# Patient Record
Sex: Male | Born: 1950 | Race: White | Hispanic: No | Marital: Married | Smoking: Former smoker
Health system: Southern US, Community
[De-identification: ages and names within clinical notes are randomized; demographics above are authoritative.]

## PROBLEM LIST (undated history)

## (undated) DIAGNOSIS — E782 Mixed hyperlipidemia: Secondary | ICD-10-CM

## (undated) DIAGNOSIS — N200 Calculus of kidney: Secondary | ICD-10-CM

## (undated) DIAGNOSIS — I2510 Atherosclerotic heart disease of native coronary artery without angina pectoris: Secondary | ICD-10-CM

## (undated) DIAGNOSIS — N183 Chronic kidney disease, stage 3 unspecified: Secondary | ICD-10-CM

## (undated) DIAGNOSIS — I213 ST elevation (STEMI) myocardial infarction of unspecified site: Secondary | ICD-10-CM

## (undated) DIAGNOSIS — R569 Unspecified convulsions: Secondary | ICD-10-CM

## (undated) DIAGNOSIS — E119 Type 2 diabetes mellitus without complications: Secondary | ICD-10-CM

## (undated) DIAGNOSIS — I214 Non-ST elevation (NSTEMI) myocardial infarction: Secondary | ICD-10-CM

## (undated) HISTORY — DX: Mixed hyperlipidemia: E78.2

## (undated) HISTORY — DX: Unspecified convulsions: R56.9

## (undated) HISTORY — DX: Calculus of kidney: N20.0

## (undated) HISTORY — DX: Chronic kidney disease, stage 3 unspecified: N18.30

## (undated) HISTORY — DX: Atherosclerotic heart disease of native coronary artery without angina pectoris: I25.10

## (undated) HISTORY — DX: ST elevation (STEMI) myocardial infarction of unspecified site: I21.3

## (undated) HISTORY — PX: CORONARY ANGIOPLASTY WITH STENT PLACEMENT: SHX49

## (undated) SURGERY — CORONARY ARTERY BYPASS GRAFTING (CABG)
Anesthesia: General | Site: Chest

---

## 1999-10-01 ENCOUNTER — Inpatient Hospital Stay (HOSPITAL_COMMUNITY): Admission: AD | Admit: 1999-10-01 | Discharge: 1999-10-03 | Payer: Self-pay | Admitting: Cardiology

## 1999-10-01 ENCOUNTER — Encounter: Payer: Self-pay | Admitting: Cardiology

## 1999-10-20 ENCOUNTER — Encounter (HOSPITAL_COMMUNITY): Admission: RE | Admit: 1999-10-20 | Discharge: 2000-01-18 | Payer: Self-pay | Admitting: Cardiology

## 2005-01-19 ENCOUNTER — Ambulatory Visit: Payer: Self-pay | Admitting: Internal Medicine

## 2005-10-29 ENCOUNTER — Ambulatory Visit: Payer: Self-pay | Admitting: Internal Medicine

## 2005-12-10 ENCOUNTER — Ambulatory Visit: Payer: Self-pay | Admitting: Internal Medicine

## 2007-09-11 DIAGNOSIS — I25810 Atherosclerosis of coronary artery bypass graft(s) without angina pectoris: Secondary | ICD-10-CM

## 2007-09-11 DIAGNOSIS — Z951 Presence of aortocoronary bypass graft: Secondary | ICD-10-CM | POA: Insufficient documentation

## 2007-09-11 DIAGNOSIS — E1169 Type 2 diabetes mellitus with other specified complication: Secondary | ICD-10-CM | POA: Insufficient documentation

## 2007-09-11 DIAGNOSIS — E785 Hyperlipidemia, unspecified: Secondary | ICD-10-CM

## 2008-12-16 ENCOUNTER — Telehealth (INDEPENDENT_AMBULATORY_CARE_PROVIDER_SITE_OTHER): Payer: Self-pay | Admitting: *Deleted

## 2008-12-26 ENCOUNTER — Ambulatory Visit: Payer: Self-pay | Admitting: Internal Medicine

## 2008-12-26 DIAGNOSIS — H918X9 Other specified hearing loss, unspecified ear: Secondary | ICD-10-CM

## 2008-12-31 LAB — CONVERTED CEMR LAB
ALT: 40 units/L (ref 0–53)
AST: 30 units/L (ref 0–37)
Albumin: 4.1 g/dL (ref 3.5–5.2)
Alkaline Phosphatase: 69 units/L (ref 39–117)
BUN: 12 mg/dL (ref 6–23)
Bilirubin, Direct: 0.1 mg/dL (ref 0.0–0.3)
CO2: 27 meq/L (ref 19–32)
Calcium: 9.6 mg/dL (ref 8.4–10.5)
Chloride: 103 meq/L (ref 96–112)
Cholesterol: 123 mg/dL (ref 0–200)
Creatinine, Ser: 1 mg/dL (ref 0.4–1.5)
Direct LDL: 59.3 mg/dL
GFR calc Af Amer: 99 mL/min
GFR calc non Af Amer: 82 mL/min
Glucose, Bld: 103 mg/dL — ABNORMAL HIGH (ref 70–99)
HDL: 24.8 mg/dL — ABNORMAL LOW (ref >39.0)
Potassium: 3.8 meq/L (ref 3.5–5.1)
Sodium: 142 meq/L (ref 135–145)
Total Bilirubin: 0.9 mg/dL (ref 0.3–1.2)
Total CHOL/HDL Ratio: 5
Total Protein: 7.1 g/dL (ref 6.0–8.3)
Triglycerides: 305 mg/dL (ref 0–149)
VLDL: 61 mg/dL — ABNORMAL HIGH (ref 0–40)

## 2009-01-08 ENCOUNTER — Ambulatory Visit: Payer: Self-pay | Admitting: Internal Medicine

## 2009-01-15 ENCOUNTER — Ambulatory Visit: Payer: Self-pay | Admitting: Internal Medicine

## 2009-01-27 ENCOUNTER — Ambulatory Visit: Payer: Self-pay | Admitting: Internal Medicine

## 2010-07-06 ENCOUNTER — Ambulatory Visit: Payer: Self-pay | Admitting: Internal Medicine

## 2010-07-06 LAB — CONVERTED CEMR LAB
ALT: 27 units/L (ref 0–53)
AST: 19 units/L (ref 0–37)
Albumin: 4 g/dL (ref 3.5–5.2)
Alkaline Phosphatase: 70 units/L (ref 39–117)
BUN: 13 mg/dL (ref 6–23)
Basophils Absolute: 0 10*3/uL (ref 0.0–0.1)
Basophils Relative: 0.5 % (ref 0.0–3.0)
Bilirubin Urine: NEGATIVE
Bilirubin, Direct: 0.2 mg/dL (ref 0.0–0.3)
Blood in Urine, dipstick: NEGATIVE
CO2: 31 meq/L (ref 19–32)
Calcium: 9.2 mg/dL (ref 8.4–10.5)
Chloride: 107 meq/L (ref 96–112)
Cholesterol: 174 mg/dL (ref 0–200)
Creatinine, Ser: 0.9 mg/dL (ref 0.4–1.5)
Direct LDL: 69.5 mg/dL
Eosinophils Absolute: 0.4 10*3/uL (ref 0.0–0.7)
Eosinophils Relative: 6.7 % — ABNORMAL HIGH (ref 0.0–5.0)
GFR calc non Af Amer: 93.35 mL/min (ref >60)
Glucose, Bld: 120 mg/dL — ABNORMAL HIGH (ref 70–99)
Glucose, Urine, Semiquant: NEGATIVE
HCT: 49.9 % (ref 39.0–52.0)
HDL: 36.1 mg/dL — ABNORMAL LOW (ref >39.00)
Hemoglobin: 17.1 g/dL — ABNORMAL HIGH (ref 13.0–17.0)
Ketones, urine, test strip: NEGATIVE
Lymphocytes Relative: 23.7 % (ref 12.0–46.0)
Lymphs Abs: 1.6 10*3/uL (ref 0.7–4.0)
MCHC: 34.2 g/dL (ref 30.0–36.0)
MCV: 90.3 fL (ref 78.0–100.0)
Monocytes Absolute: 0.5 10*3/uL (ref 0.1–1.0)
Monocytes Relative: 7.9 % (ref 3.0–12.0)
Neutro Abs: 4 10*3/uL (ref 1.4–7.7)
Neutrophils Relative %: 61.2 % (ref 43.0–77.0)
Nitrite: NEGATIVE
PSA: 3.59 ng/mL (ref 0.10–4.00)
Platelets: 249 10*3/uL (ref 150.0–400.0)
Potassium: 4.8 meq/L (ref 3.5–5.1)
Protein, U semiquant: NEGATIVE
RBC: 5.52 M/uL (ref 4.22–5.81)
RDW: 12.8 % (ref 11.5–14.6)
Sodium: 141 meq/L (ref 135–145)
Specific Gravity, Urine: >=1.03
TSH: 3.14 microintl units/mL (ref 0.35–5.50)
Total Bilirubin: 0.7 mg/dL (ref 0.3–1.2)
Total CHOL/HDL Ratio: 5
Total Protein: 7.1 g/dL (ref 6.0–8.3)
Triglycerides: 561 mg/dL — ABNORMAL HIGH (ref 0.0–149.0)
Urobilinogen, UA: 0.2
VLDL: 112.2 mg/dL — ABNORMAL HIGH (ref 0.0–40.0)
WBC Urine, dipstick: NEGATIVE
WBC: 6.6 10*3/uL (ref 4.5–10.5)
pH: 5

## 2010-07-13 ENCOUNTER — Ambulatory Visit: Payer: Self-pay | Admitting: Internal Medicine

## 2011-01-19 NOTE — Assessment & Plan Note (Signed)
Summary: cpx/ccm   Vital Signs:  Patient profile:   60 year old male Height:      68 inches Weight:      209 pounds BMI:     31.89 Temp:     98.7 degrees F oral Pulse rate:   66 / minute Pulse rhythm:   regular BP sitting:   120 / 88  (left arm)  Vitals Entered By: Kern Reap CMA Duncan Dull) (July 13, 2010 8:01 AM)  Nutrition Counseling: [name] BMI is greater than 25 and therefore counseled on weight management options. CC: yearly physical Is Patient Diabetic? No Pain Assessment Patient in pain? no        CC:  yearly physical.  History of Present Illness: CPX  Preventive Screening-Counseling & Management  Alcohol-Tobacco     Smoking Status: quit  Current Problems (verified): 1)  Other Specified Forms of Hearing Loss  (ICD-389.8) 2)  Hyperlipidemia  (ICD-272.4) 3)  Coronary Artery Disease  (ICD-414.00) 4)  Family History of Aneurysm Aortic  (ICD-V17.4)  Current Medications (verified): 1)  Vytorin 10-80 Mg Tabs (Ezetimibe-Simvastatin) .... Take 1 Tablet By Mouth At Bedtime 2)  Bayer Aspirin 325 Mg  Tabs (Aspirin) .... Once Daily  Allergies (verified): No Known Drug Allergies  Past History:  Past Medical History: Last updated: 09/11/2007 Kidney Stones Coronary artery disease Hyperlipidemia  Past Surgical History: Last updated: 09/11/2007 Angioplasty  Family History: Last updated: 12/26/2008 Family History of Aneurysm Aortic father deceased (knife wound) mother MVA  Social History: Last updated: 07/13/2010 Occupation: carpenter Married  Smoker (quit 3009) Alcohol use-no Former Smoker  Risk Factors: Smoking Status: quit (07/13/2010)  Social History: Occupation: Music therapist Married  Smoker (quit 3009) Alcohol use-no Former Smoker Smoking Status:  quit  Physical Exam  General:  alert and well-developed.   Head:  normocephalic and atraumatic.   Eyes:  pupils equal and pupils round.   Ears:  R ear normal and L ear normal.   Nose:  no  external deformity and no external erythema.   Mouth:  good dentition and pharynx pink and moist.   Neck:  No deformities, masses, or tenderness noted. Chest Wall:  No deformities, masses, tenderness or gynecomastia noted. Lungs:  normal respiratory effort and no intercostal retractions.   Heart:  normal rate and regular rhythm.   Abdomen:  soft and non-tender.   Prostate:  no nodules and no asymmetry.   Msk:  No deformity or scoliosis noted of thoracic or lumbar spine.   Extremities:  trace left pedal edema.   Neurologic:  cranial nerves II-XII intact and gait normal.     Impression & Recommendations:  Problem # 1:  PREVENTIVE HEALTH CARE (ICD-V70.0) no insurance---stool cards for colon CA screening  Problem # 2:  HYPERLIPIDEMIA (ICD-272.4) adequate control continue current medications  His updated medication list for this problem includes:    Vytorin 10-80 Mg Tabs (Ezetimibe-simvastatin) .Marland Kitchen... Take 1 tablet by mouth at bedtime  Labs Reviewed: SGOT: 19 (07/06/2010)   SGPT: 27 (07/06/2010)   HDL:36.10 (07/06/2010), 24.8 (12/26/2008)  LDL:DEL (12/26/2008)  Chol:174 (07/06/2010), 123 (12/26/2008)  Trig:561.0 Triglyceride is over 400; calculations on Lipids are invalid. mg/dL (16/09/9603), 540 (98/10/9146)  Problem # 3:  CORONARY ARTERY DISEASE (ICD-414.00) no sxs continue current medications  His updated medication list for this problem includes:    Bayer Aspirin 325 Mg Tabs (Aspirin) ..... Once daily  Complete Medication List: 1)  Vytorin 10-80 Mg Tabs (Ezetimibe-simvastatin) .... Take 1 tablet by mouth at bedtime 2)  Bayer  Aspirin 325 Mg Tabs (Aspirin) .... Once daily  Other Orders: Tdap => 56yrs IM (16109) Admin 1st Vaccine (60454)  Contraindications/Deferment of Procedures/Staging:    Test/Procedure: Colonoscopy    Reason for deferment: declined-financial    Immunizations Administered:  Tetanus Vaccine:    Vaccine Type: Tdap    Site: left deltoid    Mfr:  GlaxoSmithKline    Dose: 0.5 ml    Route: IM    Given by: Kern Reap CMA (AAMA)    Exp. Date: 01/08/2012    Lot #: [id number]    Physician counseled: yes

## 2011-02-12 ENCOUNTER — Other Ambulatory Visit: Payer: Self-pay | Admitting: Internal Medicine

## 2012-01-04 ENCOUNTER — Other Ambulatory Visit: Payer: Self-pay | Admitting: Internal Medicine

## 2012-01-05 ENCOUNTER — Telehealth: Payer: Self-pay | Admitting: Internal Medicine

## 2012-01-05 MED ORDER — EZETIMIBE-SIMVASTATIN 10-80 MG PO TABS: 1.0000 | ORAL_TABLET | Freq: Every day | ORAL | Status: DC

## 2012-01-05 NOTE — Telephone Encounter (Signed)
rx sent in electronically 

## 2012-01-05 NOTE — Telephone Encounter (Signed)
Refill Vytorin until his appt next week.   [facility]. Thanks.

## 2012-01-06 ENCOUNTER — Ambulatory Visit: Payer: Self-pay | Admitting: Internal Medicine

## 2012-01-12 ENCOUNTER — Encounter: Payer: Self-pay | Admitting: Internal Medicine

## 2012-01-12 ENCOUNTER — Ambulatory Visit (INDEPENDENT_AMBULATORY_CARE_PROVIDER_SITE_OTHER): Payer: Self-pay | Admitting: Internal Medicine

## 2012-01-12 DIAGNOSIS — I2510 Atherosclerotic heart disease of native coronary artery without angina pectoris: Secondary | ICD-10-CM

## 2012-01-12 DIAGNOSIS — E785 Hyperlipidemia, unspecified: Secondary | ICD-10-CM

## 2012-01-12 LAB — BASIC METABOLIC PANEL
BUN: 15 mg/dL (ref 6–23)
CO2: 26 mEq/L (ref 19–32)
Chloride: 106 mEq/L (ref 96–112)
Creatinine, Ser: 1 mg/dL (ref 0.4–1.5)
Glucose, Bld: 133 mg/dL — ABNORMAL HIGH (ref 70–99)

## 2012-01-12 LAB — LIPID PANEL
Cholesterol: 140 mg/dL (ref 0–200)
HDL: 34.3 mg/dL — ABNORMAL LOW (ref >39.00)
Total CHOL/HDL Ratio: 4
Triglycerides: 278 mg/dL — ABNORMAL HIGH (ref 0.0–149.0)
VLDL: 55.6 mg/dL — ABNORMAL HIGH (ref 0.0–40.0)

## 2012-01-12 LAB — HEPATIC FUNCTION PANEL
ALT: 25 U/L (ref 0–53)
AST: 20 U/L (ref 0–37)
Albumin: 4.2 g/dL (ref 3.5–5.2)
Alkaline Phosphatase: 75 U/L (ref 39–117)
Bilirubin, Direct: 0.1 mg/dL (ref 0.0–0.3)
Total Bilirubin: 0.9 mg/dL (ref 0.3–1.2)
Total Protein: 7 g/dL (ref 6.0–8.3)

## 2012-01-12 NOTE — Assessment & Plan Note (Signed)
No sxs Continue current meds---risk factor modification

## 2012-01-12 NOTE — Assessment & Plan Note (Signed)
Needs labwork

## 2012-01-17 ENCOUNTER — Encounter: Payer: Self-pay | Admitting: *Deleted

## 2012-01-18 NOTE — Progress Notes (Signed)
Patient ID: Adrian Foley, male   DOB: 07-24-1951, 61 y.o.   MRN: [id number] Cad---no cp, sob, pnd, orhtopnea He is physically active with work  Past Medical History  Diagnosis Date  . Kidney stones   . CAD (coronary artery disease)   . Hyperlipidemia     History   Social History  . Marital Status: Single    Spouse Name: N/A    Number of Children: N/A  . Years of Education: N/A   Occupational History  . Not on file.   Social History Main Topics  . Smoking status: Former Smoker    Quit date: 12/21/2007  . Smokeless tobacco: Not on file  . Alcohol Use: No  . Drug Use: No  . Sexually Active:    Other Topics Concern  . Not on file   Social History Narrative  . No narrative on file    Past Surgical History  Procedure Date  . Angioplasty     Family History  Problem Relation Age of Onset  . Heart disease Maternal Aunt     No Known Allergies  No current outpatient prescriptions on file prior to visit.     patient denies chest pain, shortness of breath, orthopnea. Denies lower extremity edema, abdominal pain, change in appetite, change in bowel movements. Patient denies rashes, musculoskeletal complaints. No other specific complaints in a complete review of systems.   BP 132/90  Pulse 64  Temp(Src) 98.1 F (36.7 C) (Oral)  Ht 5\' 9"  (1.753 m)  Wt 207 lb (93.895 kg)  BMI 30.57 kg/m2  well-developed well-nourished male in no acute distress. HEENT exam atraumatic, normocephalic, neck supple without jugular venous distention. Chest clear to auscultation cardiac exam S1-S2 are regular. Abdominal exam overweight with bowel sounds, soft and nontender. Extremities no edema. Neurologic exam is alert with a normal gait.

## 2012-03-16 ENCOUNTER — Other Ambulatory Visit: Payer: Self-pay | Admitting: Internal Medicine

## 2012-05-23 ENCOUNTER — Other Ambulatory Visit: Payer: Self-pay | Admitting: Internal Medicine

## 2012-07-26 ENCOUNTER — Other Ambulatory Visit: Payer: Self-pay | Admitting: Internal Medicine

## 2012-09-28 ENCOUNTER — Other Ambulatory Visit: Payer: Self-pay | Admitting: Internal Medicine

## 2012-12-01 ENCOUNTER — Other Ambulatory Visit: Payer: Self-pay | Admitting: Internal Medicine

## 2013-01-29 ENCOUNTER — Other Ambulatory Visit: Payer: Self-pay | Admitting: Internal Medicine

## 2013-04-08 ENCOUNTER — Other Ambulatory Visit: Payer: Self-pay | Admitting: Internal Medicine

## 2013-04-13 ENCOUNTER — Other Ambulatory Visit: Payer: Self-pay | Admitting: Internal Medicine

## 2013-06-13 ENCOUNTER — Other Ambulatory Visit: Payer: Self-pay | Admitting: Internal Medicine

## 2013-06-15 ENCOUNTER — Other Ambulatory Visit: Payer: Self-pay | Admitting: Internal Medicine

## 2013-08-29 ENCOUNTER — Other Ambulatory Visit: Payer: Self-pay | Admitting: Internal Medicine

## 2013-09-01 ENCOUNTER — Other Ambulatory Visit: Payer: Self-pay | Admitting: Internal Medicine

## 2013-11-07 ENCOUNTER — Other Ambulatory Visit: Payer: Self-pay | Admitting: Internal Medicine

## 2013-11-09 ENCOUNTER — Other Ambulatory Visit: Payer: Self-pay | Admitting: Internal Medicine

## 2013-11-14 ENCOUNTER — Other Ambulatory Visit: Payer: Self-pay | Admitting: Internal Medicine

## 2013-11-16 ENCOUNTER — Ambulatory Visit (INDEPENDENT_AMBULATORY_CARE_PROVIDER_SITE_OTHER): Payer: Self-pay | Admitting: Internal Medicine

## 2013-11-16 ENCOUNTER — Encounter: Payer: Self-pay | Admitting: Internal Medicine

## 2013-11-16 VITALS — BP 132/86 | HR 55 | Temp 97.8°F | Resp 20 | Wt 203.0 lb

## 2013-11-16 DIAGNOSIS — Z0000 Encounter for general adult medical examination without abnormal findings: Secondary | ICD-10-CM

## 2013-11-16 DIAGNOSIS — R7309 Other abnormal glucose: Secondary | ICD-10-CM

## 2013-11-16 DIAGNOSIS — R739 Hyperglycemia, unspecified: Secondary | ICD-10-CM

## 2013-11-16 DIAGNOSIS — H918X9 Other specified hearing loss, unspecified ear: Secondary | ICD-10-CM

## 2013-11-16 DIAGNOSIS — I2510 Atherosclerotic heart disease of native coronary artery without angina pectoris: Secondary | ICD-10-CM

## 2013-11-16 DIAGNOSIS — E785 Hyperlipidemia, unspecified: Secondary | ICD-10-CM

## 2013-11-16 LAB — HEPATIC FUNCTION PANEL
ALT: 26 U/L (ref 0–53)
AST: 15 U/L (ref 0–37)
Albumin: 3.7 g/dL (ref 3.5–5.2)
Alkaline Phosphatase: 71 U/L (ref 39–117)

## 2013-11-16 LAB — CBC WITH DIFFERENTIAL/PLATELET
Basophils Relative: 0.8 % (ref 0.0–3.0)
Eosinophils Absolute: 0.3 10*3/uL (ref 0.0–0.7)
HCT: 50.3 % (ref 39.0–52.0)
Hemoglobin: 17 g/dL (ref 13.0–17.0)
Lymphocytes Relative: 25.1 % (ref 12.0–46.0)
Lymphs Abs: 1.4 10*3/uL (ref 0.7–4.0)
MCHC: 33.8 g/dL (ref 30.0–36.0)
MCV: 89.4 fl (ref 78.0–100.0)
Monocytes Absolute: 0.6 10*3/uL (ref 0.1–1.0)
Neutro Abs: 3.2 10*3/uL (ref 1.4–7.7)
RBC: 5.64 Mil/uL (ref 4.22–5.81)
RDW: 12.4 % (ref 11.5–14.6)
WBC: 5.5 10*3/uL (ref 4.5–10.5)

## 2013-11-16 LAB — LDL CHOLESTEROL, DIRECT: Direct LDL: 81.9 mg/dL

## 2013-11-16 LAB — LIPID PANEL: VLDL: 56.6 mg/dL — ABNORMAL HIGH (ref 0.0–40.0)

## 2013-11-16 LAB — CK: Total CK: 57 U/L (ref 7–232)

## 2013-11-16 LAB — BASIC METABOLIC PANEL
BUN: 14 mg/dL (ref 6–23)
Chloride: 105 mEq/L (ref 96–112)
Glucose, Bld: 173 mg/dL — ABNORMAL HIGH (ref 70–99)
Potassium: 4 mEq/L (ref 3.5–5.1)

## 2013-11-16 LAB — TSH: TSH: 2.42 u[IU]/mL (ref 0.35–5.50)

## 2013-11-16 NOTE — Progress Notes (Signed)
Pre-visit discussion using our clinic review tool. No additional management support is needed unless otherwise documented below in the visit note.  

## 2013-11-16 NOTE — Progress Notes (Signed)
CAD- no sxs  Lipids- tolerating meds  May have some diffuse "aches and pains"- he attributes to "old age"  Reviewed pmh, psh, meds  Ros- no compliants except for above  Exam BP 132/86  Pulse 55  Temp(Src) 97.8 F (36.6 C) (Oral)  Resp 20  Wt 203 lb (92.08 kg)  SpO2 98%  well-developed well-nourished male in no acute distress. HEENT exam atraumatic, normocephalic, neck supple without jugular venous distention. Chest clear to auscultation cardiac exam S1-S2 are regular. Abdominal exam overweight with bowel sounds, soft and nontender. Extremities no edema. Neurologic exam is alert with a normal gait.

## 2013-11-18 NOTE — Assessment & Plan Note (Signed)
Continue meds Needs f/u labs

## 2013-11-18 NOTE — Assessment & Plan Note (Signed)
No sxs- continue risk factor modification

## 2013-11-20 ENCOUNTER — Other Ambulatory Visit: Payer: Self-pay | Admitting: Internal Medicine

## 2013-11-20 DIAGNOSIS — E119 Type 2 diabetes mellitus without complications: Secondary | ICD-10-CM

## 2013-11-20 MED ORDER — METFORMIN HCL 500 MG PO TABS: 500.0000 mg | ORAL_TABLET | Freq: Two times a day (BID) | ORAL | Status: DC

## 2013-12-04 ENCOUNTER — Other Ambulatory Visit: Payer: Self-pay | Admitting: *Deleted

## 2013-12-04 MED ORDER — EZETIMIBE-SIMVASTATIN 10-80 MG PO TABS: ORAL_TABLET | ORAL | Status: DC

## 2013-12-20 LAB — HM DIABETES EYE EXAM

## 2014-01-29 ENCOUNTER — Encounter: Payer: Self-pay | Admitting: Internal Medicine

## 2014-02-18 ENCOUNTER — Encounter: Payer: Self-pay | Admitting: Internal Medicine

## 2014-02-18 DIAGNOSIS — E1159 Type 2 diabetes mellitus with other circulatory complications: Secondary | ICD-10-CM | POA: Insufficient documentation

## 2014-02-18 LAB — HM DIABETES FOOT EXAM

## 2014-02-18 NOTE — Progress Notes (Signed)
Patient comes in with known coronary artery disease. He has history of angioplasty. He has no chest pain, shortness of breath, PND, orthopnea. No dyspnea on exertion.  Patient is hyperlipidemia tolerating Vytorin.   Patient also has type 2 diabetes and is tolerating metformin. Lab Results  Component Value Date   HGBA1C 7.9* 11/16/2013    Past Medical History  Diagnosis Date  . Kidney stones   . CAD (coronary artery disease)   . Hyperlipidemia     History   Social History  . Marital Status: Single    Spouse Name: N/A    Number of Children: N/A  . Years of Education: N/A   Occupational History  . Not on file.   Social History Main Topics  . Smoking status: Former Smoker    Quit date: 12/21/2007  . Smokeless tobacco: Not on file  . Alcohol Use: No  . Drug Use: No  . Sexual Activity:    Other Topics Concern  . Not on file   Social History Narrative  . No narrative on file    Past Surgical History  Procedure Laterality Date  . Angioplasty      Family History  Problem Relation Age of Onset  . Heart disease Maternal Aunt     No Known Allergies  Current Outpatient Prescriptions on File Prior to Visit  Medication Sig Dispense Refill  . aspirin 325 MG tablet Take 325 mg by mouth daily.      Marland Kitchen. ezetimibe-simvastatin (VYTORIN) 10-80 MG per tablet TAKE 1 TABLET BY MOUTH AT BEDTIME  30 tablet  11  . metFORMIN (GLUCOPHAGE) 500 MG tablet Take 1 tablet (500 mg total) by mouth 2 (two) times daily with a meal.  180 tablet  3   No current facility-administered medications on file prior to visit.     patient denies chest pain, shortness of breath, orthopnea. Denies lower extremity edema, abdominal pain, change in appetite, change in bowel movements. Patient denies rashes, musculoskeletal complaints. No other specific complaints in a complete review of systems.   Reviewed vitals  well-developed well-nourished male in no acute distress. HEENT exam atraumatic, normocephalic,  neck supple without jugular venous distention. Chest clear to auscultation cardiac exam S1-S2 are regular. Abdominal exam overweight with bowel sounds, soft and nontender. Extremities no edema. Neurologic exam is alert with a normal gait.  This encounter was created in error - please disregard.

## 2014-03-21 ENCOUNTER — Ambulatory Visit (INDEPENDENT_AMBULATORY_CARE_PROVIDER_SITE_OTHER): Payer: Self-pay | Admitting: Family Medicine

## 2014-03-21 ENCOUNTER — Encounter: Payer: Self-pay | Admitting: Family Medicine

## 2014-03-21 VITALS — BP 130/80 | Temp 98.2°F | Wt 184.0 lb

## 2014-03-21 DIAGNOSIS — K4090 Unilateral inguinal hernia, without obstruction or gangrene, not specified as recurrent: Secondary | ICD-10-CM

## 2014-03-21 NOTE — Progress Notes (Addendum)
Chief Complaint  Patient presents with  . Groin Pain    possible hernia?    HPI:  ? Hernia: -R groin pain and bulge -started last week after heavy lifting at work -recurs with strenuous activity and lifting -reducible bulge in R groin and interminttent pain -denies: abd pain, bowel changes, hematochezia, urinary symptoms, testicular pain or swelling ROS: See pertinent positives and negatives per HPI.  Past Medical History  Diagnosis Date  . Kidney stones   . CAD (coronary artery disease)   . Hyperlipidemia     Past Surgical History  Procedure Laterality Date  . Angioplasty      Family History  Problem Relation Age of Onset  . Heart disease Maternal Aunt     History   Social History  . Marital Status: Single    Spouse Name: N/A    Number of Children: N/A  . Years of Education: N/A   Social History Main Topics  . Smoking status: Former Smoker    Quit date: 12/21/2007  . Smokeless tobacco: None  . Alcohol Use: No  . Drug Use: No  . Sexual Activity:    Other Topics Concern  . None   Social History Narrative  . None    Current outpatient prescriptions:aspirin 325 MG tablet, Take 325 mg by mouth daily., Disp: , Rfl:   EXAM:  Filed Vitals:   03/21/14 0905  BP: 130/80  Temp: 98.2 F (36.8 C)    Body mass index is 27.16 kg/(m^2).  GENERAL: vitals reviewed and listed above, alert, oriented, appears well hydrated and in no acute distress  HEENT: atraumatic, conjunttiva clear, no obvious abnormalities on inspection of external nose and ears  NECK: no obvious masses on inspection  GU: reducible bulge R groin, normal otherwise  MS: moves all extremities without noticeable abnormality  PSYCH: pleasant and cooperative, no obvious depression or anxiety  ASSESSMENT AND PLAN:  Discussed the following assessment and plan:  Inguinal hernia - Plan: Ambulatory referral to General Surgery  -discussed implications, options, return and emergent  precautions -he would like to see surgeon asap for eval and tx given work limitations related to this issue -Patient advised to return or notify a doctor immediately if symptoms worsen or persist or new concerns arise.  Patient Instructions  Inguinal Hernia, Adult Muscles help keep everything in the body in its proper place. But if a weak spot in the muscles develops, something can poke through. That is called a hernia. When this happens in the lower part of the belly (abdomen), it is called an inguinal hernia. (It takes its name from a part of the body in this region called the inguinal canal.) A weak spot in the wall of muscles lets some fat or part of the small intestine bulge through. An inguinal hernia can develop at any age. Men get them more often than women. CAUSES  In adults, an inguinal hernia develops over time.  It can be triggered by:  Suddenly straining the muscles of the lower abdomen.  Lifting heavy objects.  Straining to have a bowel movement. Difficult bowel movements (constipation) can lead to this.  Constant coughing. This may be caused by smoking or lung disease.  Being overweight.  Being pregnant.  Working at a job that requires long periods of standing or heavy lifting.  Having had an inguinal hernia before. One type can be an emergency situation. It is called a strangulated inguinal hernia. It develops if part of the small intestine slips through the  weak spot and cannot get back into the abdomen. The blood supply can be cut off. If that happens, part of the intestine may die. This situation requires emergency surgery. SYMPTOMS  Often, a small inguinal hernia has no symptoms. It is found when a healthcare provider does a physical exam. Larger hernias usually have symptoms.   In adults, symptoms may include:  A lump in the groin. This is easier to see when the person is standing. It might disappear when lying down.  In men, a lump in the scrotum.  Pain or  burning in the groin. This occurs especially when lifting, straining or coughing.  A dull ache or feeling of pressure in the groin.  Signs of a strangulated hernia can include:  A bulge in the groin that becomes very painful and tender to the touch.  A bulge that turns red or purple.  Fever, nausea and vomiting.  Inability to have a bowel movement or to pass gas. DIAGNOSIS  To decide if you have an inguinal hernia, a healthcare provider will probably do a physical examination.  This will include asking questions about any symptoms you have noticed.  The healthcare provider might feel the groin area and ask you to cough. If an inguinal hernia is felt, the healthcare provider may try to slide it back into the abdomen.  Usually no other tests are needed. TREATMENT  Treatments can vary. The size of the hernia makes a difference. Options include:  Watchful waiting. This is often suggested if the hernia is small and you have had no symptoms.  No medical procedure will be done unless symptoms develop.  You will need to watch closely for symptoms. If any occur, contact your healthcare provider right away.  Surgery. This is used if the hernia is larger or you have symptoms.  Open surgery. This is usually an outpatient procedure (you will not stay overnight in a hospital). An cut (incision) is made through the skin in the groin. The hernia is put back inside the abdomen. The weak area in the muscles is then repaired by herniorrhaphy or hernioplasty. Herniorrhaphy: in this type of surgery, the weak muscles are sewn back together. Hernioplasty: a patch or mesh is used to close the weak area in the abdominal wall.  Laparoscopy. In this procedure, a surgeon makes small incisions. A thin tube with a tiny video camera (called a laparoscope) is put into the abdomen. The surgeon repairs the hernia with mesh by looking with the video camera and using two long instruments. HOME CARE INSTRUCTIONS    After surgery to repair an inguinal hernia:  You will need to take pain medicine prescribed by your healthcare provider. Follow all directions carefully.  You will need to take care of the wound from the incision.  Your activity will be restricted for awhile. This will probably include no heavy lifting for several weeks. You also should not do anything too active for a few weeks. When you can return to work will depend on the type of job that you have.  During "watchful waiting" periods, you should:  Maintain a healthy weight.  Eat a diet high in fiber (fruits, vegetables and whole grains).  Drink plenty of fluids to avoid constipation. This means drinking enough water and other liquids to keep your urine clear or pale yellow.  Do not lift heavy objects.  Do not stand for long periods of time.  Quit smoking. This should keep you from developing a frequent cough. SEEK MEDICAL  CARE IF:   A bulge develops in your groin area.  You feel pain, a burning sensation or pressure in the groin. This might be worse if you are lifting or straining.  You develop a fever of more than 100.5 F (38.1 C). SEEK IMMEDIATE MEDICAL CARE IF:   Pain in the groin increases suddenly.  A bulge in the groin gets bigger suddenly and does not go down.  For men, there is sudden pain in the scrotum. Or, the size of the scrotum increases.  A bulge in the groin area becomes red or purple and is painful to touch.  You have nausea or vomiting that does not go away.  You feel your heart beating much faster than normal.  You cannot have a bowel movement or pass gas.  You develop a fever of more than 102.0 F (38.9 C). Document Released: 04/24/2009 Document Revised: 02/28/2012 Document Reviewed: 04/24/2009 [facility] Patient Information 2014 [place], Lona Kettle, Dahlia Client R.

## 2014-03-21 NOTE — Progress Notes (Signed)
Pre visit review using our clinic review tool, if applicable. No additional management support is needed unless otherwise documented below in the visit note. 

## 2014-03-21 NOTE — Patient Instructions (Signed)

## 2014-04-05 ENCOUNTER — Ambulatory Visit (INDEPENDENT_AMBULATORY_CARE_PROVIDER_SITE_OTHER): Payer: Self-pay | Admitting: Surgery

## 2015-10-06 ENCOUNTER — Emergency Department (HOSPITAL_COMMUNITY): Payer: Self-pay

## 2015-10-06 ENCOUNTER — Encounter (HOSPITAL_COMMUNITY): Payer: Self-pay | Admitting: Emergency Medicine

## 2015-10-06 ENCOUNTER — Encounter (HOSPITAL_COMMUNITY): Payer: Self-pay | Admitting: Family Medicine

## 2015-10-06 ENCOUNTER — Emergency Department (HOSPITAL_COMMUNITY)
Admission: EM | Admit: 2015-10-06 | Discharge: 2015-10-06 | Disposition: A | Discharge status: Home / Self Care | Payer: Self-pay | Attending: Emergency Medicine | Admitting: Emergency Medicine

## 2015-10-06 DIAGNOSIS — Z87891 Personal history of nicotine dependence: Secondary | ICD-10-CM | POA: Insufficient documentation

## 2015-10-06 DIAGNOSIS — Z87442 Personal history of urinary calculi: Secondary | ICD-10-CM | POA: Insufficient documentation

## 2015-10-06 DIAGNOSIS — I2510 Atherosclerotic heart disease of native coronary artery without angina pectoris: Secondary | ICD-10-CM | POA: Insufficient documentation

## 2015-10-06 DIAGNOSIS — Z7982 Long term (current) use of aspirin: Secondary | ICD-10-CM | POA: Insufficient documentation

## 2015-10-06 DIAGNOSIS — Z8639 Personal history of other endocrine, nutritional and metabolic disease: Secondary | ICD-10-CM | POA: Insufficient documentation

## 2015-10-06 DIAGNOSIS — R109 Unspecified abdominal pain: Secondary | ICD-10-CM | POA: Insufficient documentation

## 2015-10-06 DIAGNOSIS — R112 Nausea with vomiting, unspecified: Secondary | ICD-10-CM | POA: Insufficient documentation

## 2015-10-06 DIAGNOSIS — R1031 Right lower quadrant pain: Secondary | ICD-10-CM | POA: Insufficient documentation

## 2015-10-06 LAB — CBC WITH DIFFERENTIAL/PLATELET
BASOS PCT: 0 %
Basophils Absolute: 0 10*3/uL (ref 0.0–0.1)
Eosinophils Absolute: 0.1 10*3/uL (ref 0.0–0.7)
Eosinophils Relative: 2 %
HEMATOCRIT: 48.7 % (ref 39.0–52.0)
HEMOGLOBIN: 17.3 g/dL — AB (ref 13.0–17.0)
LYMPHS ABS: 1 10*3/uL (ref 0.7–4.0)
Lymphocytes Relative: 11 %
MCH: 31.1 pg (ref 26.0–34.0)
MCHC: 35.5 g/dL (ref 30.0–36.0)
MCV: 87.6 fL (ref 78.0–100.0)
MONO ABS: 0.6 10*3/uL (ref 0.1–1.0)
MONOS PCT: 7 %
NEUTROS ABS: 7.1 10*3/uL (ref 1.7–7.7)
Neutrophils Relative %: 80 %
Platelets: 197 10*3/uL (ref 150–400)
RBC: 5.56 MIL/uL (ref 4.22–5.81)
RDW: 12.7 % (ref 11.5–15.5)
WBC: 8.9 10*3/uL (ref 4.0–10.5)

## 2015-10-06 LAB — URINALYSIS, ROUTINE W REFLEX MICROSCOPIC
BILIRUBIN URINE: NEGATIVE
Bilirubin Urine: NEGATIVE
GLUCOSE, UA: NEGATIVE mg/dL
Glucose, UA: NEGATIVE mg/dL
Hgb urine dipstick: NEGATIVE
Hgb urine dipstick: NEGATIVE
KETONES UR: 15 mg/dL — AB
Ketones, ur: NEGATIVE mg/dL
LEUKOCYTES UA: NEGATIVE
Leukocytes, UA: NEGATIVE
Nitrite: NEGATIVE
Nitrite: NEGATIVE
PH: 6 (ref 5.0–8.0)
PH: 5.5 (ref 5.0–8.0)
Protein, ur: NEGATIVE mg/dL
Protein, ur: NEGATIVE mg/dL
SPECIFIC GRAVITY, URINE: 1.022 (ref 1.005–1.030)
SPECIFIC GRAVITY, URINE: 1.018 (ref 1.005–1.030)
Urobilinogen, UA: 0.2 mg/dL (ref 0.0–1.0)
Urobilinogen, UA: 1 mg/dL (ref 0.0–1.0)

## 2015-10-06 LAB — BASIC METABOLIC PANEL
Anion gap: 10 (ref 5–15)
BUN: 15 mg/dL (ref 6–20)
CALCIUM: 9.2 mg/dL (ref 8.9–10.3)
CHLORIDE: 99 mmol/L — AB (ref 101–111)
CO2: 25 mmol/L (ref 22–32)
Creatinine, Ser: 0.89 mg/dL (ref 0.61–1.24)
GFR calc non Af Amer: >60 mL/min (ref >60)
GLUCOSE: 159 mg/dL — AB (ref 65–99)
Potassium: 4.4 mmol/L (ref 3.5–5.1)
Sodium: 134 mmol/L — ABNORMAL LOW (ref 135–145)

## 2015-10-06 MED ORDER — MORPHINE SULFATE (PF) 4 MG/ML IV SOLN
4.0000 mg | Freq: Once | INTRAVENOUS | Status: AC
  Administered 2015-10-06: 4 mg via INTRAVENOUS
  Filled 2015-10-06: qty 1

## 2015-10-06 MED ORDER — ONDANSETRON 4 MG PO TBDP: 4.0000 mg | ORAL_TABLET | Freq: Three times a day (TID) | ORAL | Status: DC | PRN

## 2015-10-06 MED ORDER — ONDANSETRON HCL 4 MG/2ML IJ SOLN
4.0000 mg | Freq: Once | INTRAMUSCULAR | Status: DC
  Filled 2015-10-06: qty 2

## 2015-10-06 MED ORDER — KETOROLAC TROMETHAMINE 30 MG/ML IJ SOLN
30.0000 mg | Freq: Once | INTRAMUSCULAR | Status: AC
  Administered 2015-10-06: 30 mg via INTRAVENOUS
  Filled 2015-10-06: qty 1

## 2015-10-06 MED ORDER — IBUPROFEN 800 MG PO TABS: 800.0000 mg | ORAL_TABLET | Freq: Three times a day (TID) | ORAL | Status: DC

## 2015-10-06 NOTE — ED Notes (Signed)
Pt here for right flank and abd pain that started last night. sts N,V. Hx of kidney stones

## 2015-10-06 NOTE — ED Notes (Signed)
MD at bedside. 

## 2015-10-06 NOTE — ED Notes (Signed)
Pt. refused blood tests at triage .  

## 2015-10-06 NOTE — ED Provider Notes (Signed)
CSN: [id number]     Arrival date & time 10/06/15  0708 History   First MD Initiated Contact with Patient 10/06/15 [phone]     Chief Complaint  Patient presents with  . Flank Pain     (Consider location/radiation/quality/duration/timing/severity/associated sxs/prior Treatment) HPI Comments: Pt is a 64 yo male with PMH of kidney stones and CAD who presents to the ED with complaint of right flank pain, onset 9pm last night. Pt reports having sudden onset constant sharp right flank pain. Denies any aggravating or alleviating factors. Endorses nausea and NBNB vomiting x2 this morning, he notes his N/V have since resolved. Denies fever, chills, HA, SOB, CP, abdominal pain, diarrhea, constipation, dysuria, blood in urine or stool. Pt reports having hx of kidney stones in the past and notes that this episode of pain feels similar to his prior kidney stones. Denies hx of abdominal surgeries.    Past Medical History  Diagnosis Date  . Kidney stones   . CAD (coronary artery disease)   . Hyperlipidemia    Past Surgical History  Procedure Laterality Date  . Angioplasty     Family History  Problem Relation Age of Onset  . Heart disease Maternal Aunt    Social History  Substance Use Topics  . Smoking status: Former Smoker    Quit date: 12/21/2007  . Smokeless tobacco: None  . Alcohol Use: No    Review of Systems  Gastrointestinal: Positive for nausea and vomiting.  Genitourinary: Positive for flank pain.  All other systems reviewed and are negative.     Allergies  Review of patient's allergies indicates no known allergies.  Home Medications   Prior to Admission medications   Medication Sig Start Date End Date Taking? Authorizing Provider  aspirin 325 MG tablet Take 325 mg by mouth daily.    Historical Provider, MD   BP 168/86 mmHg  Pulse 57  Temp(Src) 97.6 F (36.4 C)  Resp 18  Ht  (1.753 m)  Wt 195 lb (88.451 kg)  BMI 28.78 kg/m2  SpO2 98% Physical Exam   Constitutional: He is oriented to person, place, and time. He appears well-developed and well-nourished.  HENT:  Head: Normocephalic and atraumatic.  Mouth/Throat: Oropharynx is clear and moist. No oropharyngeal exudate.  Eyes: Conjunctivae and EOM are normal. Right eye exhibits no discharge. Left eye exhibits no discharge. No scleral icterus.  Neck: Normal range of motion. Neck supple.  Cardiovascular: Normal rate, regular rhythm, normal heart sounds and intact distal pulses.   Pulmonary/Chest: Effort normal and breath sounds normal. No respiratory distress. He has no wheezes. He has no rales. He exhibits no tenderness.  Abdominal: Soft. Bowel sounds are normal. He exhibits no distension and no mass. There is no tenderness. There is no rebound, no guarding, no CVA tenderness, no tenderness at McBurney's point and negative Murphy's sign.  Musculoskeletal: Normal range of motion. He exhibits no edema.  Lymphadenopathy:    He has no cervical adenopathy.  Neurological: He is alert and oriented to person, place, and time.  Skin: Skin is warm and dry.  Nursing note and vitals reviewed.   ED Course  Procedures (including critical care time) Labs Review Labs Reviewed - No data to display  Imaging Review No results found. I have personally reviewed and evaluated these images and lab results as part of my medical decision-making.  Filed Vitals:   10/06/15 0956  BP: 134/79  Pulse: 55  Temp:   Resp: 18    Meds given in  ED:  Medications  ketorolac (TORADOL) 30 MG/ML injection 30 mg (30 mg Intravenous Given 10/06/15 0815)  morphine 4 MG/ML injection 4 mg (4 mg Intravenous Given 10/06/15 0935)    Discharge Medication List as of 10/06/2015  9:52 AM    START taking these medications   Details  ibuprofen (ADVIL,MOTRIN) 800 MG tablet Take 1 tablet (800 mg total) by mouth 3 (three) times daily., Starting 10/06/2015, Until Discontinued, Print    ondansetron (ZOFRAN ODT) 4 MG  disintegrating tablet Take 1 tablet (4 mg total) by mouth every 8 (eight) hours as needed for nausea or vomiting., Starting 10/06/2015, Until Discontinued, Print         MDM   Final diagnoses:  Acute right flank pain    Pt presents with sudden onset constant sharp right flank pain with N/V. He states pain is similar in presentation to kidney stone he has had in the past. VSS. Exam unremarkable. Pt given zofran and pain meds. Dr. Jodi MourningZavitz performed bedside US which revealed no hydronephrosis. CBC, BMP and UA unremarkable. CT renal stone ordered for further imaging. Pt reports pain has improved. CT reveals right inguinal hernia, mild sigmoid diverticulosis, no urolithiasis/obstruction. Discussed results with pt and plan for d/c. Pt given GI follow up and rx for zofran. Advised pt to   Evaluation does not show pathology requring ongoing emergent intervention or admission. Pt is hemodynamically stable and mentating appropriately. Discussed findings/results and plan with patient/guardian, who agrees with plan. All questions answered. Return precautions discussed and outpatient follow up given.    Satira Sarkicole Elizabeth GuttenbergNadeau, [place] JerseyPA-C 10/06/15 1601  Blane OharaJoshua Zavitz, MD 10/08/15 [phone]

## 2015-10-06 NOTE — ED Notes (Signed)
Pt. reports RLQ pain with nausea and vomitting onset last night , denies fever or chills , seen here this morning prescribed with Ibuprofen and Zofran with no relief.

## 2015-10-06 NOTE — Discharge Instructions (Signed)
Take your prescriptions for pain and nausea as prescribed as needed. Continue drinking fluid to remain hydrated. Follow up with your primary care provider in 2 days. Please return to the Emergency Department if symptoms worsen or new onset of fever, chills, abdominal pain, vomiting, blood in urine, difficulty urinating, burning with urination.

## 2015-10-07 ENCOUNTER — Emergency Department (HOSPITAL_COMMUNITY)
Admission: EM | Admit: 2015-10-07 | Discharge: 2015-10-07 | Disposition: A | Discharge status: Home / Self Care | Payer: Self-pay | Attending: Emergency Medicine | Admitting: Emergency Medicine

## 2015-10-07 DIAGNOSIS — R1031 Right lower quadrant pain: Secondary | ICD-10-CM

## 2015-10-07 LAB — CBC
HCT: 47.6 % (ref 39.0–52.0)
Hemoglobin: 17.5 g/dL — ABNORMAL HIGH (ref 13.0–17.0)
MCH: 32 pg (ref 26.0–34.0)
MCHC: 36.8 g/dL — AB (ref 30.0–36.0)
MCV: 87 fL (ref 78.0–100.0)
PLATELETS: 205 10*3/uL (ref 150–400)
RBC: 5.47 MIL/uL (ref 4.22–5.81)
RDW: 12.7 % (ref 11.5–15.5)
WBC: 7.7 10*3/uL (ref 4.0–10.5)

## 2015-10-07 LAB — COMPREHENSIVE METABOLIC PANEL
ALT: 22 U/L (ref 17–63)
AST: 19 U/L (ref 15–41)
Albumin: 4.2 g/dL (ref 3.5–5.0)
Alkaline Phosphatase: 69 U/L (ref 38–126)
Anion gap: 13 (ref 5–15)
BILIRUBIN TOTAL: 0.9 mg/dL (ref 0.3–1.2)
BUN: 10 mg/dL (ref 6–20)
CHLORIDE: 102 mmol/L (ref 101–111)
CO2: 20 mmol/L — ABNORMAL LOW (ref 22–32)
CREATININE: 0.91 mg/dL (ref 0.61–1.24)
Calcium: 9.4 mg/dL (ref 8.9–10.3)
GFR calc Af Amer: >60 mL/min (ref >60)
Glucose, Bld: 143 mg/dL — ABNORMAL HIGH (ref 65–99)
Potassium: 3.9 mmol/L (ref 3.5–5.1)
Sodium: 135 mmol/L (ref 135–145)
Total Protein: 7.2 g/dL (ref 6.5–8.1)

## 2015-10-07 LAB — LIPASE, BLOOD: LIPASE: 24 U/L (ref 22–51)

## 2015-10-07 MED ORDER — OXYCODONE-ACETAMINOPHEN 5-325 MG PO TABS: 1.0000 | ORAL_TABLET | ORAL | Status: DC | PRN

## 2015-10-07 MED ORDER — FENTANYL CITRATE (PF) 100 MCG/2ML IJ SOLN
INTRAMUSCULAR | Status: AC
  Filled 2015-10-07: qty 2

## 2015-10-07 MED ORDER — SODIUM CHLORIDE 0.9 % IV BOLUS (SEPSIS)
1000.0000 mL | Freq: Once | INTRAVENOUS | Status: AC
Start: 2015-10-07 — End: 2015-10-07
  Administered 2015-10-07: 1000 mL via INTRAVENOUS

## 2015-10-07 MED ORDER — ONDANSETRON HCL 4 MG/2ML IJ SOLN
4.0000 mg | Freq: Once | INTRAMUSCULAR | Status: AC
  Administered 2015-10-07: 4 mg via INTRAVENOUS
  Filled 2015-10-07: qty 2

## 2015-10-07 MED ORDER — FENTANYL CITRATE (PF) 100 MCG/2ML IJ SOLN
50.0000 ug | Freq: Once | INTRAMUSCULAR | Status: AC
  Administered 2015-10-07: 50 ug via INTRAVENOUS

## 2015-10-07 MED ORDER — MORPHINE SULFATE (PF) 4 MG/ML IV SOLN
8.0000 mg | Freq: Once | INTRAVENOUS | Status: AC
  Administered 2015-10-07: 4 mg via INTRAVENOUS
  Filled 2015-10-07: qty 2

## 2015-10-07 NOTE — ED Provider Notes (Signed)
CSN: [id number]     Arrival date & time 10/06/15  2303 History  By signing my name below, I, Placido Sou, attest that this documentation has been prepared under the direction and in the presence of Azalia Bilis, MD. Electronically Signed: Placido Sou, ED Scribe. 10/07/2015. 2:47 AM.   Chief Complaint  Patient presents with  . Abdominal Pain   The history is provided by the patient and the spouse. No language interpreter was used.   HPI Comments: Adrian Foley is a 64 y.o. male who presents to the Emergency Department complaining of constant, moderate, right sided abd pain with onset last night. Pt notes that the pain began in his right flank and is now in his right abdominal region. He notes that he came to the ED this morning and received an [place] and a CT and was discharged with an rx for 800 mg ibuprofen for pain management. Pt notes 1x self-induced vomiting last night and further notes associated nausea and decreased appetite with his nausea being somewhat alleviated currently. Pt notes a hx of kidney stones and further notes this occurrence is abnormal due to it not passing to his urinary region. He also notes a hx of right inguinal hernia but denies any current pain in this region.   Past Medical History  Diagnosis Date  . Kidney stones   . CAD (coronary artery disease)   . Hyperlipidemia    Past Surgical History  Procedure Laterality Date  . Angioplasty     Family History  Problem Relation Age of Onset  . Heart disease Maternal Aunt    Social History  Substance Use Topics  . Smoking status: Former Smoker    Quit date: 12/21/2007  . Smokeless tobacco: None  . Alcohol Use: No    Review of Systems A complete 10 system review of systems was obtained and all systems are negative except as noted in the HPI and PMH.   Allergies  Review of patient's allergies indicates no known allergies.  Home Medications   Prior to Admission medications   Medication Sig Start Date End  Date Taking? Authorizing Provider  aspirin 325 MG tablet Take 325 mg by mouth daily.    Historical Provider, MD  ibuprofen (ADVIL,MOTRIN) 800 MG tablet Take 1 tablet (800 mg total) by mouth 3 (three) times daily. 10/06/15   Barrett Henle, PA-C  ondansetron (ZOFRAN ODT) 4 MG disintegrating tablet Take 1 tablet (4 mg total) by mouth every 8 (eight) hours as needed for nausea or vomiting. 10/06/15   Satira Sark Nadeau, PA-C   BP 138/94 mmHg  Pulse 54  Temp(Src) 98.4 F (36.9 C) (Oral)  Resp 24  SpO2 97% Physical Exam  Constitutional: He is oriented to person, place, and time. He appears well-developed and well-nourished.  HENT:  Head: Normocephalic and atraumatic.  Eyes: EOM are normal.  Neck: Normal range of motion.  Cardiovascular: Normal rate, regular rhythm, normal heart sounds and intact distal pulses.   Pulmonary/Chest: Effort normal and breath sounds normal. No respiratory distress.  Abdominal: Soft. He exhibits no distension. There is no tenderness.  Musculoskeletal: Normal range of motion.  Neurological: He is alert and oriented to person, place, and time.  Skin: Skin is warm and dry.  Psychiatric: He has a normal mood and affect. Judgment normal.  Nursing note and vitals reviewed.  ED Course  Procedures  DIAGNOSTIC STUDIES: Oxygen Saturation is 97% on RA, normal by my interpretation.    COORDINATION OF CARE: 2:47 AM  Discussed treatment plan with pt at bedside and pt agreed to plan.  Labs Review Labs Reviewed  COMPREHENSIVE METABOLIC PANEL - Abnormal; Notable for the following:    CO2 20 (*)    Glucose, Bld 143 (*)    All other components within normal limits  CBC - Abnormal; Notable for the following:    Hemoglobin 17.5 (*)    MCHC 36.8 (*)    All other components within normal limits  URINALYSIS, ROUTINE W REFLEX MICROSCOPIC (NOT AT [facility]) - Abnormal; Notable for the following:    Ketones, ur 15 (*)    All other components within normal limits   LIPASE, BLOOD   Imaging Review Ct Renal Stone Study  10/06/2015  CLINICAL DATA:  Acute right flank/inguinal pain. History of nephrolithiasis. EXAM: CT ABDOMEN AND PELVIS WITHOUT CONTRAST TECHNIQUE: Multidetector CT imaging of the abdomen and pelvis was performed following the standard protocol without IV contrast. COMPARISON:  None. FINDINGS: Lower chest: No significant pulmonary nodules or acute consolidative airspace disease. Right coronary artery calcifications are present. Hepatobiliary: Mild diffuse hepatic steatosis. No liver mass. Normal gallbladder with no radiopaque cholelithiasis. No biliary ductal dilatation. Pancreas: Normal, with no mass or duct dilation. Spleen: Normal size. No mass. Adrenals/Urinary Tract: Normal adrenals. No hydronephrosis. No nephrolithiasis. Normal caliber ureters, with no ureteral stones. There is a hypodense partially exophytic 0.8 cm lesion in the lateral upper right kidney, too small to characterize. There are a few benign parapelvic renal cysts in the left renal sinus. No additional contour deforming renal lesions. Collapsed and grossly normal bladder. Stomach/Bowel: Grossly normal stomach. Normal caliber small bowel with no small bowel wall thickening. Normal appendix. There is mild sigmoid diverticulosis, with no large bowel wall thickening or pericolonic fat stranding. Vascular/Lymphatic: Atherosclerotic nonaneurysmal abdominal aorta. No pathologically enlarged lymph nodes in the abdomen or pelvis. Reproductive: Mild prostatomegaly. Other: No pneumoperitoneum, ascites or focal fluid collection. Musculoskeletal: No aggressive appearing focal osseous lesions. Small fat containing right inguinal hernia. Moderate degenerative changes in the visualized thoracolumbar spine. IMPRESSION: 1. Small fat containing right inguinal hernia. 2. Mild sigmoid diverticulosis, with no evidence of acute diverticulitis. 3. No urolithiasis.  No evidence of urinary tract obstruction. 4.  Mild diffuse hepatic steatosis. 5. Right coronary artery calcifications. 6. Subcentimeter hypodense lesion in the lateral upper right kidney, too small to characterize. 7. Mild prostatomegaly. Electronically Signed   By: Delbert PhenixJason A Poff M.D.   On: 10/06/2015 09:19   I have personally reviewed and evaluated these images and lab results as part of my medical decision-making.   EKG Interpretation None      MDM   Final diagnoses:  None    Symptoms Sound consistent with ureteral colic.  I personally reviewed his CT scan and discussed the CT scan from earlier with the radiologist who reviewed the CT.  The signs of inflammation.  No signs of hydronephrosis.  No ureteral stones noted.  No other right-sided pathology noted on CT imaging.  Repeat labs and urine without abnormality.  He has a small right inguinal hernia that is easily reproducible on examination.  Unclear etiology of his pain.  Primary care and urology follow-up.  Patient understands return the ER for new or worsening symptoms.  Home with a short course of Percocet.  I, Masie Bermingham M, personally performed the services described in this documentation. All medical record entries made by the scribe were at my direction and in my presence.  I have reviewed the chart and discharge instructions and agree that the  record reflects my personal performance and is accurate and complete. Berdene Askari M.  10/07/2015. 4:27 AM.       Azalia Bilis, MD 10/07/15 [phone]

## 2015-10-07 NOTE — ED Notes (Signed)
Patient didn't want to remove clothing and put on gown, "all that isn't necessary, i just want something for pain".

## 2015-10-07 NOTE — ED Notes (Signed)
Patient ambulated to restroom unassisted. Provided strainer for urine.

## 2015-12-20 ENCOUNTER — Emergency Department (HOSPITAL_COMMUNITY): Payer: Self-pay

## 2015-12-20 ENCOUNTER — Encounter (HOSPITAL_COMMUNITY): Payer: Self-pay

## 2015-12-20 ENCOUNTER — Emergency Department (HOSPITAL_COMMUNITY)
Admission: EM | Admit: 2015-12-20 | Discharge: 2015-12-20 | Disposition: A | Discharge status: Home / Self Care | Payer: Self-pay | Attending: Emergency Medicine | Admitting: Emergency Medicine

## 2015-12-20 DIAGNOSIS — I200 Unstable angina: Secondary | ICD-10-CM

## 2015-12-20 DIAGNOSIS — Z791 Long term (current) use of non-steroidal anti-inflammatories (NSAID): Secondary | ICD-10-CM | POA: Insufficient documentation

## 2015-12-20 DIAGNOSIS — I25110 Atherosclerotic heart disease of native coronary artery with unstable angina pectoris: Secondary | ICD-10-CM | POA: Insufficient documentation

## 2015-12-20 DIAGNOSIS — Z9861 Coronary angioplasty status: Secondary | ICD-10-CM | POA: Insufficient documentation

## 2015-12-20 DIAGNOSIS — Z87442 Personal history of urinary calculi: Secondary | ICD-10-CM | POA: Insufficient documentation

## 2015-12-20 DIAGNOSIS — Z8639 Personal history of other endocrine, nutritional and metabolic disease: Secondary | ICD-10-CM | POA: Insufficient documentation

## 2015-12-20 DIAGNOSIS — Z7982 Long term (current) use of aspirin: Secondary | ICD-10-CM | POA: Insufficient documentation

## 2015-12-20 DIAGNOSIS — Z87891 Personal history of nicotine dependence: Secondary | ICD-10-CM | POA: Insufficient documentation

## 2015-12-20 LAB — BASIC METABOLIC PANEL
ANION GAP: 10 (ref 5–15)
BUN: 12 mg/dL (ref 6–20)
CALCIUM: 9.3 mg/dL (ref 8.9–10.3)
CO2: 25 mmol/L (ref 22–32)
CREATININE: 0.99 mg/dL (ref 0.61–1.24)
Chloride: 104 mmol/L (ref 101–111)
GLUCOSE: 167 mg/dL — AB (ref 65–99)
Potassium: 4.1 mmol/L (ref 3.5–5.1)
Sodium: 139 mmol/L (ref 135–145)

## 2015-12-20 LAB — CBC
HCT: 50.3 % (ref 39.0–52.0)
Hemoglobin: 17.3 g/dL — ABNORMAL HIGH (ref 13.0–17.0)
MCH: 30.6 pg (ref 26.0–34.0)
MCHC: 34.4 g/dL (ref 30.0–36.0)
MCV: 89 fL (ref 78.0–100.0)
PLATELETS: 186 10*3/uL (ref 150–400)
RBC: 5.65 MIL/uL (ref 4.22–5.81)
RDW: 12.9 % (ref 11.5–15.5)
WBC: 5.3 10*3/uL (ref 4.0–10.5)

## 2015-12-20 LAB — I-STAT TROPONIN, ED: Troponin i, poc: 0 ng/mL (ref 0.00–0.08)

## 2015-12-20 MED ORDER — NITROGLYCERIN 0.4 MG SL SUBL: 0.4000 mg | SUBLINGUAL_TABLET | SUBLINGUAL | Status: DC | PRN

## 2015-12-20 NOTE — ED Notes (Signed)
Pt c/o new onset, intermittent central chest pain starting Tuesday while exercising on treadmill. Denies any other associated symptoms. Pt c/o pain starting again this morning, rates 4/10, took 3 baby aspirin and has no CP at this time. Hx stent placed 17 years ago.

## 2015-12-20 NOTE — Discharge Instructions (Signed)
Please follow-up with Dr. Johney Frame in cardiology clinic early next week for further evaluation of your chest pain. Your workup in the ER today was negative. Per your request, I will give you a prescription for sublingual nitroglycerin to take as needed. However, please return to the ER immediately for new or worsening symptoms.     Angina Pectoris Angina pectoris, often called angina, is extreme discomfort in the chest, neck, or arm. This is caused by a lack of blood in the middle and thickest layer of the heart wall (myocardium). There are four types of angina:  Stable angina. Stable angina usually occurs in episodes of predictable frequency and duration. It is usually brought on by physical activity, stress, or excitement. Stable angina usually lasts a few minutes and can often be relieved by a medicine that you place under your tongue. This medicine is called sublingual nitroglycerin.  Unstable angina. Unstable angina can occur even when you are doing little or no physical activity. It can even occur while you are sleeping or when you are at rest. It can suddenly increase in severity or frequency. It may not be relieved by sublingual nitroglycerin, and it can last up to 30 minutes.  Microvascular angina. This type of angina is caused by a disorder of tiny blood vessels called arterioles. Microvascular angina is more common in women. The pain may be more severe and last longer than other types of angina pectoris.  Prinzmetal or variant angina. This type of angina pectoris is rare and usually occurs when you are doing little or no physical activity. It especially occurs in the early morning hours. CAUSES Atherosclerosis is the cause of angina. This is the buildup of fat and cholesterol (plaque) on the inside of the arteries. Over time, the plaque may narrow or block the artery, and this will lessen blood flow to the heart. Plaque can also become weak and break off within a coronary artery to form a  clot and cause a sudden blockage. RISK FACTORS Risk factors common to both men and women include:  High cholesterol levels.  High blood pressure (hypertension).  Tobacco use.  Diabetes.  Family history of angina.  Obesity.  Lack of exercise.  A diet high in saturated fats. Women are at greater risk for angina if they are:  Over age 65.  Postmenopausal. SYMPTOMS Many people do not experience any symptoms during the early stages of angina. As the condition progresses, symptoms common to both men and women may include:  Chest pain.  The pain can be described as a crushing or squeezing in the chest, or a tightness, pressure, fullness, or heaviness in the chest.  The pain can last more than a few minutes, or it can stop and recur.  Pain in the arms, neck, jaw, or back.  Unexplained heartburn or indigestion.  Shortness of breath.  Nausea.  Sudden cold sweats.  Sudden light-headedness. Many women have chest discomfort and some of the other symptoms. However, women often have different (atypical) symptoms, such as:   Fatigue.  Unexplained feelings of nervousness or anxiety.  Unexplained weakness.  Dizziness or fainting. Sometimes, women may have angina without any symptoms. DIAGNOSIS  Tests to diagnose angina may include:  ECG (electrocardiogram).  Exercise stress test. This looks for signs of blockage when the heart is being exercised.  Pharmacologic stress test. This test looks for signs of blockage when the heart is being stressed with a medicine.  Blood tests.  Coronary angiogram. This is a procedure to  look at the coronary arteries to see if there is any blockage. TREATMENT  The treatment of angina may include the following:  Healthy behavioral changes to reduce or control risk factors.  Medicine.  Coronary stenting.A stent helps to keep an artery open.  Coronary angioplasty. This procedure widens a narrowed or blocked artery.  Coronary  arterybypass surgery. This will allow your blood to pass the blockage (bypass) to reach your heart. HOME CARE INSTRUCTIONS   Take medicines only as directed by your health care provider.  Do not take the following medicines unless your health care provider approves:  Nonsteroidal anti-inflammatory drugs (NSAIDs), such as ibuprofen, naproxen, or celecoxib.  Vitamin supplements that contain vitamin A, vitamin E, or both.  Hormone replacement therapy that contains estrogen with or without progestin.  Manage other health conditions such as hypertension and diabetes as directed by your health care provider.  Follow a heart-healthy diet. A dietitian can help to educate you about healthy food options and changes.  Use healthy cooking methods such as roasting, grilling, broiling, baking, poaching, steaming, or stir-frying. Talk to a dietitian to learn more about healthy cooking methods.  Follow an exercise program approved by your health care provider.  Maintain a healthy weight. Lose weight as approved by your health care provider.  Plan rest periods when fatigued.  Learn to manage stress.  Do not use any tobacco products, including cigarettes, chewing tobacco, or electronic cigarettes. If you need help quitting, ask your health care provider.  If you drink alcohol, and your health care provider approves, limit your alcohol intake to no more than 1 drink per day. One drink equals 12 ounces of beer, 5 ounces of wine, or 1 ounces of hard liquor.  Stop illegal drug use.  Keep all follow-up visits as directed by your health care provider. This is important. SEEK IMMEDIATE MEDICAL CARE IF:   You have pain in your chest, neck, arm, jaw, stomach, or back that lasts more than a few minutes, is recurring, or is unrelieved by taking sublingualnitroglycerin.  You have profuse sweating without cause.  You have unexplained:  Heartburn or indigestion.  Shortness of breath or difficulty  breathing.  Nausea or vomiting.  Fatigue.  Feelings of nervousness or anxiety.  Weakness.  Diarrhea.  You have sudden light-headedness or dizziness.  You faint. These symptoms may represent a serious problem that is an emergency. Do not wait to see if the symptoms will go away. Get medical help right away. Call your local emergency services (911 in the [place] Do not drive yourself to the hospital.   This information is not intended to replace advice given to you by your health care provider. Make sure you discuss any questions you have with your health care provider.   Document Released: 12/06/2005 Document Revised: 12/27/2014 Document Reviewed: 04/09/2014 Elsevier Interactive Patient Education [employer]2016 [employer]

## 2015-12-20 NOTE — ED Provider Notes (Signed)
CSN: [id number]     Arrival date & time 12/20/15  0910 History   First MD Initiated Contact with Patient 12/20/15 [phone]     Chief Complaint  Patient presents with  . Chest Pain     HPI  [name] is an 64 y.o. male with history of CAD (s/p stent placement at 17 years ago), HLD who presents to the ED for evaluation of exertional chest pain. He states he was in his usual state of health until four days ago when he began noticing chest tightness while he was at work at a [employer] site which resolved on its own over the course of the morning. He states that later that day he was doing his daily walk on the treadmill when the tightness returned. That again resolved on its own. He states this AM he woke up and was about 10 minutes into his walk on a treadmill when he began experiencing a heavy, sharp substernal chest pain. He states he was starting to sweat from exercise but he did not feel diaphoretic/clammy all over. Denies SOB, N/V, weakness or numbness. States the pain resolved within a few minutes after he took 3 baby aspirin. Now reports zero pain. He states he feels fine. He states he stopped following with cardiology years ago. He does not take any medications regularly.   Past Medical History  Diagnosis Date  . Kidney stones   . CAD (coronary artery disease)   . Hyperlipidemia    Past Surgical History  Procedure Laterality Date  . Angioplasty     Family History  Problem Relation Age of Onset  . Heart disease Maternal Aunt    Social History  Substance Use Topics  . Smoking status: Former Smoker    Quit date: 12/21/2007  . Smokeless tobacco: None  . Alcohol Use: No    Review of Systems  All other systems reviewed and are negative.     Allergies  Review of patient's allergies indicates no known allergies.  Home Medications   Prior to Admission medications   Medication Sig Start Date End Date Taking? Authorizing Provider  aspirin 325 MG tablet Take 325 mg by mouth  daily.   Yes Historical Provider, MD  ibuprofen (ADVIL,MOTRIN) 800 MG tablet Take 1 tablet (800 mg total) by mouth 3 (three) times daily. 10/06/15   Satira SarkNicole Elizabeth Nadeau, PA-C   BP 131/90 mmHg  Pulse 67  Temp(Src) 97.4 F (36.3 C) (Oral)  Resp 16  Ht 5\' 9"  (1.753 m)  Wt 88.451 kg  BMI 28.78 kg/m2  SpO2 97% Physical Exam  Constitutional: He is oriented to person, place, and time. No distress.  HENT:  Right Ear: External ear normal.  Left Ear: External ear normal.  Nose: Nose normal.  Mouth/Throat: Oropharynx is clear and moist. No oropharyngeal exudate.  Eyes: Conjunctivae and EOM are normal. Pupils are equal, round, and reactive to light.  Neck: Normal range of motion. Neck supple.  Cardiovascular: Normal rate, regular rhythm, normal heart sounds and intact distal pulses.   Pulmonary/Chest: Effort normal and breath sounds normal. No respiratory distress. He exhibits no tenderness.  Abdominal: Soft. Bowel sounds are normal. He exhibits no distension. There is no tenderness.  Musculoskeletal: Normal range of motion. He exhibits no edema.  Neurological: He is alert and oriented to person, place, and time. No cranial nerve deficit.  Skin: Skin is warm and dry. He is not diaphoretic.  Psychiatric: He has a normal mood and affect.  Nursing note and vitals  reviewed.   ED Course  Procedures (including critical care time) Labs Review Labs Reviewed  BASIC METABOLIC PANEL - Abnormal; Notable for the following:    Glucose, Bld 167 (*)    All other components within normal limits  CBC - Abnormal; Notable for the following:    Hemoglobin 17.3 (*)    All other components within normal limits  I-STAT TROPOININ, ED    Imaging Review Dg Chest 2 View  12/20/2015  CLINICAL DATA:  Acute chest pain. EXAM: CHEST  2 VIEW COMPARISON:  None. FINDINGS: The heart size and mediastinal contours are within normal limits. Both lungs are clear. No pneumothorax or pleural effusion is noted. Severe  degenerative changes seen involving right acromioclavicular joint. IMPRESSION: No active cardiopulmonary disease. Electronically Signed   By: Lupita Raider, M.D.   On: 12/20/2015 10:05   I have personally reviewed and evaluated these images and lab results as part of my medical decision-making.   EKG Interpretation   Date/Time:  Saturday December 20 2015 09:16:21 EST Ventricular Rate:  55 PR Interval:  168 QRS Duration: 111 QT Interval:  429 QTC Calculation: 410 R Axis:   16 Text Interpretation:  Sinus rhythm Borderline T abnormalities, inferior  leads No significant change since last tracing Confirmed by Denton Lank  MD,  Caryn Bee ([id number]) on 12/20/2015 9:35:21 AM      MDM   Final diagnoses:  Unstable angina (HCC)   Initial labs unremarkable. Negative troponin. CXR negative. EKG unchanged from prior. HEART score 4. I would consider this unstable angina. Given pt's history and lack of recent cardiology f/u I suspect pt would benefit from further cardiac evaluation. However, given that pt is pain free now and his symptoms only lasted a few minutes, I discussed with pt that I will consult cards to get their input whether pt requires admission.   Spoke to Dr. Johney Frame of cards. He advised that pt is currently stable for outpatient management and will plan for Myoview next week given short duration of symptoms and resolution of pain. He does not feel pt needs delta troponin at this time. Instructed pt to call [facility] to schedule outpatient f/u in clinic early next week. Pt is requesting rx for SL nitro PRN. He states that he would feel better having the nitro to hold on to. Will give rx for SL nitro but strict ER return precautions given. Pt verbalized his understanding. VSS and he is stable for discharge.   Carlene Coria, PA-C 12/20/15 1124  Cathren Laine, MD 12/20/15 [phone]

## 2015-12-21 ENCOUNTER — Encounter (HOSPITAL_COMMUNITY): Payer: Self-pay | Admitting: *Deleted

## 2015-12-21 ENCOUNTER — Inpatient Hospital Stay (HOSPITAL_COMMUNITY): Payer: Self-pay

## 2015-12-21 ENCOUNTER — Inpatient Hospital Stay (HOSPITAL_COMMUNITY)
Admission: EM | Admit: 2015-12-21 | Discharge: 2016-01-03 | DRG: 234 | Disposition: A | Discharge status: Home / Self Care | Payer: Self-pay | Attending: Cardiothoracic Surgery | Admitting: Cardiothoracic Surgery

## 2015-12-21 ENCOUNTER — Emergency Department (HOSPITAL_COMMUNITY): Payer: Self-pay

## 2015-12-21 DIAGNOSIS — E669 Obesity, unspecified: Secondary | ICD-10-CM | POA: Diagnosis present

## 2015-12-21 DIAGNOSIS — Z7982 Long term (current) use of aspirin: Secondary | ICD-10-CM

## 2015-12-21 DIAGNOSIS — E8770 Fluid overload, unspecified: Secondary | ICD-10-CM | POA: Diagnosis not present

## 2015-12-21 DIAGNOSIS — Z951 Presence of aortocoronary bypass graft: Secondary | ICD-10-CM

## 2015-12-21 DIAGNOSIS — Z791 Long term (current) use of non-steroidal anti-inflammatories (NSAID): Secondary | ICD-10-CM

## 2015-12-21 DIAGNOSIS — E1169 Type 2 diabetes mellitus with other specified complication: Secondary | ICD-10-CM | POA: Diagnosis present

## 2015-12-21 DIAGNOSIS — D62 Acute posthemorrhagic anemia: Secondary | ICD-10-CM | POA: Diagnosis not present

## 2015-12-21 DIAGNOSIS — I252 Old myocardial infarction: Secondary | ICD-10-CM

## 2015-12-21 DIAGNOSIS — I2510 Atherosclerotic heart disease of native coronary artery without angina pectoris: Secondary | ICD-10-CM

## 2015-12-21 DIAGNOSIS — K5900 Constipation, unspecified: Secondary | ICD-10-CM | POA: Diagnosis not present

## 2015-12-21 DIAGNOSIS — Z8249 Family history of ischemic heart disease and other diseases of the circulatory system: Secondary | ICD-10-CM

## 2015-12-21 DIAGNOSIS — I214 Non-ST elevation (NSTEMI) myocardial infarction: Principal | ICD-10-CM | POA: Diagnosis present

## 2015-12-21 DIAGNOSIS — Z87891 Personal history of nicotine dependence: Secondary | ICD-10-CM

## 2015-12-21 DIAGNOSIS — Z01811 Encounter for preprocedural respiratory examination: Secondary | ICD-10-CM

## 2015-12-21 DIAGNOSIS — Z9861 Coronary angioplasty status: Secondary | ICD-10-CM

## 2015-12-21 DIAGNOSIS — R079 Chest pain, unspecified: Secondary | ICD-10-CM

## 2015-12-21 DIAGNOSIS — I200 Unstable angina: Secondary | ICD-10-CM

## 2015-12-21 DIAGNOSIS — T82855A Stenosis of coronary artery stent, initial encounter: Secondary | ICD-10-CM | POA: Diagnosis present

## 2015-12-21 DIAGNOSIS — R0602 Shortness of breath: Secondary | ICD-10-CM

## 2015-12-21 DIAGNOSIS — E785 Hyperlipidemia, unspecified: Secondary | ICD-10-CM | POA: Diagnosis present

## 2015-12-21 DIAGNOSIS — Y831 Surgical operation with implant of artificial internal device as the cause of abnormal reaction of the patient, or of later complication, without mention of misadventure at the time of the procedure: Secondary | ICD-10-CM | POA: Diagnosis present

## 2015-12-21 DIAGNOSIS — E1159 Type 2 diabetes mellitus with other circulatory complications: Secondary | ICD-10-CM | POA: Diagnosis present

## 2015-12-21 DIAGNOSIS — Z6829 Body mass index (BMI) 29.0-29.9, adult: Secondary | ICD-10-CM

## 2015-12-21 DIAGNOSIS — I25110 Atherosclerotic heart disease of native coronary artery with unstable angina pectoris: Secondary | ICD-10-CM | POA: Diagnosis present

## 2015-12-21 HISTORY — DX: Non-ST elevation (NSTEMI) myocardial infarction: I21.4

## 2015-12-21 HISTORY — DX: Type 2 diabetes mellitus without complications: E11.9

## 2015-12-21 LAB — COMPREHENSIVE METABOLIC PANEL
ALBUMIN: 3.7 g/dL (ref 3.5–5.0)
ALK PHOS: 63 U/L (ref 38–126)
ALT: 29 U/L (ref 17–63)
ANION GAP: 11 (ref 5–15)
AST: 22 U/L (ref 15–41)
BILIRUBIN TOTAL: 0.9 mg/dL (ref 0.3–1.2)
BUN: 15 mg/dL (ref 6–20)
CALCIUM: 9.1 mg/dL (ref 8.9–10.3)
CO2: 24 mmol/L (ref 22–32)
Chloride: 105 mmol/L (ref 101–111)
Creatinine, Ser: 0.93 mg/dL (ref 0.61–1.24)
GFR calc Af Amer: >60 mL/min (ref >60)
GFR calc non Af Amer: >60 mL/min (ref >60)
GLUCOSE: 141 mg/dL — AB (ref 65–99)
Potassium: 4 mmol/L (ref 3.5–5.1)
Sodium: 140 mmol/L (ref 135–145)
TOTAL PROTEIN: 6.5 g/dL (ref 6.5–8.1)

## 2015-12-21 LAB — BASIC METABOLIC PANEL
Anion gap: 11 (ref 5–15)
BUN: 18 mg/dL (ref 6–20)
CALCIUM: 9.1 mg/dL (ref 8.9–10.3)
CO2: 20 mmol/L — AB (ref 22–32)
CREATININE: 1 mg/dL (ref 0.61–1.24)
Chloride: 107 mmol/L (ref 101–111)
GFR calc Af Amer: >60 mL/min (ref >60)
GFR calc non Af Amer: >60 mL/min (ref >60)
GLUCOSE: 162 mg/dL — AB (ref 65–99)
Potassium: 3.9 mmol/L (ref 3.5–5.1)
Sodium: 138 mmol/L (ref 135–145)

## 2015-12-21 LAB — TROPONIN I
TROPONIN I: 0.04 ng/mL — AB (ref <0.031)
TROPONIN I: 0.04 ng/mL — AB (ref <0.031)
Troponin I: 0.22 ng/mL — ABNORMAL HIGH (ref <0.031)
Troponin I: 0.36 ng/mL — ABNORMAL HIGH (ref <0.031)

## 2015-12-21 LAB — CBC
HCT: 49.3 % (ref 39.0–52.0)
Hemoglobin: 16.5 g/dL (ref 13.0–17.0)
MCH: 29.8 pg (ref 26.0–34.0)
MCHC: 33.5 g/dL (ref 30.0–36.0)
MCV: 89 fL (ref 78.0–100.0)
PLATELETS: 195 10*3/uL (ref 150–400)
RBC: 5.54 MIL/uL (ref 4.22–5.81)
RDW: 13 % (ref 11.5–15.5)
WBC: 6.8 10*3/uL (ref 4.0–10.5)

## 2015-12-21 LAB — BRAIN NATRIURETIC PEPTIDE: B Natriuretic Peptide: 34.9 pg/mL (ref 0.0–100.0)

## 2015-12-21 LAB — HEPARIN LEVEL (UNFRACTIONATED)
Heparin Unfractionated: <0.1 IU/mL — ABNORMAL LOW (ref 0.30–0.70)
Heparin Unfractionated: 0.24 IU/mL — ABNORMAL LOW (ref 0.30–0.70)

## 2015-12-21 LAB — PROTIME-INR
INR: 1.09 (ref 0.00–1.49)
PROTHROMBIN TIME: 14.3 s (ref 11.6–15.2)

## 2015-12-21 LAB — MAGNESIUM: Magnesium: 2.1 mg/dL (ref 1.7–2.4)

## 2015-12-21 LAB — TSH: TSH: 3.582 u[IU]/mL (ref 0.350–4.500)

## 2015-12-21 MED ORDER — ONDANSETRON HCL 4 MG/2ML IJ SOLN: 4.0000 mg | Freq: Four times a day (QID) | INTRAMUSCULAR | Status: DC | PRN

## 2015-12-21 MED ORDER — SODIUM CHLORIDE 0.9 % IJ SOLN: 3.0000 mL | INTRAMUSCULAR | Status: DC | PRN

## 2015-12-21 MED ORDER — HEPARIN BOLUS VIA INFUSION
2000.0000 [IU] | Freq: Once | INTRAVENOUS | Status: AC
  Administered 2015-12-21: 2000 [IU] via INTRAVENOUS
  Filled 2015-12-21: qty 2000

## 2015-12-21 MED ORDER — SODIUM CHLORIDE 0.9 % IJ SOLN
3.0000 mL | Freq: Two times a day (BID) | INTRAMUSCULAR | Status: DC
Start: 2015-12-21 — End: 2015-12-29
  Administered 2015-12-24 – 2015-12-26 (×3): 3 mL via INTRAVENOUS

## 2015-12-21 MED ORDER — METOPROLOL TARTRATE 12.5 MG HALF TABLET
12.5000 mg | ORAL_TABLET | Freq: Two times a day (BID) | ORAL | Status: DC
  Administered 2015-12-21 – 2015-12-29 (×16): 12.5 mg via ORAL
  Filled 2015-12-21 (×17): qty 1

## 2015-12-21 MED ORDER — ASPIRIN 81 MG PO CHEW: 324.0000 mg | CHEWABLE_TABLET | ORAL | Status: DC

## 2015-12-21 MED ORDER — ASPIRIN EC 81 MG PO TBEC
81.0000 mg | DELAYED_RELEASE_TABLET | Freq: Every day | ORAL | Status: DC
Start: 2015-12-21 — End: 2015-12-29
  Administered 2015-12-21 – 2015-12-28 (×8): 81 mg via ORAL
  Filled 2015-12-21 (×8): qty 1

## 2015-12-21 MED ORDER — HEPARIN BOLUS VIA INFUSION
4000.0000 [IU] | Freq: Once | INTRAVENOUS | Status: AC
  Administered 2015-12-21: 4000 [IU] via INTRAVENOUS
  Filled 2015-12-21: qty 4000

## 2015-12-21 MED ORDER — SODIUM CHLORIDE 0.9 % IV SOLN
250.0000 mL | INTRAVENOUS | Status: DC | PRN
  Administered 2015-12-23: 250 mL via INTRAVENOUS

## 2015-12-21 MED ORDER — ASPIRIN 300 MG RE SUPP: 300.0000 mg | RECTAL | Status: DC

## 2015-12-21 MED ORDER — HEPARIN (PORCINE) IN NACL 100-0.45 UNIT/ML-% IJ SOLN
1700.0000 [IU]/h | INTRAMUSCULAR | Status: DC
  Administered 2015-12-21: 1200 [IU]/h via INTRAVENOUS
  Administered 2015-12-23: 1700 [IU]/h via INTRAVENOUS
  Filled 2015-12-21 (×4): qty 250

## 2015-12-21 MED ORDER — NITROGLYCERIN IN D5W 200-5 MCG/ML-% IV SOLN
0.0000 ug/min | Freq: Once | INTRAVENOUS | Status: AC
  Administered 2015-12-21: 5 ug/min via INTRAVENOUS
  Filled 2015-12-21: qty 250

## 2015-12-21 MED ORDER — ACETAMINOPHEN 325 MG PO TABS: 650.0000 mg | ORAL_TABLET | ORAL | Status: DC | PRN

## 2015-12-21 MED ORDER — ATORVASTATIN CALCIUM 80 MG PO TABS
80.0000 mg | ORAL_TABLET | Freq: Every day | ORAL | Status: DC
  Administered 2015-12-22 – 2015-12-31 (×9): 80 mg via ORAL
  Filled 2015-12-21 (×7): qty 1
  Filled 2015-12-21: qty 2
  Filled 2015-12-21: qty 1

## 2015-12-21 MED ORDER — NITROGLYCERIN IN D5W 200-5 MCG/ML-% IV SOLN
3.0000 ug/min | INTRAVENOUS | Status: DC
  Administered 2015-12-21: 10 ug/min via INTRAVENOUS
  Filled 2015-12-21: qty 250

## 2015-12-21 NOTE — H&P (Signed)
Adrian Foley is an 65 y.o. male.    Chief Complaint: chest pain Primary Cardiologist: Dr. Rayann Heman HPI: Adrian Foley is a 65 yo man with PMH of CAD dating 17 years ago with a stent who has been having chest discomfort that is substernal for 4-5 days associated with increased physical activity/exertion. The pain has been 1-2/10 in severity and resolves with rest. However, this evening he woke up with chest pain. He took two large aspirin and one SL NTG with some resolution of symptoms. He endorses some associated diaphoresis. Currently, he is pain free and accompanied by his wife. Previously, he had no exercise intolerance. No bleeding issues, no dark stools, no upcoming surgeries.     Past Medical History  Diagnosis Date  . Kidney stones   . CAD (coronary artery disease)   . Hyperlipidemia     Past Surgical History  Procedure Laterality Date  . Angioplasty      Family History  Problem Relation Age of Onset  . Heart disease Maternal Aunt    Social History:  reports that he quit smoking about 8 years ago. He does not have any smokeless tobacco history on file. He reports that he does not drink alcohol or use illicit drugs.  Allergies: No Known Allergies  Medications Prior to Admission  Medication Sig Dispense Refill  . aspirin 325 MG tablet Take 325 mg by mouth daily.    . nitroGLYCERIN (NITROSTAT) 0.4 MG SL tablet Place 1 tablet (0.4 mg total) under the tongue every 5 (five) minutes as needed for chest pain. 15 tablet 0  . ibuprofen (ADVIL,MOTRIN) 800 MG tablet Take 1 tablet (800 mg total) by mouth 3 (three) times daily. (Patient not taking: Reported on 12/21/2015) 21 tablet 0    Results for orders placed or performed during the hospital encounter of 12/21/15 (from the past 48 hour(s))  Basic metabolic panel     Status: Abnormal   Collection Time: 12/21/15  2:41 AM  Result Value Ref Range   Sodium 138 135 - 145 mmol/L   Potassium 3.9 3.5 - 5.1 mmol/L   Chloride 107 101 - 111 mmol/L     CO2 20 (L) 22 - 32 mmol/L   Glucose, Bld 162 (H) 65 - 99 mg/dL   BUN 18 6 - 20 mg/dL   Creatinine, Ser 1.00 0.61 - 1.24 mg/dL   Calcium 9.1 8.9 - 10.3 mg/dL   GFR calc non Af Amer >60 >60 mL/min   GFR calc Af Amer >60 >60 mL/min    Comment: (NOTE) The eGFR has been calculated using the CKD EPI equation. This calculation has not been validated in all clinical situations. eGFR's persistently <60 mL/min signify possible Chronic Kidney Disease.    Anion gap 11 5 - 15  CBC     Status: None   Collection Time: 12/21/15  2:41 AM  Result Value Ref Range   WBC 6.8 4.0 - 10.5 K/uL   RBC 5.54 4.22 - 5.81 MIL/uL   Hemoglobin 16.5 13.0 - 17.0 g/dL   HCT 49.3 39.0 - 52.0 %   MCV 89.0 78.0 - 100.0 fL   MCH 29.8 26.0 - 34.0 pg   MCHC 33.5 30.0 - 36.0 g/dL   RDW 13.0 11.5 - 15.5 %   Platelets 195 150 - 400 K/uL  Troponin I     Status: Abnormal   Collection Time: 12/21/15  2:41 AM  Result Value Ref Range   Troponin I 0.04 (H) <0.031 ng/mL  Comment:        PERSISTENTLY INCREASED TROPONIN VALUES IN THE RANGE OF 0.04-0.49 ng/mL CAN BE SEEN IN:       -UNSTABLE ANGINA       -CONGESTIVE HEART FAILURE       -MYOCARDITIS       -CHEST TRAUMA       -ARRYHTHMIAS       -LATE PRESENTING MYOCARDIAL INFARCTION       -COPD   CLINICAL FOLLOW-UP RECOMMENDED.    Dg Chest 2 View  12/20/2015  CLINICAL DATA:  Acute chest pain. EXAM: CHEST  2 VIEW COMPARISON:  None. FINDINGS: The heart size and mediastinal contours are within normal limits. Both lungs are clear. No pneumothorax or pleural effusion is noted. Severe degenerative changes seen involving right acromioclavicular joint. IMPRESSION: No active cardiopulmonary disease. Electronically Signed   By: [name], M.D.   On: 12/20/2015 10:05   Dg Chest Port 1 View  12/21/2015  CLINICAL DATA:  Acute onset of mid chest pain.  Initial encounter. EXAM: PORTABLE CHEST 1 VIEW COMPARISON:  Chest radiograph performed 12/20/2015 FINDINGS: The lungs are  well-aerated. Minimal left basilar atelectasis is noted. There is no evidence of pleural effusion or pneumothorax. The cardiomediastinal silhouette is within normal limits. No acute osseous abnormalities are seen. Degenerative change is noted at the right acromioclavicular joint. IMPRESSION: Minimal left basilar atelectasis noted.  Lungs otherwise clear. Electronically Signed   By: Garald Balding M.D.   On: 12/21/2015 05:00    Review of Systems  Constitutional: Positive for diaphoresis. Negative for chills, weight loss and malaise/fatigue.  HENT: Negative for ear discharge and ear pain.   Eyes: Negative for double vision, photophobia and pain.  Respiratory: Negative for cough, hemoptysis, sputum production and shortness of breath.   Cardiovascular: Positive for chest pain. Negative for palpitations, orthopnea and claudication.  Gastrointestinal: Negative for nausea, vomiting and abdominal pain.  Genitourinary: Negative for dysuria, urgency and frequency.  Musculoskeletal: Negative for myalgias, back pain and neck pain.  Skin: Negative for rash.  Neurological: Negative for dizziness, tingling, tremors and headaches.  Endo/Heme/Allergies: Negative for polydipsia. Does not bruise/bleed easily.  Psychiatric/Behavioral: Negative for depression, suicidal ideas and substance abuse.    Blood pressure 140/81, pulse 52, temperature 98.4 F (36.9 C), resp. rate 16, height '5\' 9"'  (1.753 m), weight 88.451 kg (195 lb), SpO2 95 %. Physical Exam  Nursing note and vitals reviewed. Constitutional: He is oriented to person, place, and time. He appears well-developed and well-nourished. No distress.  HENT:  Head: Normocephalic and atraumatic.  Nose: Nose normal.  Mouth/Throat: Oropharynx is clear and moist. No oropharyngeal exudate.  Eyes: Conjunctivae and EOM are normal. Pupils are equal, round, and reactive to light. No scleral icterus.  Neck: Normal range of motion. Neck supple. No JVD present. No tracheal  deviation present.  Cardiovascular: Normal rate, regular rhythm, normal heart sounds and intact distal pulses.  Exam reveals no gallop.   No murmur heard. Respiratory: Effort normal and breath sounds normal. No respiratory distress. He has no wheezes. He has no rales.  GI: Soft. Bowel sounds are normal. He exhibits no distension. There is no tenderness. There is no rebound.  Musculoskeletal: Normal range of motion. He exhibits no edema or tenderness.  Neurological: He is alert and oriented to person, place, and time. No cranial nerve deficit. Coordination normal.  Skin: Skin is warm and dry. No rash noted. He is not diaphoretic. No erythema.  Psychiatric: He has a normal mood  and affect. His behavior is normal. Thought content normal.    Labs reviewed; bun/cr 18/1.0, na 139, K 3.9, troponin 0.04, plt 195  EKG: early transition, sinus rhythm with nonspecific ST changes Chest x-ray: no acute process  Assessment/Plan Mr. Miyasato is a 65 yo man with PMH of CAD dating 17 years ago with a stent who has been having chest discomfort that is substernal for 4-5 days associated with increased physical activity/exertion. He has known CAD, age > 3, pain improved with NTG. Differential diagnosis is musculoskeletal pain, esophageal spasm, GERD, pericarditis, ACS/NSTEMI among other etiologies. I favor a diagnosis of unstable angina with likely small NSTEMI (troponin 0.04). He is on heparin gtt, he received asa. Will continue to follow closely, low threshold to activate cath lab for any changes in symptoms. Otherwise, favor LHC.  Problem List Chest Pain/Unstable Angina Known coronary Artery Disease Dyslipidemia Elevated troponin - admit to stepdown/telemetry - trend cardiac markers - diet heart healthy - asa 81 mg, atorvastatin 80 mg qHS first dose now - continue heparin gtt - echocardiogram this AM - hba1c, tsh, lipid panel, BNP    Merleen Picazo 12/21/2015, 6:13 AM

## 2015-12-21 NOTE — ED Provider Notes (Signed)
CSN: [id number]     Arrival date & time 12/21/15  0211 History  By signing my name below, I, Sonum Patel, attest that this documentation has been prepared under the direction and in the presence of Gilda Creasehristopher J Skylinn Vialpando, MD. Electronically Signed: Sonum Patel, Neurosurgeoncribe. 12/21/2015. 3:56 AM.    Chief Complaint  Patient presents with  . Chest Pain   The history is provided by the patient. No language interpreter was used.     HPI Comments: Adrian Foley is a 65 y.o. male with past medical history of CAD who presents to the Emergency Department complaining of 4-5 days of intermittent central CP with mild activity. He states he has had this pain daily and rates it as a 1-2/10 each time it occurs. He states the pain improves and resolves with rest. He reports having another episode of this pain tonight waking him from sleep. He has taken one NTG and two 324 ASA 3 hours ago but the pain is not completely resolved. He had associated diaphoresis at first but states this has since resolved. He was seen in the ED on 12/20/15 for the same symptoms and was discharged home with outpatient management and plan for Myoview next week. He had a cardiac stent 17 years ago and his last stress test was ~15 years ago. He denies SOB, nausea.    Past Medical History  Diagnosis Date  . Kidney stones   . CAD (coronary artery disease)   . Hyperlipidemia    Past Surgical History  Procedure Laterality Date  . Angioplasty     Family History  Problem Relation Age of Onset  . Heart disease Maternal Aunt    Social History  Substance Use Topics  . Smoking status: Former Smoker    Quit date: 12/21/2007  . Smokeless tobacco: None  . Alcohol Use: No    Review of Systems  Constitutional: Positive for diaphoresis.  Respiratory: Negative for shortness of breath.   Cardiovascular: Positive for chest pain.  Gastrointestinal: Negative for nausea.  All other systems reviewed and are negative.     Allergies  Review  of patient's allergies indicates no known allergies.  Home Medications   Prior to Admission medications   Medication Sig Start Date End Date Taking? Authorizing Provider  aspirin 325 MG tablet Take 325 mg by mouth daily.    Historical Provider, MD  ibuprofen (ADVIL,MOTRIN) 800 MG tablet Take 1 tablet (800 mg total) by mouth 3 (three) times daily. 10/06/15   Barrett HenleNicole Elizabeth Nadeau, PA-C  nitroGLYCERIN (NITROSTAT) 0.4 MG SL tablet Place 1 tablet (0.4 mg total) under the tongue every 5 (five) minutes as needed for chest pain. 12/20/15   Ace GinsSerena Y Sam, PA-C   BP 147/86 mmHg  Pulse 55  Temp(Src) 98.4 F (36.9 C)  Resp 11  Ht 5\' 9"  (1.753 m)  Wt 195 lb (88.451 kg)  BMI 28.78 kg/m2  SpO2 95% Physical Exam  Constitutional: He is oriented to person, place, and time. He appears well-developed and well-nourished. No distress.  HENT:  Head: Normocephalic and atraumatic.  Right Ear: Hearing normal.  Left Ear: Hearing normal.  Nose: Nose normal.  Mouth/Throat: Oropharynx is clear and moist and mucous membranes are normal.  Eyes: Conjunctivae and EOM are normal. Pupils are equal, round, and reactive to light.  Neck: Normal range of motion. Neck supple.  Cardiovascular: Regular rhythm, S1 normal and S2 normal.  Exam reveals no gallop and no friction rub.   No murmur heard. Pulmonary/Chest: Effort  normal and breath sounds normal. No respiratory distress. He exhibits no tenderness.  Abdominal: Soft. Normal appearance and bowel sounds are normal. There is no hepatosplenomegaly. There is no tenderness. There is no rebound, no guarding, no tenderness at McBurney's point and negative Murphy's sign. No hernia.  Musculoskeletal: Normal range of motion.  Neurological: He is alert and oriented to person, place, and time. He has normal strength. No cranial nerve deficit or sensory deficit. Coordination normal. GCS eye subscore is 4. GCS verbal subscore is 5. GCS motor subscore is 6.  Skin: Skin is warm, dry  and intact. No rash noted. No cyanosis.  Psychiatric: He has a normal mood and affect. His speech is normal and behavior is normal. Thought content normal.  Nursing note and vitals reviewed.   ED Course  Procedures (including critical care time)  DIAGNOSTIC STUDIES: Oxygen Saturation is 95% on RA, adequate by my interpretation.    COORDINATION OF CARE: 3:59 AM Discussed treatment plan with pt at bedside and pt agreed to plan.   Labs Review Labs Reviewed  BASIC METABOLIC PANEL - Abnormal; Notable for the following:    CO2 20 (*)    Glucose, Bld 162 (*)    All other components within normal limits  TROPONIN I - Abnormal; Notable for the following:    Troponin I 0.04 (*)    All other components within normal limits  CBC    Imaging Review Dg Chest 2 View  12/20/2015  CLINICAL DATA:  Acute chest pain. EXAM: CHEST  2 VIEW COMPARISON:  None. FINDINGS: The heart size and mediastinal contours are within normal limits. Both lungs are clear. No pneumothorax or pleural effusion is noted. Severe degenerative changes seen involving right acromioclavicular joint. IMPRESSION: No active cardiopulmonary disease. Electronically Signed   By: Lupita Raider, M.D.   On: 12/20/2015 10:05   I have personally reviewed and evaluated these images and lab results as part of my medical decision-making.   EKG Interpretation None      MDM   Final diagnoses:  None   unstable angina  Resents to the emergency department for evaluation of chest pain. Patient has been experiencing exertional chest pain daily while at work over this past week. He was seen in the ER for this yesterday and it was discussed with cardiology, ranging from a for outpatient follow-up stress test. Tonight, however, he did develop chest pain at rest. Patient experiencing substernal chest discomfort that improved but did not completely resolve when he took nitroglycerin. Findings are consistent with unstable angina and patient will  require hospitalization. Discussed briefly with Dr. Tresa Endo, on call for cardiology. Patient will be admitted to step down unit with nitroglycerin and heparin initiation.  CRITICAL CARE Performed by: Gilda Crease   Total critical care time: 30 minutes  Critical care time was exclusive of separately billable procedures and treating other patients.  Critical care was necessary to treat or prevent imminent or life-threatening deterioration.  Critical care was time spent personally by me on the following activities: development of treatment plan with patient and/or surrogate as well as nursing, discussions with consultants, evaluation of patient's response to treatment, examination of patient, obtaining history from patient or surrogate, ordering and performing treatments and interventions, ordering and review of laboratory studies, ordering and review of radiographic studies, pulse oximetry and re-evaluation of patient's condition.   I personally performed the services described in this documentation, which was scribed in my presence. The recorded information has been reviewed and  is accurate.   Gilda Crease, MD 12/21/15 [phone]

## 2015-12-21 NOTE — ED Notes (Signed)
The pt has beem having mid-chest pain for several days he was seen here yesterday for the same and sent home.  The pt  Reports that he has been having chest pain sinced he leftg here  He has had aspirin  325mg  po one hour ago  And 2 sl nitro.

## 2015-12-21 NOTE — Progress Notes (Signed)
Pt admitted this am by Dr Tresa EndoKelly (his note reviewed). Currently pain free.  No dynamic ekg changes though history is worrisome for [place]. Will place on cath board for elective cath Tuesday  Hillis RangeJames Jordain Radin MD, [facility] Center[facility]ACC 12/21/2015 8:55 AM

## 2015-12-21 NOTE — ED Notes (Signed)
Attempted to call report

## 2015-12-21 NOTE — Progress Notes (Signed)
  Echocardiogram 2D Echocardiogram has been performed.  Leta JunglingCooper, Letricia Krinsky M 12/21/2015, 12:14 PM

## 2015-12-21 NOTE — Progress Notes (Signed)
ANTICOAGULATION CONSULT NOTE - Initial Consult  Pharmacy Consult for Heparin  Indication: chest pain/ACS  No Known Allergies  Patient Measurements: Height: 5\' 9"  (175.3 cm) Weight: 195 lb (88.451 kg) IBW/kg (Calculated) : 70.7  Vital Signs: Temp: 98.4 F (36.9 C) (01/01 0224) BP: 159/82 mmHg (01/01 0415) Pulse Rate: 50 (01/01 0415)   Labs:  Recent Labs  12/20/15 0925 12/21/15 0241  HGB 17.3* 16.5  HCT 50.3 49.3  PLT 186 195  CREATININE 0.99 1.00  TROPONINI  --  0.04*   Estimated Creatinine Clearance: 83.2 mL/min (by C-G formula based on Cr of 1).  Medical History: Past Medical History  Diagnosis Date  . Kidney stones   . CAD (coronary artery disease)   . Hyperlipidemia    Assessment: 65 y/o M to start heparin per pharmacy for CP, CBC/renal function good, other labs/meds reviewed.   Goal of Therapy:  Heparin level 0.3-0.7 units/ml Monitor platelets by anticoagulation protocol: Yes   Plan:  Heparin 4000 units BOLUS Start heparin drip at 1200 units/hr 1300 HL Daily CBC/HL Monitor for bleeding  Adrian Foley, Adrian Foley 12/21/2015,4:49 AM

## 2015-12-21 NOTE — Progress Notes (Signed)
ANTICOAGULATION CONSULT NOTE  Pharmacy Consult for Heparin  Indication: chest pain/ACS  No Known Allergies  Patient Measurements: Height: 5\' 9"  (175.3 cm) Weight: 200 lb 4.8 oz (90.855 kg) IBW/kg (Calculated) : 70.7  Vital Signs: Temp: 97.9 F (36.6 C) (01/01 2034) Temp Source: Oral (01/01 2034) BP: 109/69 mmHg (01/01 1021) Pulse Rate: 66 (01/01 1021)   Labs:  Recent Labs  12/20/15 0925  12/21/15 0241 12/21/15 0650 12/21/15 1256 12/21/15 1800 12/21/15 2041  HGB 17.3*  --  16.5  --   --   --   --   HCT 50.3  --  49.3  --   --   --   --   PLT 186  --  195  --   --   --   --   LABPROT  --   --   --  14.3  --   --   --   INR  --   --   --  1.09  --   --   --   HEPARINUNFRC  --   --   --   --  <0.10*  --  0.24*  CREATININE 0.99  --  1.00 0.93  --   --   --   TROPONINI  --   < > 0.04* 0.04* 0.22* 0.36*  --   < > = values in this interval not displayed. Estimated Creatinine Clearance: 90.6 mL/min (by C-G formula based on Cr of 0.93).  Medical History: Past Medical History  Diagnosis Date  . Kidney stones   . CAD (coronary artery disease)   . Hyperlipidemia    Assessment: 65 y/o M receiving heparin per pharmacy for CP. He is noted for cath on Tuesday -HL 0.24; No infusion issues noted per RN -CBC stable  Goal of Therapy:  Heparin level 0.3-0.7 units/ml Monitor platelets by anticoagulation protocol: Yes   Plan:  Increase heparin drip to 1650 units/hr Heparin level in 6 hrs Daily CBC/HL  Estella HuskMichelle Tomasz Steeves, Pharm.D., BCPS, AAHIVP Clinical Pharmacist Phone: [phone] or [phone] 12/21/2015, 9:20 PM

## 2015-12-21 NOTE — Progress Notes (Signed)
ANTICOAGULATION CONSULT NOTE  Pharmacy Consult for Heparin  Indication: chest pain/ACS  No Known Allergies  Patient Measurements: Height: 5\' 9"  (175.3 cm) Weight: 200 lb 4.8 oz (90.855 kg) IBW/kg (Calculated) : 70.7  Vital Signs: Temp: 97.8 F (36.6 C) (01/01 0633) Temp Source: Oral (01/01 16100633) BP: 109/69 mmHg (01/01 1021) Pulse Rate: 66 (01/01 1021)   Labs:  Recent Labs  12/20/15 0925 12/21/15 0241 12/21/15 0650 12/21/15 1256  HGB 17.3* 16.5  --   --   HCT 50.3 49.3  --   --   PLT 186 195  --   --   LABPROT  --   --  14.3  --   INR  --   --  1.09  --   HEPARINUNFRC  --   --   --  <0.10*  CREATININE 0.99 1.00 0.93  --   TROPONINI  --  0.04* 0.04* 0.22*   Estimated Creatinine Clearance: 90.6 mL/min (by C-G formula based on Cr of 0.93).  Medical History: Past Medical History  Diagnosis Date  . Kidney stones   . CAD (coronary artery disease)   . Hyperlipidemia    Assessment: 65 y/o M to start heparin per pharmacy for CP. He is noted for cath on Tuesday -HL < 0.1; No infusion issues noted per RN -CBC stable   Goal of Therapy:  Heparin level 0.3-0.7 units/ml Monitor platelets by anticoagulation protocol: Yes   Plan:  Heparin 2000 units bolus Increase heparin drip to 1500 units/hr Heparin level in 6 hrs Daily CBC/HL  Harland GermanAndrew Collin Rengel, Pharm D 12/21/2015 2:22 PM

## 2015-12-22 DIAGNOSIS — I214 Non-ST elevation (NSTEMI) myocardial infarction: Principal | ICD-10-CM

## 2015-12-22 DIAGNOSIS — E785 Hyperlipidemia, unspecified: Secondary | ICD-10-CM

## 2015-12-22 DIAGNOSIS — I25118 Atherosclerotic heart disease of native coronary artery with other forms of angina pectoris: Secondary | ICD-10-CM

## 2015-12-22 DIAGNOSIS — E1159 Type 2 diabetes mellitus with other circulatory complications: Secondary | ICD-10-CM

## 2015-12-22 LAB — HEPARIN LEVEL (UNFRACTIONATED)
Heparin Unfractionated: 0.33 IU/mL (ref 0.30–0.70)
Heparin Unfractionated: 0.37 IU/mL (ref 0.30–0.70)

## 2015-12-22 LAB — LIPID PANEL
Cholesterol: 194 mg/dL (ref 0–200)
HDL: 30 mg/dL — AB (ref >40)
LDL Cholesterol: UNDETERMINED mg/dL (ref 0–99)
TRIGLYCERIDES: 470 mg/dL — AB (ref <150)
Total CHOL/HDL Ratio: 6.5 RATIO
VLDL: UNDETERMINED mg/dL (ref 0–40)

## 2015-12-22 LAB — BASIC METABOLIC PANEL
ANION GAP: 10 (ref 5–15)
BUN: 14 mg/dL (ref 6–20)
CHLORIDE: 106 mmol/L (ref 101–111)
CO2: 23 mmol/L (ref 22–32)
CREATININE: 0.93 mg/dL (ref 0.61–1.24)
Calcium: 8.9 mg/dL (ref 8.9–10.3)
GFR calc non Af Amer: >60 mL/min (ref >60)
Glucose, Bld: 134 mg/dL — ABNORMAL HIGH (ref 65–99)
Potassium: 4 mmol/L (ref 3.5–5.1)
Sodium: 139 mmol/L (ref 135–145)

## 2015-12-22 LAB — CBC
HEMATOCRIT: 46.2 % (ref 39.0–52.0)
HEMOGLOBIN: 16 g/dL (ref 13.0–17.0)
MCH: 30.7 pg (ref 26.0–34.0)
MCHC: 34.6 g/dL (ref 30.0–36.0)
MCV: 88.5 fL (ref 78.0–100.0)
Platelets: 187 10*3/uL (ref 150–400)
RBC: 5.22 MIL/uL (ref 4.22–5.81)
RDW: 13 % (ref 11.5–15.5)
WBC: 7.9 10*3/uL (ref 4.0–10.5)

## 2015-12-22 MED ORDER — SODIUM CHLORIDE 0.9 % WEIGHT BASED INFUSION
1.0000 mL/kg/h | INTRAVENOUS | Status: DC
  Administered 2015-12-23: 1 mL/kg/h via INTRAVENOUS
  Administered 2015-12-23: 250 mL via INTRAVENOUS
  Administered 2015-12-23: 1 mL/kg/h via INTRAVENOUS

## 2015-12-22 MED ORDER — SODIUM CHLORIDE 0.9 % WEIGHT BASED INFUSION: 3.0000 mL/kg/h | INTRAVENOUS | Status: DC

## 2015-12-22 MED ORDER — SODIUM CHLORIDE 0.9 % IJ SOLN: 3.0000 mL | INTRAMUSCULAR | Status: DC | PRN

## 2015-12-22 MED ORDER — SODIUM CHLORIDE 0.9 % IJ SOLN
3.0000 mL | Freq: Two times a day (BID) | INTRAMUSCULAR | Status: DC
  Administered 2015-12-23: 3 mL via INTRAVENOUS

## 2015-12-22 MED ORDER — SODIUM CHLORIDE 0.9 % WEIGHT BASED INFUSION
3.0000 mL/kg/h | INTRAVENOUS | Status: DC
  Administered 2015-12-23: 3 mL/kg/h via INTRAVENOUS

## 2015-12-22 MED ORDER — SODIUM CHLORIDE 0.9 % WEIGHT BASED INFUSION: 1.0000 mL/kg/h | INTRAVENOUS | Status: DC

## 2015-12-22 MED ORDER — SODIUM CHLORIDE 0.9 % IV SOLN: 250.0000 mL | INTRAVENOUS | Status: DC | PRN

## 2015-12-22 NOTE — Progress Notes (Signed)
ANTICOAGULATION CONSULT NOTE  Pharmacy Consult for Heparin  Indication: chest pain/ACS  No Known Allergies  Patient Measurements: Height: 5\' 9"  (175.3 cm) Weight: 200 lb 4.8 oz (90.855 kg) IBW/kg (Calculated) : 70.7  Vital Signs: Temp: 98 F (36.7 C) (01/02 0030) Temp Source: Oral (01/02 0030) BP: 105/67 mmHg (01/02 0030) Pulse Rate: 54 (01/02 0030)   Labs:  Recent Labs  12/20/15 0925  12/21/15 0241 12/21/15 0650 12/21/15 1256 12/21/15 1800 12/21/15 2041 12/22/15 0329  HGB 17.3*  --  16.5  --   --   --   --  16.0  HCT 50.3  --  49.3  --   --   --   --  46.2  PLT 186  --  195  --   --   --   --  187  LABPROT  --   --   --  14.3  --   --   --   --   INR  --   --   --  1.09  --   --   --   --   HEPARINUNFRC  --   --   --   --  <0.10*  --  0.24* 0.33  CREATININE 0.99  --  1.00 0.93  --   --   --   --   TROPONINI  --   < > 0.04* 0.04* 0.22* 0.36*  --   --   < > = values in this interval not displayed. Estimated Creatinine Clearance: 90.6 mL/min (by C-G formula based on Cr of 0.93).  Medical History: Past Medical History  Diagnosis Date  . Kidney stones   . CAD (coronary artery disease)   . Hyperlipidemia    Assessment: 65 y/o M receiving heparin per pharmacy for CP. He is noted for cath on Tuesday -HL 0.33 (therapeutic); No infusion issues noted per RN -CBC stable  Goal of Therapy:  Heparin level 0.3-0.7 units/ml Monitor platelets by anticoagulation protocol: Yes   Plan:  Increase heparin drip to 1700 units/hr Heparin level in 6 hrs Daily CBC/HL  Vinnie LevelBenjamin Margarethe Virgen, PharmD., BCPS Clinical Pharmacist Pager [phone]

## 2015-12-22 NOTE — Progress Notes (Signed)
UR Completed Harlym Gehling Graves-Bigelow, RN,BSN [phone]  

## 2015-12-22 NOTE — Care Management Note (Addendum)
Case Management Note  Patient Details  Name: Adrian Foley MRN: [id number] Date of Birth: 06/18/1951  Subjective/Objective:Pt admitted for chest pain-Hx CAD. Plan for Cardiac Cath 12-23-15.            Action/Plan:CM did speak with pt in regards to disposition needs. [facility] has [facility] [place] [facility] [place]. CM will continue to monitor for medication needs post cath.     Expected Discharge Date:                  Expected Discharge Plan:  Home/Self Care  In-House Referral:  NA  Discharge planning Services  CM Consult, Medication Assistance  Post Acute Care Choice:    Choice offered to:  NA  DME Arranged:  N/A DME Agency:  NA  HH Arranged:  NA HH Agency:  NA  Status of Service:  In process, will continue to follow  Medicare Important Message Given:    Date Medicare IM Given:    Medicare IM give by:    Date Additional Medicare IM Given:    Additional Medicare Important Message give by:     If discussed at Long Length of Stay Meetings, dates discussed: 12-25-15   Additional Comments: 1221 12-25-15 Tomi BambergerBrenda Graves-Bigelow, RN,BSN [phone] Pt was discussed in the [facility] meting. Plan for CABG on Monday. CM will continue to monitor for additional disposition needs.   Gala LewandowskyGraves-Bigelow, Mona Ayars Kaye, RN 12/22/2015, 9:51 AM

## 2015-12-22 NOTE — Progress Notes (Signed)
ANTICOAGULATION CONSULT NOTE  Pharmacy Consult for Heparin  Indication: chest pain/ACS  No Known Allergies  Patient Measurements: Height: 5\' 9"  (175.3 cm) Weight: 201 lb 6.4 oz (91.354 kg) IBW/kg (Calculated) : 70.7  Vital Signs: Temp: 97.5 F (36.4 C) (01/02 0520) Temp Source: Oral (01/02 0520) BP: 115/71 mmHg (01/02 0946) Pulse Rate: 62 (01/02 0946)   Labs:  Recent Labs  12/20/15 0925  12/21/15 0241 12/21/15 0650  12/21/15 1256 12/21/15 1800 12/21/15 2041 12/22/15 0329 12/22/15 1255  HGB 17.3*  --  16.5  --   --   --   --   --  16.0  --   HCT 50.3  --  49.3  --   --   --   --   --  46.2  --   PLT 186  --  195  --   --   --   --   --  187  --   LABPROT  --   --   --  14.3  --   --   --   --   --   --   INR  --   --   --  1.09  --   --   --   --   --   --   HEPARINUNFRC  --   --   --   --   < > <0.10*  --  0.24* 0.33 0.37  CREATININE 0.99  --  1.00 0.93  --   --   --   --  0.93  --   TROPONINI  --   < > 0.04* 0.04*  --  0.22* 0.36*  --   --   --   < > = values in this interval not displayed. Estimated Creatinine Clearance: 90.8 mL/min (by C-G formula based on Cr of 0.93).  Medical History: Past Medical History  Diagnosis Date  . Kidney stones   . CAD (coronary artery disease)   . Hyperlipidemia    Assessment: 65 y/o M receiving heparin per pharmacy for CP. He is noted for cath on Tuesday -HL therapeutic x 2 ; No infusion issues noted per RN -CBC stable  Goal of Therapy:  Heparin level 0.3-0.7 units/ml Monitor platelets by anticoagulation protocol: Yes   Plan:  Continue heparin at 1700 units / hr Daily CBC/HL  Thank you Okey RegalLisa Margy Sumler, PharmD [phone]

## 2015-12-22 NOTE — Progress Notes (Signed)
PATIENT ID: [id number] with CAD s/p PCI, hyperlipidemia, and diabetes mellitus type 2 here with NSTEMI.  INTERVAL HISTORY: No events overnight.  SUBJECTIVE:  Feels well. Denies chest pain or shortness of breath.   PHYSICAL EXAM Filed Vitals:   12/21/15 2126 12/22/15 0030 12/22/15 0520 12/22/15 0800  BP: 103/63 105/67 115/71   Pulse: 70 54 66   Temp:  98 F (36.7 Foley) 97.5 F (36.4 Foley)   TempSrc:  Oral Oral   Resp:  18 18   Height:      Weight:   91.354 kg (201 lb 6.4 oz)   SpO2:  95% 95% 96%   General:  Well appearing. No acute distress. Neck: No JVD. No carotid bruits. Lungs:  Clear to auscultation bilaterally. No crackles, rhonchi, or wheezes. Heart:  Regular in rate and rhythm. No murmurs, rubs, or gallops. Normal S1/S2. Abdomen:  Soft, nontender, nondistended Extremities:  Warm and well perfused. No clubbing, cyanosis, or edema. 2+ DP/TP pulses bilaterally.  LABS: Lab Results  Component Value Date   TROPONINI 0.36* 12/21/2015   Results for orders placed or performed during the hospital encounter of 12/21/15 (from the past 24 hour(s))  Heparin level (unfractionated)     Status: Abnormal   Collection Time: 12/21/15 12:56 PM  Result Value Ref Range   Heparin Unfractionated <0.10 (L) 0.30 - 0.70 IU/mL  Troponin I     Status: Abnormal   Collection Time: 12/21/15 12:56 PM  Result Value Ref Range   Troponin I 0.22 (H) <0.031 ng/mL  Troponin I     Status: Abnormal   Collection Time: 12/21/15  6:00 PM  Result Value Ref Range   Troponin I 0.36 (H) <0.031 ng/mL  Heparin level (unfractionated)     Status: Abnormal   Collection Time: 12/21/15  8:41 PM  Result Value Ref Range   Heparin Unfractionated 0.24 (L) 0.30 - 0.70 IU/mL  CBC     Status: None   Collection Time: 12/22/15  3:29 AM  Result Value Ref Range   WBC 7.9 4.0 - 10.5 K/uL   RBC 5.22 4.22 - 5.81 MIL/uL   Hemoglobin 16.0 13.0 - 17.0 g/dL   HCT 11.9 14.7 - 82.9 %   MCV 88.5 78.0 - 100.0 fL   MCH 30.7 26.0 -  34.0 pg   MCHC 34.6 30.0 - 36.0 g/dL   RDW 56.2 13.0 - 86.5 %   Platelets 187 150 - 400 K/uL  Basic metabolic panel     Status: Abnormal   Collection Time: 12/22/15  3:29 AM  Result Value Ref Range   Sodium 139 135 - 145 mmol/L   Potassium 4.0 3.5 - 5.1 mmol/L   Chloride 106 101 - 111 mmol/L   CO2 23 22 - 32 mmol/L   Glucose, Bld 134 (H) 65 - 99 mg/dL   BUN 14 6 - 20 mg/dL   Creatinine, Ser 7.84 0.61 - 1.24 mg/dL   Calcium 8.9 8.9 - 69.6 mg/dL   GFR calc non Af Amer >60 >60 mL/min   GFR calc Af Amer >60 >60 mL/min   Anion gap 10 5 - 15  Lipid panel     Status: Abnormal   Collection Time: 12/22/15  3:29 AM  Result Value Ref Range   Cholesterol 194 0 - 200 mg/dL   Triglycerides 295 (H) <150 mg/dL   HDL 30 (L) >28 mg/dL   Total CHOL/HDL Ratio 6.5 RATIO   VLDL UNABLE TO CALCULATE IF TRIGLYCERIDE OVER  400 mg/dL 0 - 40 mg/dL   LDL Cholesterol UNABLE TO CALCULATE IF TRIGLYCERIDE OVER 400 mg/dL 0 - 99 mg/dL  Heparin level (unfractionated)     Status: None   Collection Time: 12/22/15  3:29 AM  Result Value Ref Range   Heparin Unfractionated 0.33 0.30 - 0.70 IU/mL    Intake/Output Summary (Last 24 hours) at 12/22/15 0942 Last data filed at 12/22/15 0900  Gross per 24 hour  Intake   1316 ml  Output   1025 ml  Net    291 ml    EKG:  Sinus rhythm. Rate 72 bpm. Nonspecific ST/T changes.  Telemetry: Normal sinus rhythm. Occasional PVCs. Sinus bradycardia in the 50s overnight.  Echo 12/21/15: Study Conclusions  - Left ventricle: The cavity size was normal. Wall thickness was normal. Systolic function was normal. The estimated ejection fraction was in the range of 55% to 60%. Possible hypokinesis of the apicalinferoseptal myocardium. Doppler parameters are consistent with abnormal left ventricular relaxation (grade 1 diastolic dysfunction). - Aortic root: The aortic root was mildly dilated. - Mitral valve: Mildly calcified leaflets . There was  trivial regurgitation. - Tricuspid valve: There was trivial regurgitation. - Pulmonary arteries: Systolic pressure could not be accurately estimated. - Pericardium, extracardiac: There was no pericardial effusion.  Impressions:  - Normal LV wall thickness with LVEF 55-60%, possible apical inferoseptal hypokinesis, grade 1 diastolic dysfunction. Mildly dilated aortic root. Trivial mitral and tricuspid regurgitation.  Transthoracic echocardiography. M-mode, complete 2D, spectral Doppler, and color Doppler. Birthdate: Patient birthdate: 1951/09/25. Age: Patient is 65 yr old. Sex: Gender: male. BMI: 28.8 kg/m^2. Blood pressure:   120/70 Patient status: Inpatient. Study date: Study date: 12/21/2015. Study time: 11:41 AM. Location: Bedside.   ASSESSMENT AND PLAN:  Principal Problem:   Unstable angina (HCC) Active Problems:   Dyslipidemia   Type 2 diabetes mellitus with vascular disease (HCC)   CAD (coronary artery disease), native coronary artery   1. CAD/NSTEMI: Troponin elevated to 0.36. [name] is currently asymptomatic.  Continue aspirin, atorvastatin, and metoprolol. He is currently on a heparin continuous infusion. Plan for left heart catheterization tomorrow.  LVEF 55-60% on echo.  Risks and benefits of cardiac catheterization have been discussed with the patient.  The patient understands that risks included but are not limited to stroke (1 in 1000), death (1 in 1000), kidney failure [usually temporary] (1 in 500), bleeding (1 in 200), allergic reaction [possibly serious] (1 in 200). The patient understands and agrees to proceed.   2. Hyperlipidemia: Continue atorvastatin 80 mg daily. Triglycerides 470.  Add fenofibrate.  3. Diabetes mellitus: A1c pending.  Glucose ranging from 134-162.  Diet controlled.   Adrian Foley. Duke Salviaandolph, MD, [facility] CenterFACC 12/22/2015 9:42 AM

## 2015-12-23 ENCOUNTER — Other Ambulatory Visit: Payer: Self-pay | Admitting: *Deleted

## 2015-12-23 ENCOUNTER — Encounter (HOSPITAL_COMMUNITY): Payer: Self-pay

## 2015-12-23 ENCOUNTER — Encounter (HOSPITAL_COMMUNITY): Payer: Self-pay | Admitting: General Practice

## 2015-12-23 ENCOUNTER — Encounter (HOSPITAL_COMMUNITY)
Admission: EM | Disposition: A | Discharge status: Home / Self Care | Payer: Self-pay | Source: Home / Self Care | Attending: Internal Medicine

## 2015-12-23 DIAGNOSIS — I2510 Atherosclerotic heart disease of native coronary artery without angina pectoris: Secondary | ICD-10-CM

## 2015-12-23 DIAGNOSIS — Z9861 Coronary angioplasty status: Secondary | ICD-10-CM

## 2015-12-23 HISTORY — PX: CARDIAC CATHETERIZATION: SHX172

## 2015-12-23 LAB — BASIC METABOLIC PANEL
Anion gap: 8 (ref 5–15)
BUN: 11 mg/dL (ref 6–20)
CALCIUM: 8.8 mg/dL — AB (ref 8.9–10.3)
CO2: 25 mmol/L (ref 22–32)
CREATININE: 1.01 mg/dL (ref 0.61–1.24)
Chloride: 104 mmol/L (ref 101–111)
GFR calc non Af Amer: >60 mL/min (ref >60)
GLUCOSE: 145 mg/dL — AB (ref 65–99)
Potassium: 3.8 mmol/L (ref 3.5–5.1)
Sodium: 137 mmol/L (ref 135–145)

## 2015-12-23 LAB — CBC
HCT: 46.4 % (ref 39.0–52.0)
Hemoglobin: 16.1 g/dL (ref 13.0–17.0)
MCH: 30.6 pg (ref 26.0–34.0)
MCHC: 34.7 g/dL (ref 30.0–36.0)
MCV: 88.2 fL (ref 78.0–100.0)
PLATELETS: 183 10*3/uL (ref 150–400)
RBC: 5.26 MIL/uL (ref 4.22–5.81)
RDW: 12.9 % (ref 11.5–15.5)
WBC: 6.5 10*3/uL (ref 4.0–10.5)

## 2015-12-23 LAB — HEMOGLOBIN A1C
HEMOGLOBIN A1C: 6.7 % — AB (ref 4.8–5.6)
Mean Plasma Glucose: 146 mg/dL

## 2015-12-23 LAB — HEPARIN LEVEL (UNFRACTIONATED)
HEPARIN UNFRACTIONATED: 0.4 [IU]/mL (ref 0.30–0.70)
Heparin Unfractionated: 0.2 IU/mL — ABNORMAL LOW (ref 0.30–0.70)

## 2015-12-23 SURGERY — LEFT HEART CATH AND CORONARY ANGIOGRAPHY
Anesthesia: LOCAL

## 2015-12-23 MED ORDER — HEPARIN SODIUM (PORCINE) 1000 UNIT/ML IJ SOLN
INTRAMUSCULAR | Status: DC | PRN
  Administered 2015-12-23: 5000 [IU] via INTRAVENOUS

## 2015-12-23 MED ORDER — VERAPAMIL HCL 2.5 MG/ML IV SOLN
INTRA_ARTERIAL | Status: DC | PRN
  Administered 2015-12-23: 20 mL via INTRA_ARTERIAL

## 2015-12-23 MED ORDER — LIDOCAINE HCL (PF) 1 % IJ SOLN
INTRAMUSCULAR | Status: DC | PRN
  Administered 2015-12-23: 11:00:00

## 2015-12-23 MED ORDER — HEPARIN (PORCINE) IN NACL 100-0.45 UNIT/ML-% IJ SOLN
1850.0000 [IU]/h | INTRAMUSCULAR | Status: DC
  Administered 2015-12-23: 1700 [IU]/h via INTRAVENOUS
  Administered 2015-12-24 – 2015-12-28 (×6): 1850 [IU]/h via INTRAVENOUS
  Filled 2015-12-23 (×12): qty 250

## 2015-12-23 MED ORDER — LIDOCAINE HCL (PF) 1 % IJ SOLN
INTRAMUSCULAR | Status: AC
  Filled 2015-12-23: qty 30

## 2015-12-23 MED ORDER — SODIUM CHLORIDE 0.9 % IJ SOLN: 3.0000 mL | INTRAMUSCULAR | Status: DC | PRN

## 2015-12-23 MED ORDER — HEPARIN SODIUM (PORCINE) 1000 UNIT/ML IJ SOLN
INTRAMUSCULAR | Status: AC
  Filled 2015-12-23: qty 1

## 2015-12-23 MED ORDER — NITROGLYCERIN 1 MG/10 ML FOR IR/CATH LAB
INTRA_ARTERIAL | Status: AC
  Filled 2015-12-23: qty 10

## 2015-12-23 MED ORDER — HEPARIN (PORCINE) IN NACL 2-0.9 UNIT/ML-% IJ SOLN
INTRAMUSCULAR | Status: AC
  Filled 2015-12-23: qty 1000

## 2015-12-23 MED ORDER — IOHEXOL 350 MG/ML SOLN
INTRAVENOUS | Status: DC | PRN
  Administered 2015-12-23: 95 mL via INTRAVENOUS

## 2015-12-23 MED ORDER — FENTANYL CITRATE (PF) 100 MCG/2ML IJ SOLN
INTRAMUSCULAR | Status: AC
  Filled 2015-12-23: qty 2

## 2015-12-23 MED ORDER — MIDAZOLAM HCL 2 MG/2ML IJ SOLN
INTRAMUSCULAR | Status: AC
  Filled 2015-12-23: qty 2

## 2015-12-23 MED ORDER — MORPHINE SULFATE (PF) 2 MG/ML IV SOLN
2.0000 mg | INTRAVENOUS | Status: DC | PRN
  Administered 2015-12-25 (×2): 2 mg via INTRAVENOUS
  Filled 2015-12-23 (×2): qty 1

## 2015-12-23 MED ORDER — MIDAZOLAM HCL 2 MG/2ML IJ SOLN
INTRAMUSCULAR | Status: DC | PRN
  Administered 2015-12-23: 2 mg via INTRAVENOUS

## 2015-12-23 MED ORDER — SODIUM CHLORIDE 0.9 % IJ SOLN
3.0000 mL | Freq: Two times a day (BID) | INTRAMUSCULAR | Status: DC
  Administered 2015-12-23 – 2015-12-27 (×6): 3 mL via INTRAVENOUS

## 2015-12-23 MED ORDER — SODIUM CHLORIDE 0.9 % WEIGHT BASED INFUSION
3.0000 mL/kg/h | INTRAVENOUS | Status: AC
  Administered 2015-12-23: 3 mL/kg/h via INTRAVENOUS

## 2015-12-23 MED ORDER — SODIUM CHLORIDE 0.9 % IV SOLN: 250.0000 mL | INTRAVENOUS | Status: DC | PRN

## 2015-12-23 MED ORDER — FENTANYL CITRATE (PF) 100 MCG/2ML IJ SOLN
INTRAMUSCULAR | Status: DC | PRN
  Administered 2015-12-23: 50 ug via INTRAVENOUS

## 2015-12-23 MED ORDER — VERAPAMIL HCL 2.5 MG/ML IV SOLN
INTRAVENOUS | Status: AC
  Filled 2015-12-23: qty 2

## 2015-12-23 SURGICAL SUPPLY — 11 items
CATH INFINITI 5 FR JL3.5 (CATHETERS) ×1 IMPLANT
CATH INFINITI 5FR ANG PIGTAIL (CATHETERS) ×1 IMPLANT
CATH OPTITORQUE TIG 4.0 5F (CATHETERS) ×2 IMPLANT
DEVICE RAD COMP TR BAND LRG (VASCULAR PRODUCTS) ×2 IMPLANT
GLIDESHEATH SLEND A-KIT 6F 22G (SHEATH) ×2 IMPLANT
KIT HEART LEFT (KITS) ×2 IMPLANT
PACK CARDIAC CATHETERIZATION (CUSTOM PROCEDURE TRAY) ×2 IMPLANT
SYR MEDRAD MARK V 150ML (SYRINGE) ×2 IMPLANT
TRANSDUCER W/STOPCOCK (MISCELLANEOUS) ×2 IMPLANT
TUBING CIL FLEX 10 FLL-RA (TUBING) ×2 IMPLANT
WIRE SAFE-T 1.5MM-J .035X260CM (WIRE) ×2 IMPLANT

## 2015-12-23 NOTE — H&P (View-Only) (Signed)
PATIENT ID: [id number] with CAD s/p PCI, hyperlipidemia, and diabetes mellitus type 2 here with NSTEMI.  INTERVAL HISTORY: No events overnight.  SUBJECTIVE:  Feels well. Denies chest pain or shortness of breath.   PHYSICAL EXAM Filed Vitals:   12/21/15 2126 12/22/15 0030 12/22/15 0520 12/22/15 0800  BP: 103/63 105/67 115/71   Pulse: 70 54 66   Temp:  98 F (36.7 C) 97.5 F (36.4 C)   TempSrc:  Oral Oral   Resp:  18 18   Height:      Weight:   91.354 kg (201 lb 6.4 oz)   SpO2:  95% 95% 96%   General:  Well appearing. No acute distress. Neck: No JVD. No carotid bruits. Lungs:  Clear to auscultation bilaterally. No crackles, rhonchi, or wheezes. Heart:  Regular in rate and rhythm. No murmurs, rubs, or gallops. Normal S1/S2. Abdomen:  Soft, nontender, nondistended Extremities:  Warm and well perfused. No clubbing, cyanosis, or edema. 2+ DP/TP pulses bilaterally.  LABS: Lab Results  Component Value Date   TROPONINI 0.36* 12/21/2015   Results for orders placed or performed during the hospital encounter of 12/21/15 (from the past 24 hour(s))  Heparin level (unfractionated)     Status: Abnormal   Collection Time: 12/21/15 12:56 PM  Result Value Ref Range   Heparin Unfractionated <0.10 (L) 0.30 - 0.70 IU/mL  Troponin I     Status: Abnormal   Collection Time: 12/21/15 12:56 PM  Result Value Ref Range   Troponin I 0.22 (H) <0.031 ng/mL  Troponin I     Status: Abnormal   Collection Time: 12/21/15  6:00 PM  Result Value Ref Range   Troponin I 0.36 (H) <0.031 ng/mL  Heparin level (unfractionated)     Status: Abnormal   Collection Time: 12/21/15  8:41 PM  Result Value Ref Range   Heparin Unfractionated 0.24 (L) 0.30 - 0.70 IU/mL  CBC     Status: None   Collection Time: 12/22/15  3:29 AM  Result Value Ref Range   WBC 7.9 4.0 - 10.5 K/uL   RBC 5.22 4.22 - 5.81 MIL/uL   Hemoglobin 16.0 13.0 - 17.0 g/dL   HCT 11.9 14.7 - 82.9 %   MCV 88.5 78.0 - 100.0 fL   MCH 30.7 26.0 -  34.0 pg   MCHC 34.6 30.0 - 36.0 g/dL   RDW 56.2 13.0 - 86.5 %   Platelets 187 150 - 400 K/uL  Basic metabolic panel     Status: Abnormal   Collection Time: 12/22/15  3:29 AM  Result Value Ref Range   Sodium 139 135 - 145 mmol/L   Potassium 4.0 3.5 - 5.1 mmol/L   Chloride 106 101 - 111 mmol/L   CO2 23 22 - 32 mmol/L   Glucose, Bld 134 (H) 65 - 99 mg/dL   BUN 14 6 - 20 mg/dL   Creatinine, Ser 7.84 0.61 - 1.24 mg/dL   Calcium 8.9 8.9 - 69.6 mg/dL   GFR calc non Af Amer >60 >60 mL/min   GFR calc Af Amer >60 >60 mL/min   Anion gap 10 5 - 15  Lipid panel     Status: Abnormal   Collection Time: 12/22/15  3:29 AM  Result Value Ref Range   Cholesterol 194 0 - 200 mg/dL   Triglycerides 295 (H) <150 mg/dL   HDL 30 (L) >28 mg/dL   Total CHOL/HDL Ratio 6.5 RATIO   VLDL UNABLE TO CALCULATE IF TRIGLYCERIDE OVER  400 mg/dL 0 - 40 mg/dL   LDL Cholesterol UNABLE TO CALCULATE IF TRIGLYCERIDE OVER 400 mg/dL 0 - 99 mg/dL  Heparin level (unfractionated)     Status: None   Collection Time: 12/22/15  3:29 AM  Result Value Ref Range   Heparin Unfractionated 0.33 0.30 - 0.70 IU/mL    Intake/Output Summary (Last 24 hours) at 12/22/15 0942 Last data filed at 12/22/15 0900  Gross per 24 hour  Intake   1316 ml  Output   1025 ml  Net    291 ml    EKG:  Sinus rhythm. Rate 72 bpm. Nonspecific ST/T changes.  Telemetry: Normal sinus rhythm. Occasional PVCs. Sinus bradycardia in the 50s overnight.  Echo 12/21/15: Study Conclusions  - Left ventricle: The cavity size was normal. Wall thickness was normal. Systolic function was normal. The estimated ejection fraction was in the range of 55% to 60%. Possible hypokinesis of the apicalinferoseptal myocardium. Doppler parameters are consistent with abnormal left ventricular relaxation (grade 1 diastolic dysfunction). - Aortic root: The aortic root was mildly dilated. - Mitral valve: Mildly calcified leaflets . There was  trivial regurgitation. - Tricuspid valve: There was trivial regurgitation. - Pulmonary arteries: Systolic pressure could not be accurately estimated. - Pericardium, extracardiac: There was no pericardial effusion.  Impressions:  - Normal LV wall thickness with LVEF 55-60%, possible apical inferoseptal hypokinesis, grade 1 diastolic dysfunction. Mildly dilated aortic root. Trivial mitral and tricuspid regurgitation.  Transthoracic echocardiography. M-mode, complete 2D, spectral Doppler, and color Doppler. Birthdate: Patient birthdate: 1951/09/25. Age: Patient is 65 yr old. Sex: Gender: male. BMI: 28.8 kg/m^2. Blood pressure:   120/70 Patient status: Inpatient. Study date: Study date: 12/21/2015. Study time: 11:41 AM. Location: Bedside.   ASSESSMENT AND PLAN:  Principal Problem:   Unstable angina (HCC) Active Problems:   Dyslipidemia   Type 2 diabetes mellitus with vascular disease (HCC)   CAD (coronary artery disease), native coronary artery   1. CAD/NSTEMI: Troponin elevated to 0.36. [name] is currently asymptomatic.  Continue aspirin, atorvastatin, and metoprolol. He is currently on a heparin continuous infusion. Plan for left heart catheterization tomorrow.  LVEF 55-60% on echo.  Risks and benefits of cardiac catheterization have been discussed with the patient.  The patient understands that risks included but are not limited to stroke (1 in 1000), death (1 in 1000), kidney failure [usually temporary] (1 in 500), bleeding (1 in 200), allergic reaction [possibly serious] (1 in 200). The patient understands and agrees to proceed.   2. Hyperlipidemia: Continue atorvastatin 80 mg daily. Triglycerides 470.  Add fenofibrate.  3. Diabetes mellitus: A1c pending.  Glucose ranging from 134-162.  Diet controlled.   Adrian Foley Salviaandolph, MD, [facility] CenterFACC 12/22/2015 9:42 AM

## 2015-12-23 NOTE — Interval H&P Note (Signed)
History and Physical Interval Note:  12/23/2015 10:20 AM  Adrian Foley  has presented today for surgery, with the diagnosis of NSTEMI.   The various methods of treatment have been discussed with the patient and family. After consideration of risks, benefits and other options for treatment, the patient has consented to  Procedure(s) with comments: Left Heart Cath and Coronary Angiography (N/A) - any available cath attending - With Possible Percutaneous Coronary Intervention as a surgical intervention .  The patient's history has been reviewed, patient examined, no change in status, stable for surgery.  I have reviewed the patient's chart and labs.  Questions were answered to the patient's satisfaction.    Cath Lab Visit (complete for each Cath Lab visit)  Clinical Evaluation Leading to the Procedure:   ACS: Yes.    Non-ACS:    Anginal Classification: CCS IV  Anti-ischemic medical therapy: Minimal Therapy (1 class of medications)  Non-Invasive Test Results: No non-invasive testing performed  Prior CABG: No previous CABG   AUC:  TIMI Score  Patient Information:  TIMI Score is 4   UA/NSTEMI and intermediate-risk features (e.g., TIMI score 3-4) for short-term risk of death or nonfatal MI  Revascularization of the presumed culprit artery   A (8)  Indication: 10; Score: 8   [name]

## 2015-12-23 NOTE — Progress Notes (Signed)
ANTICOAGULATION CONSULT NOTE- Follow up  Pharmacy Consult for Heparin  Indication: chest pain/ACS  No Known Allergies  Patient Measurements: Height: 5\' 9"  (175.3 cm) Weight: 200 lb (90.719 kg) IBW/kg (Calculated) : 70.7  Vital Signs: Temp: 97.7 F (36.5 C) (01/03 1157) Temp Source: Oral (01/03 1157) BP: 110/77 mmHg (01/03 1123) Pulse Rate: 57 (01/03 1123)   Labs:  Recent Labs  12/21/15 0241 12/21/15 0650 12/21/15 1256 12/21/15 1800  12/22/15 0329 12/22/15 1255 12/23/15 0357  HGB 16.5  --   --   --   --  16.0  --  16.1  HCT 49.3  --   --   --   --  46.2  --  46.4  PLT 195  --   --   --   --  187  --  183  LABPROT  --  14.3  --   --   --   --   --   --   INR  --  1.09  --   --   --   --   --   --   HEPARINUNFRC  --   --  <0.10*  --   < > 0.33 0.37 0.40  CREATININE 1.00 0.93  --   --   --  0.93  --  1.01  TROPONINI 0.04* 0.04* 0.22* 0.36*  --   --   --   --   < > = values in this interval not displayed. Estimated Creatinine Clearance: 83.3 mL/min (by C-G formula based on Cr of 1.01).  Medical History: Past Medical History  Diagnosis Date  . Kidney stones   . CAD (coronary artery disease)   . Hyperlipidemia   . Diabetes mellitus without complication (HCC)     type 2   . NSTEMI (non-ST elevated myocardial infarction) [facility](HCC)    Assessment: 65 y/o s/p cardiac cath today.  Prior to cath this AM the heparin level was 0.40, therapeutic on IV heparin drip at 1700 units/hr per pharmacy for CP/NSTEMI. CBC stable.  Now s/p cardiac cath today-> severe 3 vessel CAD per cath lab report to RN. TCTS consulted. Heparin to resume 6 hours after sheath removal.  Sheath removed at 11:20, no bleeding and no hematoma.   Goal of Therapy:  Heparin level 0.3-0.7 units/ml Monitor platelets by anticoagulation protocol: Yes   Plan:  Resume heparin IV drip 6 hr post sheath removal. At 17:30 will resume heparin at 1700 units / hr Check 6 hour heparin level Daily CBC/HL  Noah Delaineuth Jasminne Mealy,  RPh Clinical Pharmacist Pager: [phone] 12/23/2015 12:14 PM

## 2015-12-23 NOTE — Plan of Care (Signed)
Problem: Safety: Goal: Ability to remain free from injury will improve Outcome: Completed/Met Date Met:  12/23/15 Patient is independent at baseline. Patient does not get up without staff assistance due to IV pole. [name] call bell within reach; wife at bedside with patient.   Problem: Pain Managment: Goal: General experience of comfort will improve Outcome: Progressing Patient is on a nitroglycerin drip to prevent chest pain. Patient does endorse some slight chest pressure with activity (ambulating to bathroom) but does not have any pain/pressure at rest.  Problem: Tissue Perfusion: Goal: Risk factors for ineffective tissue perfusion will decrease Outcome: Progressing Patient is currently on a heparin drip due to chest pain and elevated troponins. Heparin drip will serve as VTE prophylaxis.

## 2015-12-24 ENCOUNTER — Inpatient Hospital Stay (HOSPITAL_COMMUNITY): Payer: Self-pay

## 2015-12-24 DIAGNOSIS — I2510 Atherosclerotic heart disease of native coronary artery without angina pectoris: Secondary | ICD-10-CM

## 2015-12-24 DIAGNOSIS — I25110 Atherosclerotic heart disease of native coronary artery with unstable angina pectoris: Secondary | ICD-10-CM

## 2015-12-24 LAB — HEPARIN LEVEL (UNFRACTIONATED)
HEPARIN UNFRACTIONATED: 0.44 [IU]/mL (ref 0.30–0.70)
HEPARIN UNFRACTIONATED: 0.39 [IU]/mL (ref 0.30–0.70)

## 2015-12-24 LAB — CBC
HCT: 44.1 % (ref 39.0–52.0)
Hemoglobin: 15.2 g/dL (ref 13.0–17.0)
MCH: 30.3 pg (ref 26.0–34.0)
MCHC: 34.5 g/dL (ref 30.0–36.0)
MCV: 88 fL (ref 78.0–100.0)
PLATELETS: 183 10*3/uL (ref 150–400)
RBC: 5.01 MIL/uL (ref 4.22–5.81)
RDW: 12.9 % (ref 11.5–15.5)
WBC: 6.1 10*3/uL (ref 4.0–10.5)

## 2015-12-24 LAB — PULMONARY FUNCTION TEST
FEF 25-75 POST: 5.09 L/s
FEF 25-75 PRE: 4.34 L/s
FEF2575-%Change-Post: 17 %
FEF2575-%PRED-POST: 187 %
FEF2575-%PRED-PRE: 159 %
FEV1-%Change-Post: 6 %
FEV1-%PRED-POST: 106 %
FEV1-%Pred-Pre: 100 %
FEV1-POST: 3.58 L
FEV1-Pre: 3.37 L
FEV1FVC-%Change-Post: 1 %
FEV1FVC-%PRED-PRE: 111 %
FEV6-%Change-Post: 6 %
FEV6-%PRED-POST: 98 %
FEV6-%Pred-Pre: 92 %
FEV6-POST: 4.19 L
FEV6-Pre: 3.95 L
FEV6FVC-%PRED-POST: 105 %
FEV6FVC-%Pred-Pre: 105 %
FVC-%Change-Post: 4 %
FVC-%Pred-Post: 93 %
FVC-%Pred-Pre: 89 %
FVC-PRE: 4.01 L
FVC-Post: 4.19 L
PRE FEV1/FVC RATIO: 84 %
Post FEV1/FVC ratio: 85 %
Post FEV6/FVC ratio: 100 %
Pre FEV6/FVC Ratio: 100 %

## 2015-12-24 LAB — GLUCOSE, CAPILLARY
GLUCOSE-CAPILLARY: 155 mg/dL — AB (ref 65–99)
GLUCOSE-CAPILLARY: 135 mg/dL — AB (ref 65–99)
GLUCOSE-CAPILLARY: 116 mg/dL — AB (ref 65–99)

## 2015-12-24 LAB — URINALYSIS, ROUTINE W REFLEX MICROSCOPIC
Bilirubin Urine: NEGATIVE
Glucose, UA: NEGATIVE mg/dL
Hgb urine dipstick: NEGATIVE
Ketones, ur: NEGATIVE mg/dL
Leukocytes, UA: NEGATIVE
Nitrite: NEGATIVE
Protein, ur: NEGATIVE mg/dL
Specific Gravity, Urine: 1.015 (ref 1.005–1.030)
pH: 5.5 (ref 5.0–8.0)

## 2015-12-24 LAB — SURGICAL PCR SCREEN
MRSA, PCR: NEGATIVE
Staphylococcus aureus: NEGATIVE

## 2015-12-24 MED ORDER — INSULIN ASPART 100 UNIT/ML ~~LOC~~ SOLN: 0.0000 [IU] | Freq: Every day | SUBCUTANEOUS | Status: DC

## 2015-12-24 MED ORDER — INSULIN ASPART 100 UNIT/ML ~~LOC~~ SOLN
0.0000 [IU] | Freq: Three times a day (TID) | SUBCUTANEOUS | Status: DC
  Administered 2015-12-24: 2 [IU] via SUBCUTANEOUS
  Administered 2015-12-24: 3 [IU] via SUBCUTANEOUS
  Administered 2015-12-25 – 2015-12-26 (×3): 2 [IU] via SUBCUTANEOUS
  Administered 2015-12-27: 3 [IU] via SUBCUTANEOUS
  Administered 2015-12-27: 2 [IU] via SUBCUTANEOUS
  Administered 2015-12-27: 3 [IU] via SUBCUTANEOUS
  Administered 2015-12-28 (×3): 2 [IU] via SUBCUTANEOUS

## 2015-12-24 MED ORDER — ISOSORBIDE MONONITRATE ER 30 MG PO TB24
30.0000 mg | ORAL_TABLET | Freq: Every day | ORAL | Status: DC
  Administered 2015-12-24 – 2015-12-28 (×5): 30 mg via ORAL
  Filled 2015-12-24 (×5): qty 1

## 2015-12-24 MED ORDER — ALBUTEROL SULFATE (2.5 MG/3ML) 0.083% IN NEBU
2.5000 mg | INHALATION_SOLUTION | Freq: Once | RESPIRATORY_TRACT | Status: AC
  Administered 2015-12-24: 2.5 mg via RESPIRATORY_TRACT

## 2015-12-24 NOTE — Progress Notes (Signed)
ANTICOAGULATION CONSULT NOTE- Follow up  Pharmacy Consult for Heparin  Indication: chest pain/ACS  No Known Allergies  Patient Measurements: Height: 5\' 9"  (175.3 cm) Weight: 200 lb (90.719 kg) IBW/kg (Calculated) : 70.7  Vital Signs: Temp: 97.4 F (36.3 C) (01/03 1953) Temp Source: Oral (01/03 1953) BP: 97/64 mmHg (01/03 2335) Pulse Rate: 56 (01/03 2335)   Labs:  Recent Labs  12/21/15 0241 12/21/15 0650 12/21/15 1256 12/21/15 1800  12/22/15 0329 12/22/15 1255 12/23/15 0357 12/23/15 2324  HGB 16.5  --   --   --   --  16.0  --  16.1  --   HCT 49.3  --   --   --   --  46.2  --  46.4  --   PLT 195  --   --   --   --  187  --  183  --   LABPROT  --  14.3  --   --   --   --   --   --   --   INR  --  1.09  --   --   --   --   --   --   --   HEPARINUNFRC  --   --  <0.10*  --   < > 0.33 0.37 0.40 0.20*  CREATININE 1.00 0.93  --   --   --  0.93  --  1.01  --   TROPONINI 0.04* 0.04* 0.22* 0.36*  --   --   --   --   --   < > = values in this interval not displayed. Estimated Creatinine Clearance: 83.3 mL/min (by C-G formula based on Cr of 1.01).  Medical History: Past Medical History  Diagnosis Date  . Kidney stones   . CAD (coronary artery disease)   . Hyperlipidemia   . Diabetes mellitus without complication (HCC)     type 2   . NSTEMI (non-ST elevated myocardial infarction) [facility](HCC)    Assessment: [age] y/o s/p cardiac cath on 12/23/15.  Prior to cath, the heparin level was 0.40, therapeutic on IV heparin drip at 1700 units/hr per pharmacy for CP/NSTEMI. CBC stable.  Now s/p cardiac cath-> severe 3 vessel CAD per cath lab report to RN. TCTS consulted. F/u HL is sub-therapeutic at 0.2.   Goal of Therapy:  Heparin level 0.3-0.7 units/ml Monitor platelets by anticoagulation protocol: Yes   Plan:  Increase heparin to 1850 units / hr. No bolus  Check f/u HL in AM  Daily CBC/HL  Vinnie LevelBenjamin Stephonie Wilcoxen, PharmD., BCPS Clinical Pharmacist Pager [phone]

## 2015-12-24 NOTE — Progress Notes (Addendum)
VASCULAR LAB PRELIMINARY  PRELIMINARY  PRELIMINARY  PRELIMINARY  Precabg Completed.   Bilateral Carotid - No evidence of stenosis noted in both carotid arteries.  [place] Arch Evaluation - Doppler waveforms remain normal with the right radial and ulnar arteries compressions. Left doppler waveforms remain normal with left  radial artery compression,  left ulnar waveforms decreases greater than 50% with compression  ABI waveforms are normal bilaterally at rest.   Jenetta Logesami Yvaine Jankowiak, RVT, RDMS 12/24/2015, 2:47 PM

## 2015-12-24 NOTE — Progress Notes (Signed)
Report called to unit via Delsa GranaLindsey RN and given to Freescale Semicon[employer]llison Charge RN, was able to briefly look at notes, labs, VS, EKG and CXR done in the ED, awaiting patient's arrival into 3W-06.

## 2015-12-24 NOTE — Progress Notes (Signed)
ANTICOAGULATION CONSULT NOTE - Follow Up Consult  Pharmacy Consult for heparin Indication: chest pain/ACS  No Known Allergies  Patient Measurements: Height: 5\' 9"  (175.3 cm) Weight: 201 lb 3.2 oz (91.264 kg) IBW/kg (Calculated) : 70.7  Vital Signs: Temp: 97.6 F (36.4 C) (01/04 0750) Temp Source: Oral (01/04 0750) BP: 122/75 mmHg (01/04 1458) Pulse Rate: 52 (01/04 1153)  Labs:  Recent Labs  12/21/15 1800  12/22/15 0329  12/23/15 0357 12/23/15 2324 12/24/15 0301 12/24/15 0856 12/24/15 1456  HGB  --   < > 16.0  --  16.1  --  15.2  --   --   HCT  --   --  46.2  --  46.4  --  44.1  --   --   PLT  --   --  187  --  183  --  183  --   --   HEPARINUNFRC  --   < > 0.33  < > 0.40 0.20*  --  0.44 0.39  CREATININE  --   --  0.93  --  1.01  --   --   --   --   TROPONINI 0.36*  --   --   --   --   --   --   --   --   < > = values in this interval not displayed.  Estimated Creatinine Clearance: 83.5 mL/min (by C-G formula based on Cr of 1.01).  Assessment: 65 y/o s/p cardiac cath on 12/23/15.Continuing on heparin. Confirmatory HL was therapeutic at 0.39 on 1,850 units/hr. CBC stable, no s/s of bleed.  Goal of Therapy:  Heparin level 0.3-0.7 units/ml Monitor platelets by anticoagulation protocol: Yes   Plan:  Continue heparin gtt at 1,850 units/hr Monitor daily HL, CBC, s/s of bleed  Enzo BiNathan Shaine Newmark, PharmD, BCPS Clinical Pharmacist Pager [phone] 12/24/2015 4:05 PM

## 2015-12-24 NOTE — Progress Notes (Signed)
Patient wheeled via stretcher in stable condition to 3W-06, VS and weight done and patient placed on the monitor, text paged the MD as per orders on patient's arrival and he came right up to see him. Patient was on a Heparin and NTG drip running withut difficulty and at this time patient denies pain. Patient and his wife showed controls and how to use them and the white board and how to call for help, explained to patient and his wife why he needs to call for assistance before getting OOB especialy since he was on a Major blood thinner and a Vasodilator and patient verbalized understanding. MD is talking with patient and will come back in a little bit.

## 2015-12-24 NOTE — Consult Note (Addendum)
[address]       [place] [id number]             [phone]        [facility]ari[facility] Conception[facility]ry [facility] #[id number] Date of Birth: 12/04/1951  Referring: No ref. provider found Bryan Lemmaavid Harding M.D.-  Primary Care: Judie PetitSWORDS,BRUCE HENRY, MD  Chief Complaint:    Chief Complaint  Patient presents with  . Chest Pain   unstable angina with severe three-vessel coronary disease  History of Present Illness:      Patient examined, coronary angiogram and 2-D echocardiogram personally reviewed and counseled with patient. Very nice 65 year old Caucasian male ex-smoker nondiabetic with history of PCI 17 years ago to the LAD presents with unstable angina and mildly elevated cardiac enzymes troponin 0.4. He is stabilized on IV heparin and IV nitroglycerin. Cardiac catheterization shows severe in-stent stenosis of the LAD with occlusion of the distal vessel, occlusion of the right coronary, and high-grade stenosis of the circumflex system. LVEF is preserved at 55%. LVEDP is 10 mmHg. Multivessel CABG has been recommended as his best long-term therapy.      Current Activity/ Functional Status: Patient lives his wife and works as a Music therapistcarpenter   Zubrod Score: At the time of surgery this patient's most appropriate activity status/level should be described as: []     0    Normal activity, no symptoms []     1    Restricted in physical strenuous activity but ambulatory, able to do out light work [x]     2    Ambulatory and capable of self care, unable to do work activities, up and about                 more than 50%  Of the time                            []     3    Only limited self care, in bed greater than 50% of waking hours []     4    Completely disabled, no self care, confined to bed or chair []     5    Moribund  Past Medical History  Diagnosis Date  . Kidney stones   . CAD (coronary artery disease)   . Hyperlipidemia   . Diabetes mellitus without complication (HCC)    type 2   . NSTEMI (non-ST elevated myocardial infarction) [facility](HCC)     Past Surgical History  Procedure Laterality Date  . Angioplasty    . Cardiac catheterization N/A 12/23/2015    Procedure: Left Heart Cath and Coronary Angiography;  Surgeon: Marykay Lexavid W Harding, MD;  [facility];  Service: Cardiovascular;  Laterality: N/A;  any available cath attending    History  Smoking status  . Former Smoker  . Quit date: 12/21/2007  Smokeless tobacco  . Former NeurosurgeonUser  . Types: Chew  . Quit date: 05/23/2015    History  Alcohol Use No    Social History   Social History  . Marital Status: Single    Spouse Name: N/A  . Number of Children: N/A  . Years of Education: N/A   Occupational History  . Not on file.   Social History Main Topics  . Smoking status: Former Smoker    Quit date: 12/21/2007  . Smokeless tobacco: Former NeurosurgeonUser    Types: Chew    Quit date: 05/23/2015  .  Alcohol Use: No  . Drug Use: No  . Sexual Activity: Not on file   Other Topics Concern  . Not on file   Social History Narrative    No Known Allergies  Current Facility-Administered Medications  Medication Dose Route Frequency Provider Last Rate Last Dose  . 0.9 %  sodium chloride infusion  250 mL Intravenous PRN Leeann Must, MD 10 mL/hr at 12/23/15 1613 250 mL at 12/23/15 1613  . 0.9 %  sodium chloride infusion  250 mL Intravenous PRN Marykay Lex, MD      . acetaminophen (TYLENOL) tablet 650 mg  650 mg Oral Q4H PRN Leeann Must, MD      . aspirin EC tablet 81 mg  81 mg Oral Daily Leeann Must, MD   81 mg at 12/24/15 0809  . atorvastatin (LIPITOR) tablet 80 mg  80 mg Oral q1800 Leeann Must, MD   80 mg at 12/23/15 1733  . heparin ADULT infusion 100 units/mL (25000 units/250 mL)  1,850 Units/hr Intravenous Continuous Candis Schatz Mancheril, RPH 18.5 mL/hr at 12/24/15 0021 1,850 Units/hr at 12/24/15 0021  . metoprolol tartrate (LOPRESSOR) tablet 12.5 mg  12.5 mg Oral BID Leeann Must, MD   12.5 mg at  12/24/15 0809  . morphine 2 MG/ML injection 2 mg  2 mg Intravenous Q1H PRN Marykay Lex, MD      . nitroGLYCERIN 50 mg in dextrose 5 % 250 mL (0.2 mg/mL) infusion  3-30 mcg/min Intravenous Titrated Leeann Must, MD 4.5 mL/hr at 12/23/15 1906 15 mcg/min at 12/23/15 1906  . ondansetron (ZOFRAN) injection 4 mg  4 mg Intravenous Q6H PRN Leeann Must, MD      . sodium chloride 0.9 % injection 3 mL  3 mL Intravenous Q12H Leeann Must, MD   3 mL at 12/21/15 1000  . sodium chloride 0.9 % injection 3 mL  3 mL Intravenous PRN Leeann Must, MD      . sodium chloride 0.9 % injection 3 mL  3 mL Intravenous Q12H Marykay Lex, MD   3 mL at 12/24/15 0811  . sodium chloride 0.9 % injection 3 mL  3 mL Intravenous PRN Marykay Lex, MD        Prescriptions prior to admission  Medication Sig Dispense Refill Last Dose  . aspirin 325 MG tablet Take 325 mg by mouth daily.   12/21/2015 at Unknown time  . nitroGLYCERIN (NITROSTAT) 0.4 MG SL tablet Place 1 tablet (0.4 mg total) under the tongue every 5 (five) minutes as needed for chest pain. 15 tablet 0 12/21/2015 at Unknown time  . ibuprofen (ADVIL,MOTRIN) 800 MG tablet Take 1 tablet (800 mg total) by mouth 3 (three) times daily. (Patient not taking: Reported on 12/21/2015) 21 tablet 0 Not Taking at Unknown time    Family History  Problem Relation Age of Onset  . Heart disease Maternal Aunt      Review of Systems:       Cardiac Review of Systems: Y or N  Chest Pain [  yes  ]  Resting SOB [  no ] Exertional SOB  [ yes ]  Orthopnea [no ]   Pedal Edema [  no ]    Palpitations [no  ] Syncope  [ no ]   Presyncope [no   ]  General Review of Systems: [Y] = yes [  ]=no Constitional: recent weight change [  ]; anorexia [  ]; fatigue [  ]; nausea [  ]; night sweats [  ];  fever [  ]; or chills [  ]                                                               Dental: poor dentition[  ]; Last Dentist visit: Every 6 months  Eye : blurred vision [  ]; diplopia [   ]; vision  changes [  ];  Amaurosis fugax[  ]; Resp: cough [  ];  wheezing[  ];  hemoptysis[  ]; shortness of breath[  ]; paroxysmal nocturnal dyspnea[  ]; dyspnea on exertion[ yes ]; or orthopnea[  ];  GI:  gallstones[  ], vomiting[  ];  dysphagia[  ]; melena[  ];  hematochezia [  ]; heartburn[  ];   Hx of  Colonoscopy[  ]; GU: kidney stones [  ]; hematuria[  ];   dysuria [  ];  nocturia[  ];  history of     obstruction [  ]; urinary frequency [  ]             Skin: rash, swelling[  ];, hair loss[  ];  peripheral edema[  ];  or itching[  ]; Musculosketetal: myalgias[ yes ];  joint swelling[  ];  joint erythema[  ]; chronic arthritis of right shoulder  joint pain[ yes ];  back pain[  ];  Heme/Lymph: bruising[  ];  bleeding[  ];  anemia[  ];  Neuro: TIA[  ];  headaches[  ];  stroke[  ];  vertigo[  ];  seizures[  ];   paresthesias[  ];  difficulty walking[  ];  Psych:depression[  ]; anxiety[  ];  Endocrine: diabetes[  ];  thyroid dysfunction[  ];  Immunizations: Flu [  ]; Pneumococcal[  ];  Other: Right-hand dominant                          No history of thoracic trauma, pneumothorax  Physical Exam: BP 113/54 mmHg  Pulse 57  Temp(Src) 97.6 F (36.4 C) (Oral)  Resp 15  Ht 5\' 9"  (1.753 m)  Wt 201 lb 3.2 oz (91.264 kg)  BMI 29.70 kg/m2  SpO2 92%       Physical Exam  General: Pleasant middle-aged Caucasian male no acute distress, well-nourished HEENT: Normocephalic pupils equal , dentition adequate Neck: Supple without JVD, adenopathy, or bruit Chest: Clear to auscultation, symmetrical breath sounds, no rhonchi, no tenderness             or deformity Cardiovascular: Regular rate and rhythm, no murmur, no gallop, peripheral pulses             palpable in all extremities Abdomen:  Soft, nontender, no palpable mass or organomegaly Extremities: Warm, well-perfused, no clubbing cyanosis edema or tenderness,              no venous stasis changes of the legs Rectal/GU: Deferred Neuro: Grossly  non--focal and symmetrical throughout Skin: Clean and dry without rash or ulceration   Diagnostic Studies & Laboratory data:     Recent Radiology Findings:   No results found.   I have independently reviewed the above radiologic studies.  Recent Lab Findings: Lab Results  Component Value Date   WBC 6.1 12/24/2015   HGB 15.2 12/24/2015  HCT 44.1 12/24/2015   PLT 183 12/24/2015   GLUCOSE 145* 12/23/2015   CHOL 194 12/22/2015   TRIG 470* 12/22/2015   HDL 30* 12/22/2015   LDLDIRECT 81.9 11/16/2013   LDLCALC UNABLE TO CALCULATE IF TRIGLYCERIDE OVER 400 mg/dL 40/98/1191   ALT 29 47/82/9562   AST 22 12/21/2015   NA 137 12/23/2015   K 3.8 12/23/2015   CL 104 12/23/2015   CREATININE 1.01 12/23/2015   BUN 11 12/23/2015   CO2 25 12/23/2015   TSH 3.582 12/21/2015   INR 1.09 12/21/2015   HGBA1C 6.7* 12/21/2015      Assessment / Plan:     I've discussed multivessel CABG with the patient and his wife. They understand that this would provide his best long-term therapy for severe multivessel CAD.  Surgery will be scheduled for Monday am , January 9. I've discussed the procedure of CABG with the patient is wife and they agree with this plan.      @ME1 @ 12/24/2015 9:45 AM

## 2015-12-24 NOTE — Progress Notes (Signed)
ANTICOAGULATION CONSULT NOTE- Follow up  Pharmacy Consult for Heparin  Indication: chest pain/ACS  No Known Allergies  Patient Measurements: Height: 5\' 9"  (175.3 cm) Weight: 201 lb 3.2 oz (91.264 kg) IBW/kg (Calculated) : 70.7  Vital Signs: Temp: 97.6 F (36.4 C) (01/04 0750) Temp Source: Oral (01/04 0750) BP: 113/54 mmHg (01/04 0809) Pulse Rate: 57 (01/04 0809)   Labs:  Recent Labs  12/21/15 1256 12/21/15 1800  12/22/15 0329  12/23/15 0357 12/23/15 2324 12/24/15 0301 12/24/15 0856  HGB  --   --   < > 16.0  --  16.1  --  15.2  --   HCT  --   --   --  46.2  --  46.4  --  44.1  --   PLT  --   --   --  187  --  183  --  183  --   HEPARINUNFRC <0.10*  --   < > 0.33  < > 0.40 0.20*  --  0.44  CREATININE  --   --   --  0.93  --  1.01  --   --   --   TROPONINI 0.22* 0.36*  --   --   --   --   --   --   --   < > = values in this interval not displayed. Estimated Creatinine Clearance: 83.5 mL/min (by C-G formula based on Cr of 1.01).  Medical History: Past Medical History  Diagnosis Date  . Kidney stones   . CAD (coronary artery disease)   . Hyperlipidemia   . Diabetes mellitus without complication (HCC)     type 2   . NSTEMI (non-ST elevated myocardial infarction) [facility](HCC)    Assessment: 65 y/o s/p cardiac cath on 12/23/15.  Prior to cath, the heparin level was 0.40, therapeutic on IV heparin drip at 1700 units/hr per pharmacy for CP/NSTEMI. CBC stable.  Now s/p cardiac cath-> severe 3 vessel CAD per cath lab report. BMS > 2 yr old w/severe restenosis.  HL is therapeutic at 0.44 on 1850 un/hr Plan is for CABG on Monday 1/9  Goal of Therapy:  Heparin level 0.3-0.7 units/ml Monitor platelets by anticoagulation protocol: Yes   Plan:  Continue heparin to 1850 units / hr. Check f/u HL in 6 hours @ 1500 Daily CBC/HL  Ivon Roedel C Lorenza Shakir,PharmD. Clinical Pharmacist

## 2015-12-24 NOTE — Progress Notes (Signed)
CARDIAC REHAB PHASE I   PRE:  Rate/Rhythm: 58 SB  BP:  Sitting: 114/68        SaO2: 96 RA  MODE:  Ambulation: 550 ft   POST:  Rate/Rhythm: 69 SR  BP:  Sitting: 123/66         SaO2: 98 RA  Pt ambulated 550 ft on RA, IV, standby assist, tolerated well. Pt denies CP, dizziness, DOE, declined rest stop. Completed cardiac surgery pre-op education with pt. Reviewed sternal precautions, IS, activity progression, cardiac surgery booklet and cardiac surgery guidelines. Gave pt information to view cardiac surgery videos. Pt verbalized understanding. Will review information with pt wife when present to answer any questions, per pt, wife will be present tomorrow. Pt to recliner after walk, call bell within reach. Will follow.   [phone] Joylene GrapesMonge, Myangel Summons C, RN, BSN 12/24/2015 11:44 AM

## 2015-12-24 NOTE — Progress Notes (Signed)
Patient Profile: [age]M with CAD s/p PCI, hyperlipidemia, and diabetes mellitus type 2 admitted for CP and ruled in for NSTEMI. LHC showed severe multivessel disease. Now awaiting CABG.   Subjective: No complaints. Denies any CP or dyspnea overnight. Right radial cath site is ok. Wife is by bedside.   Objective: Vital signs in last 24 hours: Temp:  [97.4 F (36.3 C)-98.5 F (36.9 C)] 97.7 F (36.5 C) (01/04 0553) Pulse Rate:  [0-74] 57 (01/04 0553) Resp:  [0-21] 17 (01/04 0553) BP: (97-139)/(52-84) 114/52 mmHg (01/04 0553) SpO2:  [0 %-98 %] 92 % (01/04 0553) Weight:  [201 lb 3.2 oz (91.264 kg)] 201 lb 3.2 oz (91.264 kg) (01/04 0553) Last BM Date: 12/23/15  Intake/Output from previous day: 01/03 0701 - 01/04 0700 In: 1616.5 [P.O.:900; I.V.:716.5] Out: 2375 [Urine:2375] Intake/Output this shift:    Medications Current Facility-Administered Medications  Medication Dose Route Frequency Provider Last Rate Last Dose  . 0.9 %  sodium chloride infusion  250 mL Intravenous PRN Leeann MustJacob Kelly, MD 10 mL/hr at 12/23/15 1613 250 mL at 12/23/15 1613  . 0.9 %  sodium chloride infusion  250 mL Intravenous PRN Marykay Lexavid W Harding, MD      . acetaminophen (TYLENOL) tablet 650 mg  650 mg Oral Q4H PRN Leeann MustJacob Kelly, MD      . aspirin EC tablet 81 mg  81 mg Oral Daily Leeann MustJacob Kelly, MD   81 mg at 12/23/15 0524  . atorvastatin (LIPITOR) tablet 80 mg  80 mg Oral q1800 Leeann MustJacob Kelly, MD   80 mg at 12/23/15 1733  . heparin ADULT infusion 100 units/mL (25000 units/250 mL)  1,850 Units/hr Intravenous Continuous Candis SchatzBenjamin G Mancheril, RPH 18.5 mL/hr at 12/24/15 0021 1,850 Units/hr at 12/24/15 0021  . metoprolol tartrate (LOPRESSOR) tablet 12.5 mg  12.5 mg Oral BID Leeann MustJacob Kelly, MD   12.5 mg at 12/23/15 2136  . morphine 2 MG/ML injection 2 mg  2 mg Intravenous Q1H PRN Marykay Lexavid W Harding, MD      . nitroGLYCERIN 50 mg in dextrose 5 % 250 mL (0.2 mg/mL) infusion  3-30 mcg/min Intravenous Titrated Leeann MustJacob Kelly, MD 4.5 mL/hr at  12/23/15 1906 15 mcg/min at 12/23/15 1906  . ondansetron (ZOFRAN) injection 4 mg  4 mg Intravenous Q6H PRN Leeann MustJacob Kelly, MD      . sodium chloride 0.9 % injection 3 mL  3 mL Intravenous Q12H Leeann MustJacob Kelly, MD   3 mL at 12/21/15 1000  . sodium chloride 0.9 % injection 3 mL  3 mL Intravenous PRN Leeann MustJacob Kelly, MD      . sodium chloride 0.9 % injection 3 mL  3 mL Intravenous Q12H Marykay Lexavid W Harding, MD   3 mL at 12/23/15 2200  . sodium chloride 0.9 % injection 3 mL  3 mL Intravenous PRN Marykay Lexavid W Harding, MD        PE: General appearance: alert, cooperative and no distress Neck: no carotid bruit and no JVD Lungs: clear to auscultation bilaterally Heart: regular rate and rhythm, S1, S2 normal, no murmur, click, rub or gallop Extremities: no LEE Pulses: 2+ and symmetric Skin: warm and dry Neurologic: Grossly normal  Lab Results:   Recent Labs  12/22/15 0329 12/23/15 0357 12/24/15 0301  WBC 7.9 6.5 6.1  HGB 16.0 16.1 15.2  HCT 46.2 46.4 44.1  PLT 187 183 183   BMET  Recent Labs  12/22/15 0329 12/23/15 0357  NA 139 137  K 4.0 3.8  CL 106 104  CO2 23 25  GLUCOSE 134* 145*  BUN 14 11  CREATININE 0.93 1.01  CALCIUM 8.9 8.8*   PT/INR No results for input(s): LABPROT, INR in the last 72 hours. Cholesterol  Recent Labs  12/22/15 0329  CHOL 194   Cardiac Panel (last 3 results)  Recent Labs  12/21/15 1256 12/21/15 1800  TROPONINI 0.22* 0.36*    Studies/Results: Procedures    Left Heart Cath and Coronary Angiography    Conclusion    1. Ost RCA to Dist RCA lesion, 100% stenosed. Dist RCA lesion, 99% stenosed. Ost RPDA lesion, 100% stenosed. Post Atrio lesion, 90% stenosed. 2. Prox LAD to Mid LAD lesion, 95% stenosed. The lesion was previously treated with a bare metal stent greater than two years ago. Severe IN-Stent Restenosis 3. Dist LAD lesion, 100% stenosed beond D2. 2nd Diag lesion, 70% stenosed. 4. 2nd Mrg lesion, 65% stenosed. 5. The left ventricular systolic  function is normal.   Severe multivessel disease with the best option being bypass surgery. The LAD has extensive stent with diffuse in-stent restenosis and < TIMI 1 flow distally. There are some left to right collaterals filling the distal LAD which then fills the PDA with collaterals. The RCA is totally occluded with minimal retrograde filling beyond the bifurcation. There is extensive posterolateral branch that has collaterals, but no retrograde filling of the PDA.    Assessment/Plan  Principal Problem:   Unstable angina (HCC) Active Problems:   Dyslipidemia   Type 2 diabetes mellitus with vascular disease (HCC)   CAD S/P percutaneous coronary angioplasty   CAD (coronary artery disease)   1. NSTEMI/CAD: severe multivessel disease. Plan is for likely surgical revascularization. CT surgery consult pending. Continue medical therapy for now. He is stable and CP free. VSS. Mild sinus brady on telemetry with resting HR in the mid 50s.   2. DLD: Poorly controlled. TG 470. LDL could not be calculated. Will order a direct LDL. Continue high intensity statin.   3. T2DM: controlled Hgb A1c ia 6.7.    LOS: 3 days    Haide Klinker M. Sharol Harness, PA-C 12/24/2015 7:06 AM

## 2015-12-24 NOTE — Progress Notes (Signed)
[facility]'s room and explained brief history, meds, VS and patient's general condition. Patient was weighed, lungs clear on Room air and HR was in the high 50's with B/P WNL, will keep on NTG drip at 10 mcgs/min for now but patient is denying pain at this time, will transfer care to DAwn in stable condition.

## 2015-12-25 LAB — CBC
HCT: 45.4 % (ref 39.0–52.0)
Hemoglobin: 14.9 g/dL (ref 13.0–17.0)
MCH: 29.3 pg (ref 26.0–34.0)
MCHC: 32.8 g/dL (ref 30.0–36.0)
MCV: 89.2 fL (ref 78.0–100.0)
Platelets: 183 K/uL (ref 150–400)
RBC: 5.09 MIL/uL (ref 4.22–5.81)
RDW: 13 % (ref 11.5–15.5)
WBC: 6 K/uL (ref 4.0–10.5)

## 2015-12-25 LAB — GLUCOSE, CAPILLARY
GLUCOSE-CAPILLARY: 123 mg/dL — AB (ref 65–99)
GLUCOSE-CAPILLARY: 87 mg/dL (ref 65–99)
GLUCOSE-CAPILLARY: 128 mg/dL — AB (ref 65–99)
Glucose-Capillary: 104 mg/dL — ABNORMAL HIGH (ref 65–99)

## 2015-12-25 LAB — HEPARIN LEVEL (UNFRACTIONATED): Heparin Unfractionated: 0.54 IU/mL (ref 0.30–0.70)

## 2015-12-25 LAB — LDL CHOLESTEROL, DIRECT: Direct LDL: 82 mg/dL (ref 0–99)

## 2015-12-25 NOTE — Progress Notes (Signed)
Patient complaining of mid sternal chest pressure 2/10. Vital signs stable BP 148/77. EKG performed, oxygen initiated 2L nasal cannula, and 2 mg Morphine given. Cardiology fellow paged. No new orders. Will continue to monitor.

## 2015-12-25 NOTE — Progress Notes (Signed)
Patient Profile: 73M with CAD s/p PCI, hyperlipidemia, and diabetes mellitus type 2 admitted for CP and ruled in for NSTEMI. LHC showed severe multivessel disease. Now awaiting CABG next Monday.   Subjective: No complaints. Did have some chest discomfort last night - 2/10, given morphine with relief.  Objective: Vital signs in last 24 hours: Temp:  [97.6 F (36.4 C)-98.8 F (37.1 C)] 97.6 F (36.4 C) (01/05 0743) Pulse Rate:  [51-58] 55 (01/05 0743) Resp:  [15-19] 18 (01/05 0642) BP: (104-148)/(64-77) 121/72 mmHg (01/05 0743) SpO2:  [93 %-99 %] 93 % (01/05 0743) Weight:  [200 lb 3.2 oz (90.81 kg)] 200 lb 3.2 oz (90.81 kg) (01/05 0642) Last BM Date: 12/24/15  Intake/Output from previous day: 01/04 0701 - 01/05 0700 In: 1873.9 [P.O.:1040; I.V.:833.9] Out: 1975 [Urine:1975] Intake/Output this shift: Total I/O In: -  Out: 300 [Urine:300]  Medications Current Facility-Administered Medications  Medication Dose Route Frequency Provider Last Rate Last Dose  . 0.9 %  sodium chloride infusion  250 mL Intravenous PRN Leeann Must, MD 10 mL/hr at 12/23/15 1613 250 mL at 12/23/15 1613  . 0.9 %  sodium chloride infusion  250 mL Intravenous PRN Marykay Lex, MD      . acetaminophen (TYLENOL) tablet 650 mg  650 mg Oral Q4H PRN Leeann Must, MD      . aspirin EC tablet 81 mg  81 mg Oral Daily Leeann Must, MD   81 mg at 12/25/15 1610  . atorvastatin (LIPITOR) tablet 80 mg  80 mg Oral q1800 Leeann Must, MD   80 mg at 12/24/15 1728  . heparin ADULT infusion 100 units/mL (25000 units/250 mL)  1,850 Units/hr Intravenous Continuous Candis Schatz Mancheril, RPH 18.5 mL/hr at 12/24/15 2209 1,850 Units/hr at 12/24/15 2209  . insulin aspart (novoLOG) injection 0-15 Units  0-15 Units Subcutaneous TID WC [name], MD   2 Units at 12/25/15 [phone]  . insulin aspart (novoLOG) injection 0-5 Units  0-5 Units Subcutaneous QHS [name], MD   0 Units at 12/24/15 2209  . isosorbide mononitrate  (IMDUR) 24 hr tablet 30 mg  30 mg Oral Daily Chrystie Nose, MD   30 mg at 12/25/15 0820  . metoprolol tartrate (LOPRESSOR) tablet 12.5 mg  12.5 mg Oral BID Leeann Must, MD   12.5 mg at 12/25/15 0820  . morphine 2 MG/ML injection 2 mg  2 mg Intravenous Q1H PRN Marykay Lex, MD   2 mg at 12/25/15 0231  . ondansetron (ZOFRAN) injection 4 mg  4 mg Intravenous Q6H PRN Leeann Must, MD      . sodium chloride 0.9 % injection 3 mL  3 mL Intravenous Q12H Leeann Must, MD   3 mL at 12/25/15 5409  . sodium chloride 0.9 % injection 3 mL  3 mL Intravenous PRN Leeann Must, MD      . sodium chloride 0.9 % injection 3 mL  3 mL Intravenous Q12H Marykay Lex, MD   3 mL at 12/25/15 8119  . sodium chloride 0.9 % injection 3 mL  3 mL Intravenous PRN Marykay Lex, MD        PE: General appearance: alert, cooperative and no distress Neck: no carotid bruit and no JVD Lungs: clear to auscultation bilaterally Heart: regular rate and rhythm, S1, S2 normal, no murmur, click, rub or gallop Extremities: no LEE Pulses: 2+ and symmetric Skin: warm and dry Neurologic: Grossly normal  Lab Results:   Recent Labs  12/23/15 0357 12/24/15 0301 12/25/15 0537  WBC 6.5 6.1 6.0  HGB 16.1 15.2 14.9  HCT 46.4 44.1 45.4  PLT 183 183 183   BMET  Recent Labs  12/23/15 0357  NA 137  K 3.8  CL 104  CO2 25  GLUCOSE 145*  BUN 11  CREATININE 1.01  CALCIUM 8.8*   PT/INR No results for input(s): LABPROT, INR in the last 72 hours. Cholesterol No results for input(s): CHOL in the last 72 hours. Cardiac Panel (last 3 results) No results for input(s): CKTOTAL, CKMB, TROPONINI, RELINDX in the last 72 hours.  Studies/Results: Procedures    Left Heart Cath and Coronary Angiography    Conclusion    1. Ost RCA to Dist RCA lesion, 100% stenosed. Dist RCA lesion, 99% stenosed. Ost RPDA lesion, 100% stenosed. Post Atrio lesion, 90% stenosed. 2. Prox LAD to Mid LAD lesion, 95% stenosed. The lesion was previously  treated with a bare metal stent greater than two years ago. Severe IN-Stent Restenosis 3. Dist LAD lesion, 100% stenosed beond D2. 2nd Diag lesion, 70% stenosed. 4. 2nd Mrg lesion, 65% stenosed. 5. The left ventricular systolic function is normal.   Severe multivessel disease with the best option being bypass surgery. The LAD has extensive stent with diffuse in-stent restenosis and < TIMI 1 flow distally. There are some left to right collaterals filling the distal LAD which then fills the PDA with collaterals. The RCA is totally occluded with minimal retrograde filling beyond the bifurcation. There is extensive posterolateral branch that has collaterals, but no retrograde filling of the PDA.    Assessment/Plan  Principal Problem:   Unstable angina (HCC) Active Problems:   Dyslipidemia   Type 2 diabetes mellitus with vascular disease (HCC)   CAD S/P percutaneous coronary angioplasty   CAD (coronary artery disease)   1. NSTEMI/CAD: severe multivessel disease. Plan is for surgical revascularization on Monday. Continue medical therapy for now. He is stable and CP free on IV heparin and po imdur. VSS. Mild sinus brady on telemetry with resting HR in the mid 50s.   2. HLD: Poorly controlled. TG 470. LDL 82. On Lipitor 80 mg.  3. T2DM: controlled Hgb A1c ia 6.7.   Chrystie NoseKenneth C. Quincey Nored, MD, [facility] Center BarbourFACC Attending Cardiologist [facility]   LOS: 4 days  12/25/2015 11:59 AM

## 2015-12-25 NOTE — Progress Notes (Signed)
UR Completed Clemencia Helzer Graves-Bigelow, RN,BSN [phone]  

## 2015-12-25 NOTE — Progress Notes (Signed)
ANTICOAGULATION CONSULT NOTE - Follow Up Consult  Pharmacy Consult for heparin Indication: chest pain/ACS  No Known Allergies  Patient Measurements: Height: 5\' 9"  (175.3 cm) Weight: 200 lb 3.2 oz (90.81 kg) IBW/kg (Calculated) : 70.7  Vital Signs: Temp: 97.6 F (36.4 C) (01/05 0743) Temp Source: Oral (01/05 0743) BP: 121/72 mmHg (01/05 0743) Pulse Rate: 55 (01/05 0743)  Labs:  Recent Labs  12/23/15 0357  12/24/15 0301 12/24/15 0856 12/24/15 1456 12/25/15 0537  HGB 16.1  --  15.2  --   --  14.9  HCT 46.4  --  44.1  --   --  45.4  PLT 183  --  183  --   --  183  HEPARINUNFRC 0.40  < >  --  0.44 0.39 0.54  CREATININE 1.01  --   --   --   --   --   < > = values in this interval not displayed.  Estimated Creatinine Clearance: 83.3 mL/min (by C-G formula based on Cr of 1.01).  Assessment: 65 y/o s/p cardiac cath on 12/23/15.Continuing on heparin. Confirmatory HL was therapeutic at 0.54 on 1,850 units/hr. CBC stable, no s/s of bleed. CABG planned for Monday  Goal of Therapy:  Heparin level 0.3-0.7 units/ml Monitor platelets by anticoagulation protocol: Yes   Plan:  Continue heparin gtt at 1,850 units/hr Monitor daily HL, CBC, s/s of bleed  Signe Coltonya C Shanetra Blumenstock, PharmD Clinical Pharmacist 12/25/2015 11:36 AM

## 2015-12-25 NOTE — Progress Notes (Signed)
CARDIAC REHAB PHASE I   PRE:  Rate/Rhythm: 64 SR  BP:  Supine:   Sitting: 117/77  Standing:    SaO2: 97%RA  MODE:  Ambulation: 550 ft   POST:  Rate/Rhythm: 61 SR  BP:  Supine:   Sitting: 133/68  Standing:    SaO2: 98%RA 1055-1130 Noted that pt had CP last night. Pt stated not having pain now, and that surgeon had told him he wants him to walk if he can. Pt wanted to walk so we walked 550 ft with steady gait. Tolerated well. No CP. To recliner after walk. Pt's wife in room and reviewed importance of walking and IS after surgery with her. She will be available to stay with pt after discharge. They have watched pre op video. Pt can get 2500 ml on IS.   Adrian Nuttingharlene Tavion Senkbeil, RN BSN  12/25/2015 11:32 AM

## 2015-12-25 NOTE — Progress Notes (Signed)
Pt c/o chest pressure, denies SOB, morphine prn given. wil continue to monitor closely.  Raymon MuttonGwen Salina Stanfield RN

## 2015-12-26 LAB — BASIC METABOLIC PANEL
Anion gap: 9 (ref 5–15)
BUN: 13 mg/dL (ref 6–20)
CALCIUM: 9.3 mg/dL (ref 8.9–10.3)
CHLORIDE: 106 mmol/L (ref 101–111)
CO2: 24 mmol/L (ref 22–32)
CREATININE: 0.98 mg/dL (ref 0.61–1.24)
GFR calc non Af Amer: >60 mL/min (ref >60)
GLUCOSE: 134 mg/dL — AB (ref 65–99)
Potassium: 3.9 mmol/L (ref 3.5–5.1)
Sodium: 139 mmol/L (ref 135–145)

## 2015-12-26 LAB — CBC
HEMATOCRIT: 47.5 % (ref 39.0–52.0)
Hemoglobin: 16.3 g/dL (ref 13.0–17.0)
MCH: 30.5 pg (ref 26.0–34.0)
MCHC: 34.3 g/dL (ref 30.0–36.0)
MCV: 89 fL (ref 78.0–100.0)
PLATELETS: 201 10*3/uL (ref 150–400)
RBC: 5.34 MIL/uL (ref 4.22–5.81)
RDW: 13 % (ref 11.5–15.5)
WBC: 6.8 10*3/uL (ref 4.0–10.5)

## 2015-12-26 LAB — GLUCOSE, CAPILLARY
GLUCOSE-CAPILLARY: 110 mg/dL — AB (ref 65–99)
Glucose-Capillary: 140 mg/dL — ABNORMAL HIGH (ref 65–99)
Glucose-Capillary: 125 mg/dL — ABNORMAL HIGH (ref 65–99)
Glucose-Capillary: 124 mg/dL — ABNORMAL HIGH (ref 65–99)

## 2015-12-26 LAB — HEMOGLOBIN A1C
Hgb A1c MFr Bld: 6.5 % — ABNORMAL HIGH (ref 4.8–5.6)
Mean Plasma Glucose: 140 mg/dL

## 2015-12-26 LAB — HEPARIN LEVEL (UNFRACTIONATED): Heparin Unfractionated: 0.51 IU/mL (ref 0.30–0.70)

## 2015-12-26 NOTE — Progress Notes (Signed)
ANTICOAGULATION CONSULT NOTE - Follow Up Consult  Pharmacy Consult for heparin Indication: chest pain/ACS  No Known Allergies  Patient Measurements: Height: 5\' 9"  (175.3 cm) Weight: 198 lb 4.8 oz (89.948 kg) IBW/kg (Calculated) : 70.7  Vital Signs: Temp: 98.3 F (36.8 C) (01/06 0733) Temp Source: Oral (01/06 0733) BP: 124/56 mmHg (01/06 0733) Pulse Rate: 51 (01/06 0733)  Labs:  Recent Labs  12/24/15 0301  12/24/15 1456 12/25/15 0537 12/26/15 0540  HGB 15.2  --   --  14.9 16.3  HCT 44.1  --   --  45.4 47.5  PLT 183  --   --  183 201  HEPARINUNFRC  --   < > 0.39 0.54 0.51  CREATININE  --   --   --   --  0.98  < > = values in this interval not displayed.  Estimated Creatinine Clearance: 85.6 mL/min (by C-G formula based on Cr of 0.98).   . heparin 1,850 Units/hr (12/26/15 0406)     Assessment: 65 y/o s/p cardiac cath on 12/23/15.Continuing on heparin, heparin level remains at goal on heparin at 1850 units/hr. CBC stable, no s/s of bleed. CABG planned for Monday  Goal of Therapy:  Heparin level 0.3-0.7 units/ml Monitor platelets by anticoagulation protocol: Yes   Plan:  Continue heparin gtt at 1,850 units/hr Monitor daily HL, CBC, s/s of bleed CABG planned Monday 1/9  Tad MooreJessica Houa Ackert, Pharm D, BCPS  Clinical Pharmacist Pager [phone]  12/26/2015 8:58 AM

## 2015-12-26 NOTE — Progress Notes (Signed)
Patient Profile: 29M with CAD s/p PCI, hyperlipidemia, and diabetes mellitus type 2 admitted for CP and ruled in for NSTEMI. LHC showed severe multivessel disease. Now awaiting CABG next Monday.   Subjective: No complaints at present. More chest discomfort last night - 2/10, given morphine with relief.   Objective: Vital signs in last 24 hours: Temp:  [97.4 F (36.3 C)-98.4 F (36.9 C)] 98.3 F (36.8 C) (01/06 0733) Pulse Rate:  [49-57] 51 (01/06 0733) Resp:  [20] 20 (01/05 1810) BP: (106-130)/(56-68) 124/56 mmHg (01/06 0733) SpO2:  [92 %-96 %] 93 % (01/06 0733) Weight:  [198 lb 4.8 oz (89.948 kg)] 198 lb 4.8 oz (89.948 kg) (01/06 0500) Last BM Date: 12/24/15  Intake/Output from previous day: 01/05 0701 - 01/06 0700 In: 1138.4 [P.O.:720; I.V.:418.4] Out: 1225 [Urine:1225] Intake/Output this shift:    Medications Current Facility-Administered Medications  Medication Dose Route Frequency Provider Last Rate Last Dose  . 0.9 %  sodium chloride infusion  250 mL Intravenous PRN Leeann Must, MD 10 mL/hr at 12/23/15 1613 250 mL at 12/23/15 1613  . 0.9 %  sodium chloride infusion  250 mL Intravenous PRN Marykay Lex, MD      . acetaminophen (TYLENOL) tablet 650 mg  650 mg Oral Q4H PRN Leeann Must, MD      . aspirin EC tablet 81 mg  81 mg Oral Daily Leeann Must, MD   81 mg at 12/25/15 1610  . atorvastatin (LIPITOR) tablet 80 mg  80 mg Oral q1800 Leeann Must, MD   80 mg at 12/25/15 1819  . heparin ADULT infusion 100 units/mL (25000 units/250 mL)  1,850 Units/hr Intravenous Continuous Candis Schatz Mancheril, RPH 18.5 mL/hr at 12/26/15 0406 1,850 Units/hr at 12/26/15 0406  . insulin aspart (novoLOG) injection 0-15 Units  0-15 Units Subcutaneous TID WC [name], MD   2 Units at 12/25/15 [id number]  . insulin aspart (novoLOG) injection 0-5 Units  0-5 Units Subcutaneous QHS [name], MD   0 Units at 12/24/15 2209  . isosorbide mononitrate (IMDUR) 24 hr tablet 30 mg  30 mg Oral  Daily Chrystie Nose, MD   30 mg at 12/25/15 0820  . metoprolol tartrate (LOPRESSOR) tablet 12.5 mg  12.5 mg Oral BID Leeann Must, MD   12.5 mg at 12/25/15 0820  . morphine 2 MG/ML injection 2 mg  2 mg Intravenous Q1H PRN Marykay Lex, MD   2 mg at 12/25/15 1921  . ondansetron (ZOFRAN) injection 4 mg  4 mg Intravenous Q6H PRN Leeann Must, MD      . sodium chloride 0.9 % injection 3 mL  3 mL Intravenous Q12H Leeann Must, MD   3 mL at 12/25/15 5409  . sodium chloride 0.9 % injection 3 mL  3 mL Intravenous PRN Leeann Must, MD      . sodium chloride 0.9 % injection 3 mL  3 mL Intravenous Q12H Marykay Lex, MD   3 mL at 12/25/15 8119  . sodium chloride 0.9 % injection 3 mL  3 mL Intravenous PRN Marykay Lex, MD        PE: General appearance: alert, cooperative and no distress Neck: no carotid bruit and no JVD Lungs: clear to auscultation bilaterally Heart: regular rate and rhythm, S1, S2 normal, no murmur, click, rub or gallop Extremities: no LEE Pulses: 2+ and symmetric Skin: warm and dry Neurologic: Grossly normal  Lab Results:   Recent Labs  12/24/15 0301 12/25/15 0537 12/26/15  0540  WBC 6.1 6.0 6.8  HGB 15.2 14.9 16.3  HCT 44.1 45.4 47.5  PLT 183 183 201   BMET  Recent Labs  12/26/15 0540  NA 139  K 3.9  CL 106  CO2 24  GLUCOSE 134*  BUN 13  CREATININE 0.98  CALCIUM 9.3   PT/INR No results for input(s): LABPROT, INR in the last 72 hours. Cholesterol No results for input(s): CHOL in the last 72 hours. Cardiac Panel (last 3 results) No results for input(s): CKTOTAL, CKMB, TROPONINI, RELINDX in the last 72 hours.  Studies/Results: Procedures    Left Heart Cath and Coronary Angiography    Conclusion    1. Ost RCA to Dist RCA lesion, 100% stenosed. Dist RCA lesion, 99% stenosed. Ost RPDA lesion, 100% stenosed. Post Atrio lesion, 90% stenosed. 2. Prox LAD to Mid LAD lesion, 95% stenosed. The lesion was previously treated with a bare metal stent greater  than two years ago. Severe IN-Stent Restenosis 3. Dist LAD lesion, 100% stenosed beond D2. 2nd Diag lesion, 70% stenosed. 4. 2nd Mrg lesion, 65% stenosed. 5. The left ventricular systolic function is normal.   Severe multivessel disease with the best option being bypass surgery. The LAD has extensive stent with diffuse in-stent restenosis and < TIMI 1 flow distally. There are some left to right collaterals filling the distal LAD which then fills the PDA with collaterals. The RCA is totally occluded with minimal retrograde filling beyond the bifurcation. There is extensive posterolateral branch that has collaterals, but no retrograde filling of the PDA.    Assessment/Plan  Principal Problem:   Unstable angina (HCC) Active Problems:   Dyslipidemia   Type 2 diabetes mellitus with vascular disease (HCC)   CAD S/P percutaneous coronary angioplasty   CAD (coronary artery disease)   1. NSTEMI/CAD: severe multivessel disease. Plan is for surgical revascularization on Monday. Continue medical therapy for now. He is stable. Intermittent nocturnal CP - mild intensity, relieved completely with morphine. On po imdur. VSS. Mild sinus brady on telemetry with resting HR in the mid 50s.   2. HLD: Poorly controlled. TG 470. LDL 82. On Lipitor 80 mg.  3. T2DM: controlled Hgb A1c ia 6.7.   Chrystie NoseKenneth C. Emin Foree, MD, [facility] HospitalFACC Attending Cardiologist [facility]   LOS: 5 days  12/26/2015 8:32 AM

## 2015-12-26 NOTE — Progress Notes (Signed)
CARDIAC REHAB PHASE I   PRE:  Rate/Rhythm: 58 SB  BP:  Supine:   Sitting: 133/97  Standing:    SaO2: 96%RA  MODE:  Ambulation: 550 ft   POST:  Rate/Rhythm: 59 SB  BP:  Supine:   Sitting: 128/77  Standing:    SaO2: 98%RA 1142-1206 Pt walked 550 ft with steady gait. No CP. Tolerated well. To sitting on side of bed after walk.   Luetta Nuttingharlene Dawnetta Copenhaver, RN BSN  12/26/2015 12:03 PM

## 2015-12-27 ENCOUNTER — Encounter (HOSPITAL_COMMUNITY): Payer: Self-pay | Admitting: Certified Registered Nurse Anesthetist

## 2015-12-27 ENCOUNTER — Inpatient Hospital Stay (HOSPITAL_COMMUNITY): Payer: Self-pay

## 2015-12-27 LAB — CBC
HEMATOCRIT: 47.3 % (ref 39.0–52.0)
HEMOGLOBIN: 16.4 g/dL (ref 13.0–17.0)
MCH: 30.7 pg (ref 26.0–34.0)
MCHC: 34.7 g/dL (ref 30.0–36.0)
MCV: 88.6 fL (ref 78.0–100.0)
PLATELETS: 203 10*3/uL (ref 150–400)
RBC: 5.34 MIL/uL (ref 4.22–5.81)
RDW: 12.9 % (ref 11.5–15.5)
WBC: 7.8 10*3/uL (ref 4.0–10.5)

## 2015-12-27 LAB — HEPARIN LEVEL (UNFRACTIONATED): Heparin Unfractionated: 0.62 IU/mL (ref 0.30–0.70)

## 2015-12-27 LAB — GLUCOSE, CAPILLARY
GLUCOSE-CAPILLARY: 156 mg/dL — AB (ref 65–99)
Glucose-Capillary: 178 mg/dL — ABNORMAL HIGH (ref 65–99)
Glucose-Capillary: 130 mg/dL — ABNORMAL HIGH (ref 65–99)
Glucose-Capillary: 128 mg/dL — ABNORMAL HIGH (ref 65–99)

## 2015-12-27 NOTE — Progress Notes (Signed)
ANTICOAGULATION CONSULT NOTE - Follow Up Consult  Pharmacy Consult for heparin Indication: chest pain/ACS  No Known Allergies  Patient Measurements: Height: 5\' 9"  (175.3 cm) Weight: 197 lb 6.4 oz (89.54 kg) IBW/kg (Calculated) : 70.7  Vital Signs: Temp: 98.1 F (36.7 C) (01/07 0535) Temp Source: Oral (01/07 0535) BP: 125/71 mmHg (01/07 0535) Pulse Rate: 58 (01/07 0535)  Labs:  Recent Labs  12/25/15 0537 12/26/15 0540 12/27/15 0419  HGB 14.9 16.3 16.4  HCT 45.4 47.5 47.3  PLT 183 201 203  HEPARINUNFRC 0.54 0.51 0.62  CREATININE  --  0.98  --     Estimated Creatinine Clearance: 85.3 mL/min (by C-G formula based on Cr of 0.98).   . heparin 1,850 Units/hr (12/26/15 2001)     Assessment: 65 y/o s/p cardiac cath on 12/23/15, found with severe multivessel CAD.  Continuing on heparin, heparin level remains at goal on heparin at 1850 units/hr. CBC stable, no s/s of bleed. CABG planned for Monday  Goal of Therapy:  Heparin level 0.3-0.7 units/ml Monitor platelets by anticoagulation protocol: Yes   Plan:  Continue heparin gtt at 1,850 units/hr Monitor daily HL, CBC, s/s of bleed CABG planned Monday 1/9  Tad MooreJessica Luccas Towell, Pharm D, BCPS  Clinical Pharmacist Pager [phone]  12/27/2015 7:59 AM

## 2015-12-27 NOTE — Progress Notes (Signed)
Subjective:  Severe three-vessel disease awaiting bypass surgery on Monday.  No recurrent chest pain overnight.  Sugars have been fluctuating.  Objective:  Vital Signs in the last 24 hours: BP 125/71 mmHg  Pulse 58  Temp(Src) 98.1 F (36.7 C) (Oral)  Resp 18  Ht 5\' 9"  (1.753 m)  Wt 89.54 kg (197 lb 6.4 oz)  BMI 29.14 kg/m2  SpO2 94%  Physical Exam: Obese male in no acute distress Lungs:  Clear  Cardiac:  Regular rhythm, normal S1 and S2, no S3 Extremities:  No edema present  Intake/Output from previous day: 01/06 0701 - 01/07 0700 In: 960 [P.O.:960] Out: 1725 [Urine:1725] Weight Filed Weights   12/25/15 0642 12/26/15 0500 12/27/15 0535  Weight: 90.81 kg (200 lb 3.2 oz) 89.948 kg (198 lb 4.8 oz) 89.54 kg (197 lb 6.4 oz)    Lab Results: Basic Metabolic Panel:  Recent Labs  16/09/9600/06/17 0540  NA 139  K 3.9  CL 106  CO2 24  GLUCOSE 134*  BUN 13  CREATININE 0.98    CBC:  Recent Labs  12/26/15 0540 12/27/15 0419  WBC 6.8 7.8  HGB 16.3 16.4  HCT 47.5 47.3  MCV 89.0 88.6  PLT 201 203    BNP    Component Value Date/Time   BNP 34.9 12/21/2015 0650    PROTIME: Lab Results  Component Value Date   INR 1.09 12/21/2015    Telemetry: Normal sinus rhythm with sinus bradycardia  Assessment/Plan:  1.  Severe three-vessel coronary artery disease awaiting bypass grafting with no angina 2.  Diabetes mellitus borderline control 3.  Hyperlipidemia under treatment  Recommendations:  Continue medical treatment and anticoagulation.  Bypass grafting planned on Monday.     Darden PalmerW. Spencer Tilley, Jr.  MD [facility] CenterFACC Cardiology  12/27/2015, 8:45 AM

## 2015-12-28 ENCOUNTER — Inpatient Hospital Stay (HOSPITAL_COMMUNITY): Payer: Self-pay

## 2015-12-28 LAB — CBC
HCT: 48.2 % (ref 39.0–52.0)
Hemoglobin: 16.7 g/dL (ref 13.0–17.0)
MCH: 30.8 pg (ref 26.0–34.0)
MCHC: 34.6 g/dL (ref 30.0–36.0)
MCV: 88.9 fL (ref 78.0–100.0)
Platelets: 196 K/uL (ref 150–400)
RBC: 5.42 MIL/uL (ref 4.22–5.81)
RDW: 13 % (ref 11.5–15.5)
WBC: 7.1 K/uL (ref 4.0–10.5)

## 2015-12-28 LAB — GLUCOSE, CAPILLARY
Glucose-Capillary: 147 mg/dL — ABNORMAL HIGH (ref 65–99)
Glucose-Capillary: 133 mg/dL — ABNORMAL HIGH (ref 65–99)
Glucose-Capillary: 150 mg/dL — ABNORMAL HIGH (ref 65–99)
Glucose-Capillary: 114 mg/dL — ABNORMAL HIGH (ref 65–99)

## 2015-12-28 LAB — BLOOD GAS, ARTERIAL
Acid-base deficit: 4.8 mmol/L — ABNORMAL HIGH (ref 0.0–2.0)
Bicarbonate: 18.9 mEq/L — ABNORMAL LOW (ref 20.0–24.0)
Drawn by: 105521
FIO2: 0.21
O2 Saturation: 98.8 %
Patient temperature: 98.6
TCO2: 19.8 mmol/L (ref 0–100)
pCO2 arterial: 29.8 mmHg — ABNORMAL LOW (ref 35.0–45.0)
pH, Arterial: 7.418 (ref 7.350–7.450)
pO2, Arterial: 134 mmHg — ABNORMAL HIGH (ref 80.0–100.0)

## 2015-12-28 LAB — PROTIME-INR
INR: 1.07 (ref 0.00–1.49)
Prothrombin Time: 14.1 seconds (ref 11.6–15.2)

## 2015-12-28 LAB — PREPARE RBC (CROSSMATCH)

## 2015-12-28 LAB — ABO/RH: ABO/RH(D): O POS

## 2015-12-28 LAB — HEPARIN LEVEL (UNFRACTIONATED): Heparin Unfractionated: 0.67 [IU]/mL (ref 0.30–0.70)

## 2015-12-28 LAB — APTT: aPTT: 118 seconds — ABNORMAL HIGH (ref 24–37)

## 2015-12-28 MED ORDER — NITROGLYCERIN IN D5W 200-5 MCG/ML-% IV SOLN
2.0000 ug/min | INTRAVENOUS | Status: AC
  Administered 2015-12-29: 5 ug/min via INTRAVENOUS
  Filled 2015-12-28: qty 250

## 2015-12-28 MED ORDER — DEXTROSE 5 % IV SOLN
1.5000 g | INTRAVENOUS | Status: AC
  Administered 2015-12-29: 750 g via INTRAVENOUS
  Administered 2015-12-29: 1.5 g via INTRAVENOUS
  Filled 2015-12-28: qty 1.5

## 2015-12-28 MED ORDER — INSULIN REGULAR HUMAN 100 UNIT/ML IJ SOLN
INTRAMUSCULAR | Status: AC
  Administered 2015-12-29: 1 [IU]/h via INTRAVENOUS
  Filled 2015-12-28: qty 2.5

## 2015-12-28 MED ORDER — DIAZEPAM 5 MG PO TABS
5.0000 mg | ORAL_TABLET | Freq: Once | ORAL | Status: AC
  Administered 2015-12-29: 5 mg via ORAL
  Filled 2015-12-28: qty 1

## 2015-12-28 MED ORDER — CHLORHEXIDINE GLUCONATE CLOTH 2 % EX PADS
6.0000 | MEDICATED_PAD | Freq: Once | CUTANEOUS | Status: AC
  Administered 2015-12-29: 6 via TOPICAL

## 2015-12-28 MED ORDER — CHLORHEXIDINE GLUCONATE 4 % EX LIQD
60.0000 mL | Freq: Once | CUTANEOUS | Status: AC
  Administered 2015-12-29: 4 via TOPICAL
  Filled 2015-12-28: qty 30

## 2015-12-28 MED ORDER — CHLORHEXIDINE GLUCONATE 0.12 % MT SOLN: 15.0000 mL | Freq: Once | OROMUCOSAL | Status: AC

## 2015-12-28 MED ORDER — TEMAZEPAM 15 MG PO CAPS: 15.0000 mg | ORAL_CAPSULE | Freq: Once | ORAL | Status: DC | PRN

## 2015-12-28 MED ORDER — VANCOMYCIN HCL 10 G IV SOLR
1500.0000 mg | INTRAVENOUS | Status: AC
  Administered 2015-12-29: 1500 mg via INTRAVENOUS
  Filled 2015-12-28: qty 1500

## 2015-12-28 MED ORDER — PHENYLEPHRINE HCL 10 MG/ML IJ SOLN
30.0000 ug/min | INTRAVENOUS | Status: DC
  Filled 2015-12-28: qty 2

## 2015-12-28 MED ORDER — DEXMEDETOMIDINE HCL IN NACL 400 MCG/100ML IV SOLN
0.1000 ug/kg/h | INTRAVENOUS | Status: AC
  Administered 2015-12-29: .3 ug/kg/h via INTRAVENOUS
  Filled 2015-12-28: qty 100

## 2015-12-28 MED ORDER — SODIUM CHLORIDE 0.9 % IV SOLN
INTRAVENOUS | Status: AC
  Administered 2015-12-29: 69.8 mL/h via INTRAVENOUS
  Filled 2015-12-28: qty 40

## 2015-12-28 MED ORDER — CHLORHEXIDINE GLUCONATE CLOTH 2 % EX PADS
6.0000 | MEDICATED_PAD | Freq: Once | CUTANEOUS | Status: AC
  Administered 2015-12-28: 6 via TOPICAL

## 2015-12-28 MED ORDER — SODIUM CHLORIDE 0.9 % IV SOLN
INTRAVENOUS | Status: DC
  Filled 2015-12-28: qty 30

## 2015-12-28 MED ORDER — CHLORHEXIDINE GLUCONATE 4 % EX LIQD
60.0000 mL | Freq: Once | CUTANEOUS | Status: AC
Start: 2015-12-28 — End: 2015-12-28
  Administered 2015-12-28: 4 via TOPICAL
  Filled 2015-12-28: qty 60

## 2015-12-28 MED ORDER — DEXTROSE 5 % IV SOLN
750.0000 mg | INTRAVENOUS | Status: DC
  Filled 2015-12-28: qty 750

## 2015-12-28 MED ORDER — BISACODYL 5 MG PO TBEC
5.0000 mg | DELAYED_RELEASE_TABLET | Freq: Once | ORAL | Status: AC
  Administered 2015-12-28: 5 mg via ORAL
  Filled 2015-12-28: qty 1

## 2015-12-28 MED ORDER — POTASSIUM CHLORIDE 2 MEQ/ML IV SOLN
80.0000 meq | INTRAVENOUS | Status: DC
  Filled 2015-12-28: qty 40

## 2015-12-28 MED ORDER — LORAZEPAM 0.5 MG PO TABS
0.5000 mg | ORAL_TABLET | ORAL | Status: DC | PRN
Start: 2015-12-28 — End: 2015-12-29

## 2015-12-28 MED ORDER — DOPAMINE-DEXTROSE 3.2-5 MG/ML-% IV SOLN
0.0000 ug/kg/min | INTRAVENOUS | Status: AC
  Administered 2015-12-29: 3 ug/kg/min via INTRAVENOUS
  Filled 2015-12-28: qty 250

## 2015-12-28 MED ORDER — MAGNESIUM SULFATE 50 % IJ SOLN
40.0000 meq | INTRAMUSCULAR | Status: DC
  Filled 2015-12-28: qty 10

## 2015-12-28 MED ORDER — PLASMA-LYTE 148 IV SOLN
INTRAVENOUS | Status: AC
  Administered 2015-12-29: 500 mL
  Filled 2015-12-28: qty 2.5

## 2015-12-28 MED ORDER — DEXTROSE 5 % IV SOLN
0.0000 ug/min | INTRAVENOUS | Status: DC
  Filled 2015-12-28: qty 4

## 2015-12-28 MED ORDER — CHLORHEXIDINE GLUCONATE 0.12 % MT SOLN
15.0000 mL | Freq: Once | OROMUCOSAL | Status: AC
Start: 2015-12-29 — End: 2015-12-29
  Administered 2015-12-29: 15 mL via OROMUCOSAL
  Filled 2015-12-28: qty 15

## 2015-12-28 NOTE — Progress Notes (Signed)
ANTICOAGULATION CONSULT NOTE - Follow Up Consult  Pharmacy Consult for heparin Indication: chest pain/ACS  No Known Allergies  Patient Measurements: Height: 5\' 9"  (175.3 cm) Weight: 196 lb 9.6 oz (89.177 kg) IBW/kg (Calculated) : 70.7  Vital Signs: Temp: 97.8 F (36.6 C) (01/08 0400) BP: 122/67 mmHg (01/08 0400) Pulse Rate: 52 (01/08 0804)  Labs:  Recent Labs  12/26/15 0540 12/27/15 0419 12/28/15 0600  HGB 16.3 16.4 16.7  HCT 47.5 47.3 48.2  PLT 201 203 196  HEPARINUNFRC 0.51 0.62 0.67  CREATININE 0.98  --   --     Estimated Creatinine Clearance: 85.2 mL/min (by C-G formula based on Cr of 0.98).   . heparin 1,850 Units/hr (12/27/15 16100956)     Assessment: 65 y/o s/p cardiac cath on 12/23/15, found with severe multivessel CAD.  Continuing on heparin, heparin level remains at goal on heparin at 1850 units/hr.  No angina today. CBC stable, no s/s of bleed. CABG planned for Monday.  Goal of Therapy:  Heparin level 0.3-0.7 units/ml Monitor platelets by anticoagulation protocol: Yes   Plan:  Continue heparin gtt at 1,850 units/hr Monitor daily HL, CBC, s/s of bleed CABG planned Monday 1/9  Tad MooreJessica Jaleia Hanke, Pharm D, BCPS  Clinical Pharmacist Pager [phone]  12/28/2015 10:11 AM

## 2015-12-28 NOTE — Progress Notes (Signed)
Subjective:  Feels well today without shortness of breath or chest pain.  Awaiting bypass grafting tomorrow.  Objective:  Vital Signs in the last 24 hours: BP 122/67 mmHg  Pulse 52  Temp(Src) 97.8 F (36.6 C) (Oral)  Resp 21  Ht 5\' 9"  (1.753 m)  Wt 89.177 kg (196 lb 9.6 oz)  BMI 29.02 kg/m2  SpO2 93%  Physical Exam: Obese male in no acute distress Lungs:  Clear  Cardiac:  Regular rhythm, normal S1 and S2, no S3 Extremities:  No edema present  Intake/Output from previous day: 01/07 0701 - 01/08 0700 In: 863 [P.O.:720; I.V.:143] Out: 1001 [Urine:1000; Stool:1] Weight Filed Weights   12/26/15 0500 12/27/15 0535 12/28/15 0400  Weight: 89.948 kg (198 lb 4.8 oz) 89.54 kg (197 lb 6.4 oz) 89.177 kg (196 lb 9.6 oz)    Lab Results: Basic Metabolic Panel:  Recent Labs  16/09/9600/06/17 0540  NA 139  K 3.9  CL 106  CO2 24  GLUCOSE 134*  BUN 13  CREATININE 0.98    CBC:  Recent Labs  12/27/15 0419 12/28/15 0600  WBC 7.8 7.1  HGB 16.4 16.7  HCT 47.3 48.2  MCV 88.6 88.9  PLT 203 196    BNP    Component Value Date/Time   BNP 34.9 12/21/2015 0650    PROTIME: Lab Results  Component Value Date   INR 1.09 12/21/2015    Telemetry: Normal sinus rhythm with sinus bradycardia  Assessment/Plan:  1.  Severe three-vessel coronary artery disease awaiting bypass grafting with no angina 2.  Diabetes mellitus borderline control 3.  Hyperlipidemia under treatment  Recommendations:  Continue anticoagulation and plan bypass tomorrow.  Questions answered regarding surgery.     Darden PalmerW. Spencer Tilley, Jr.  MD [facility] CenterFACC Cardiology  12/28/2015, 8:32 AM

## 2015-12-29 ENCOUNTER — Inpatient Hospital Stay (HOSPITAL_COMMUNITY): Payer: Self-pay | Admitting: Certified Registered Nurse Anesthetist

## 2015-12-29 ENCOUNTER — Encounter (HOSPITAL_COMMUNITY): Payer: Self-pay | Admitting: Certified Registered Nurse Anesthetist

## 2015-12-29 ENCOUNTER — Inpatient Hospital Stay (HOSPITAL_COMMUNITY): Payer: Self-pay

## 2015-12-29 ENCOUNTER — Inpatient Hospital Stay (HOSPITAL_COMMUNITY): Payer: MEDICAID | Admitting: Certified Registered Nurse Anesthetist

## 2015-12-29 ENCOUNTER — Encounter (HOSPITAL_COMMUNITY)
Admission: EM | Disposition: A | Discharge status: Home / Self Care | Payer: Self-pay | Source: Home / Self Care | Attending: Internal Medicine

## 2015-12-29 DIAGNOSIS — I25110 Atherosclerotic heart disease of native coronary artery with unstable angina pectoris: Secondary | ICD-10-CM

## 2015-12-29 HISTORY — PX: CORONARY ARTERY BYPASS GRAFT: SHX141

## 2015-12-29 HISTORY — PX: TEE WITHOUT CARDIOVERSION: SHX5443

## 2015-12-29 LAB — POCT I-STAT, CHEM 8
BUN: 16 mg/dL (ref 6–20)
BUN: 14 mg/dL (ref 6–20)
BUN: 15 mg/dL (ref 6–20)
BUN: 14 mg/dL (ref 6–20)
BUN: 12 mg/dL (ref 6–20)
BUN: 13 mg/dL (ref 6–20)
BUN: 14 mg/dL (ref 6–20)
CALCIUM ION: 1.29 mmol/L (ref 1.13–1.30)
CALCIUM ION: 0.96 mmol/L — AB (ref 1.13–1.30)
CHLORIDE: 101 mmol/L (ref 101–111)
CHLORIDE: 101 mmol/L (ref 101–111)
CHLORIDE: 102 mmol/L (ref 101–111)
CHLORIDE: 107 mmol/L (ref 101–111)
CREATININE: 0.7 mg/dL (ref 0.61–1.24)
CREATININE: 0.8 mg/dL (ref 0.61–1.24)
CREATININE: 0.6 mg/dL — AB (ref 0.61–1.24)
CREATININE: 0.6 mg/dL — AB (ref 0.61–1.24)
CREATININE: 0.8 mg/dL (ref 0.61–1.24)
Calcium, Ion: 1.05 mmol/L — ABNORMAL LOW (ref 1.13–1.30)
Calcium, Ion: 1.24 mmol/L (ref 1.13–1.30)
Calcium, Ion: 1.01 mmol/L — ABNORMAL LOW (ref 1.13–1.30)
Calcium, Ion: 0.97 mmol/L — ABNORMAL LOW (ref 1.13–1.30)
Calcium, Ion: 1.07 mmol/L — ABNORMAL LOW (ref 1.13–1.30)
Chloride: 102 mmol/L (ref 101–111)
Chloride: 104 mmol/L (ref 101–111)
Chloride: 99 mmol/L — ABNORMAL LOW (ref 101–111)
Creatinine, Ser: 0.8 mg/dL (ref 0.61–1.24)
Creatinine, Ser: 0.7 mg/dL (ref 0.61–1.24)
GLUCOSE: 119 mg/dL — AB (ref 65–99)
GLUCOSE: 137 mg/dL — AB (ref 65–99)
GLUCOSE: 170 mg/dL — AB (ref 65–99)
Glucose, Bld: 133 mg/dL — ABNORMAL HIGH (ref 65–99)
Glucose, Bld: 127 mg/dL — ABNORMAL HIGH (ref 65–99)
Glucose, Bld: 141 mg/dL — ABNORMAL HIGH (ref 65–99)
Glucose, Bld: 142 mg/dL — ABNORMAL HIGH (ref 65–99)
HCT: 35 % — ABNORMAL LOW (ref 39.0–52.0)
HEMATOCRIT: 51 % (ref 39.0–52.0)
HEMATOCRIT: 41 % (ref 39.0–52.0)
HEMATOCRIT: 49 % (ref 39.0–52.0)
HEMATOCRIT: 36 % — AB (ref 39.0–52.0)
HEMATOCRIT: 36 % — AB (ref 39.0–52.0)
HEMATOCRIT: 38 % — AB (ref 39.0–52.0)
HEMOGLOBIN: 17.3 g/dL — AB (ref 13.0–17.0)
HEMOGLOBIN: 16.7 g/dL (ref 13.0–17.0)
HEMOGLOBIN: 12.2 g/dL — AB (ref 13.0–17.0)
Hemoglobin: 13.9 g/dL (ref 13.0–17.0)
Hemoglobin: 11.9 g/dL — ABNORMAL LOW (ref 13.0–17.0)
Hemoglobin: 12.2 g/dL — ABNORMAL LOW (ref 13.0–17.0)
Hemoglobin: 12.9 g/dL — ABNORMAL LOW (ref 13.0–17.0)
POTASSIUM: 4.1 mmol/L (ref 3.5–5.1)
POTASSIUM: 4.9 mmol/L (ref 3.5–5.1)
POTASSIUM: 5.9 mmol/L — AB (ref 3.5–5.1)
POTASSIUM: 4.4 mmol/L (ref 3.5–5.1)
POTASSIUM: 3.8 mmol/L (ref 3.5–5.1)
Potassium: 4.3 mmol/L (ref 3.5–5.1)
Potassium: 4.3 mmol/L (ref 3.5–5.1)
SODIUM: 138 mmol/L (ref 135–145)
SODIUM: 138 mmol/L (ref 135–145)
SODIUM: 140 mmol/L (ref 135–145)
SODIUM: 135 mmol/L (ref 135–145)
Sodium: 136 mmol/L (ref 135–145)
Sodium: 132 mmol/L — ABNORMAL LOW (ref 135–145)
Sodium: 137 mmol/L (ref 135–145)
TCO2: 25 mmol/L (ref 0–100)
TCO2: 24 mmol/L (ref 0–100)
TCO2: 27 mmol/L (ref 0–100)
TCO2: 27 mmol/L (ref 0–100)
TCO2: 25 mmol/L (ref 0–100)
TCO2: 25 mmol/L (ref 0–100)
TCO2: 22 mmol/L (ref 0–100)

## 2015-12-29 LAB — CBC
HEMATOCRIT: 49.9 % (ref 39.0–52.0)
HEMATOCRIT: 44.1 % (ref 39.0–52.0)
HEMOGLOBIN: 17.8 g/dL — AB (ref 13.0–17.0)
Hemoglobin: 15.5 g/dL (ref 13.0–17.0)
MCH: 31.3 pg (ref 26.0–34.0)
MCH: 30.8 pg (ref 26.0–34.0)
MCHC: 35.7 g/dL (ref 30.0–36.0)
MCHC: 35.1 g/dL (ref 30.0–36.0)
MCV: 87.7 fL (ref 78.0–100.0)
MCV: 87.5 fL (ref 78.0–100.0)
PLATELETS: 147 10*3/uL — AB (ref 150–400)
Platelets: 167 10*3/uL (ref 150–400)
RBC: 5.69 MIL/uL (ref 4.22–5.81)
RBC: 5.04 MIL/uL (ref 4.22–5.81)
RDW: 13.1 % (ref 11.5–15.5)
RDW: 13 % (ref 11.5–15.5)
WBC: 6.9 10*3/uL (ref 4.0–10.5)
WBC: 15.2 10*3/uL — ABNORMAL HIGH (ref 4.0–10.5)

## 2015-12-29 LAB — POCT I-STAT 4, (NA,K, GLUC, HGB,HCT)
GLUCOSE: 160 mg/dL — AB (ref 65–99)
HEMATOCRIT: 46 % (ref 39.0–52.0)
HEMOGLOBIN: 15.6 g/dL (ref 13.0–17.0)
POTASSIUM: 3.8 mmol/L (ref 3.5–5.1)
SODIUM: 138 mmol/L (ref 135–145)

## 2015-12-29 LAB — BASIC METABOLIC PANEL
Anion gap: 12 (ref 5–15)
BUN: 16 mg/dL (ref 6–20)
CHLORIDE: 105 mmol/L (ref 101–111)
CO2: 20 mmol/L — AB (ref 22–32)
CREATININE: 0.94 mg/dL (ref 0.61–1.24)
Calcium: 9.4 mg/dL (ref 8.9–10.3)
GFR calc non Af Amer: >60 mL/min (ref >60)
Glucose, Bld: 140 mg/dL — ABNORMAL HIGH (ref 65–99)
POTASSIUM: 4.2 mmol/L (ref 3.5–5.1)
SODIUM: 137 mmol/L (ref 135–145)

## 2015-12-29 LAB — PLATELET COUNT: Platelets: 175 10*3/uL (ref 150–400)

## 2015-12-29 LAB — POCT I-STAT 3, ART BLOOD GAS (G3+)
ACID-BASE DEFICIT: 1 mmol/L (ref 0.0–2.0)
ACID-BASE DEFICIT: 2 mmol/L (ref 0.0–2.0)
ACID-BASE EXCESS: 4 mmol/L — AB (ref 0.0–2.0)
BICARBONATE: 29.3 meq/L — AB (ref 20.0–24.0)
BICARBONATE: 23.5 meq/L (ref 20.0–24.0)
BICARBONATE: 24.6 meq/L — AB (ref 20.0–24.0)
O2 SAT: 97 %
O2 Saturation: 100 %
O2 Saturation: 90 %
PCO2 ART: 44.9 mmHg (ref 35.0–45.0)
PH ART: 7.423 (ref 7.350–7.450)
PH ART: 7.342 — AB (ref 7.350–7.450)
PO2 ART: 406 mmHg — AB (ref 80.0–100.0)
PO2 ART: 92 mmHg (ref 80.0–100.0)
TCO2: 31 mmol/L (ref 0–100)
TCO2: 25 mmol/L (ref 0–100)
TCO2: 26 mmol/L (ref 0–100)
pCO2 arterial: 37.7 mmHg (ref 35.0–45.0)
pCO2 arterial: 44.9 mmHg (ref 35.0–45.0)
pH, Arterial: 7.403 (ref 7.350–7.450)
pO2, Arterial: 60 mmHg — ABNORMAL LOW (ref 80.0–100.0)

## 2015-12-29 LAB — HEMOGLOBIN AND HEMATOCRIT, BLOOD
HCT: 36.8 % — ABNORMAL LOW (ref 39.0–52.0)
Hemoglobin: 13.2 g/dL (ref 13.0–17.0)

## 2015-12-29 LAB — GLUCOSE, CAPILLARY: Glucose-Capillary: 138 mg/dL — ABNORMAL HIGH (ref 65–99)

## 2015-12-29 LAB — PROTIME-INR
INR: 1.39 (ref 0.00–1.49)
Prothrombin Time: 17.2 seconds — ABNORMAL HIGH (ref 11.6–15.2)

## 2015-12-29 LAB — HEPARIN LEVEL (UNFRACTIONATED): Heparin Unfractionated: 0.71 IU/mL — ABNORMAL HIGH (ref 0.30–0.70)

## 2015-12-29 SURGERY — CORONARY ARTERY BYPASS GRAFTING (CABG)
Anesthesia: General | Site: Chest

## 2015-12-29 MED ORDER — POTASSIUM CHLORIDE 10 MEQ/50ML IV SOLN
10.0000 meq | INTRAVENOUS | Status: AC
  Administered 2015-12-29 (×3): 10 meq via INTRAVENOUS

## 2015-12-29 MED ORDER — PHENYLEPHRINE HCL 10 MG/ML IJ SOLN
0.0000 ug/min | INTRAVENOUS | Status: DC
  Administered 2015-12-29 – 2015-12-30 (×2): 50 ug/min via INTRAVENOUS
  Filled 2015-12-29 (×2): qty 2

## 2015-12-29 MED ORDER — DEXTROSE 5 % IV SOLN
10.0000 mg | INTRAVENOUS | Status: DC | PRN
  Administered 2015-12-29: 50 ug/min via INTRAVENOUS

## 2015-12-29 MED ORDER — TRAMADOL HCL 50 MG PO TABS
50.0000 mg | ORAL_TABLET | ORAL | Status: DC | PRN
  Administered 2015-12-30 – 2015-12-31 (×2): 50 mg via ORAL
  Filled 2015-12-29 (×3): qty 1

## 2015-12-29 MED ORDER — LACTATED RINGERS IV SOLN
INTRAVENOUS | Status: DC | PRN
  Administered 2015-12-29: 10:00:00 via INTRAVENOUS

## 2015-12-29 MED ORDER — ALBUMIN HUMAN 5 % IV SOLN
250.0000 mL | INTRAVENOUS | Status: AC | PRN
  Administered 2015-12-29 – 2015-12-30 (×4): 250 mL via INTRAVENOUS
  Filled 2015-12-29 (×2): qty 250

## 2015-12-29 MED ORDER — HEMOSTATIC AGENTS (NO CHARGE) OPTIME
TOPICAL | Status: DC | PRN
  Administered 2015-12-29: 1 via TOPICAL

## 2015-12-29 MED ORDER — DEXMEDETOMIDINE HCL IN NACL 200 MCG/50ML IV SOLN
0.0000 ug/kg/h | INTRAVENOUS | Status: DC
  Administered 2015-12-29: 0.5 ug/kg/h via INTRAVENOUS
  Filled 2015-12-29: qty 50

## 2015-12-29 MED ORDER — SODIUM CHLORIDE 0.9 % IV SOLN
INTRAVENOUS | Status: DC | PRN
  Administered 2015-12-29: 17:00:00 via INTRAVENOUS

## 2015-12-29 MED ORDER — ACETAMINOPHEN 160 MG/5ML PO SOLN: 650.0000 mg | Freq: Once | ORAL | Status: AC

## 2015-12-29 MED ORDER — NITROGLYCERIN IN D5W 200-5 MCG/ML-% IV SOLN: 0.0000 ug/min | INTRAVENOUS | Status: DC

## 2015-12-29 MED ORDER — SODIUM CHLORIDE 0.9 % IV SOLN
Freq: Once | INTRAVENOUS | Status: DC
Start: 2015-12-29 — End: 2015-12-29

## 2015-12-29 MED ORDER — PANTOPRAZOLE SODIUM 40 MG PO TBEC
40.0000 mg | DELAYED_RELEASE_TABLET | Freq: Every day | ORAL | Status: DC
  Administered 2015-12-31 – 2016-01-03 (×4): 40 mg via ORAL
  Filled 2015-12-29 (×4): qty 1

## 2015-12-29 MED ORDER — SODIUM CHLORIDE 0.9 % IV SOLN
INTRAVENOUS | Status: DC
  Filled 2015-12-29: qty 2.5

## 2015-12-29 MED ORDER — ROCURONIUM BROMIDE 50 MG/5ML IV SOLN
INTRAVENOUS | Status: AC
  Filled 2015-12-29: qty 2

## 2015-12-29 MED ORDER — ACETAMINOPHEN 160 MG/5ML PO SOLN
1000.0000 mg | Freq: Four times a day (QID) | ORAL | Status: DC
  Administered 2016-01-01: 1000 mg
  Filled 2015-12-29: qty 40.6

## 2015-12-29 MED ORDER — DOCUSATE SODIUM 100 MG PO CAPS
200.0000 mg | ORAL_CAPSULE | Freq: Every day | ORAL | Status: DC
  Administered 2015-12-30 – 2016-01-02 (×4): 200 mg via ORAL
  Filled 2015-12-29 (×4): qty 2

## 2015-12-29 MED ORDER — MIDAZOLAM HCL 2 MG/2ML IJ SOLN
INTRAMUSCULAR | Status: AC
  Filled 2015-12-29: qty 2

## 2015-12-29 MED ORDER — MORPHINE SULFATE (PF) 2 MG/ML IV SOLN
2.0000 mg | INTRAVENOUS | Status: DC | PRN
  Administered 2015-12-29 – 2015-12-30 (×5): 2 mg via INTRAVENOUS
  Administered 2015-12-30: 4 mg via INTRAVENOUS
  Administered 2015-12-30 – 2016-01-01 (×5): 2 mg via INTRAVENOUS
  Filled 2015-12-29 (×5): qty 1
  Filled 2015-12-29: qty 2
  Filled 2015-12-29 (×5): qty 1

## 2015-12-29 MED ORDER — FENTANYL CITRATE (PF) 250 MCG/5ML IJ SOLN
INTRAMUSCULAR | Status: AC
  Filled 2015-12-29: qty 5

## 2015-12-29 MED ORDER — FENTANYL CITRATE (PF) 100 MCG/2ML IJ SOLN
INTRAMUSCULAR | Status: AC
  Filled 2015-12-29: qty 2

## 2015-12-29 MED ORDER — SODIUM CHLORIDE 0.9 % IJ SOLN
INTRAMUSCULAR | Status: AC
  Filled 2015-12-29: qty 10

## 2015-12-29 MED ORDER — FENTANYL CITRATE (PF) 250 MCG/5ML IJ SOLN
INTRAMUSCULAR | Status: AC
  Filled 2015-12-29: qty 20

## 2015-12-29 MED ORDER — BISACODYL 5 MG PO TBEC
10.0000 mg | DELAYED_RELEASE_TABLET | Freq: Every day | ORAL | Status: DC
  Administered 2015-12-30 – 2016-01-01 (×3): 10 mg via ORAL
  Filled 2015-12-29 (×3): qty 2

## 2015-12-29 MED ORDER — SODIUM CHLORIDE 0.9 % IJ SOLN
OROMUCOSAL | Status: DC | PRN
  Administered 2015-12-29 (×3): 4 mL via TOPICAL

## 2015-12-29 MED ORDER — MIDAZOLAM HCL 10 MG/2ML IJ SOLN
INTRAMUSCULAR | Status: AC
  Filled 2015-12-29: qty 2

## 2015-12-29 MED ORDER — ASPIRIN EC 325 MG PO TBEC
325.0000 mg | DELAYED_RELEASE_TABLET | Freq: Every day | ORAL | Status: DC
  Administered 2015-12-30 – 2016-01-03 (×5): 325 mg via ORAL
  Filled 2015-12-29 (×5): qty 1

## 2015-12-29 MED ORDER — HEPARIN SODIUM (PORCINE) 1000 UNIT/ML IJ SOLN
INTRAMUSCULAR | Status: AC
  Filled 2015-12-29: qty 1

## 2015-12-29 MED ORDER — CHLORHEXIDINE GLUCONATE 0.12 % MT SOLN
15.0000 mL | OROMUCOSAL | Status: AC
  Administered 2015-12-29: 15 mL via OROMUCOSAL
  Filled 2015-12-29: qty 15

## 2015-12-29 MED ORDER — PROTAMINE SULFATE 10 MG/ML IV SOLN
INTRAVENOUS | Status: AC
  Filled 2015-12-29: qty 25

## 2015-12-29 MED ORDER — CETYLPYRIDINIUM CHLORIDE 0.05 % MT LIQD
7.0000 mL | Freq: Two times a day (BID) | OROMUCOSAL | Status: DC
  Administered 2015-12-30: 7 mL via OROMUCOSAL

## 2015-12-29 MED ORDER — VANCOMYCIN HCL IN DEXTROSE 1-5 GM/200ML-% IV SOLN
1000.0000 mg | Freq: Two times a day (BID) | INTRAVENOUS | Status: AC
  Administered 2015-12-29 – 2015-12-31 (×3): 1000 mg via INTRAVENOUS
  Filled 2015-12-29 (×3): qty 200

## 2015-12-29 MED ORDER — MIDAZOLAM HCL 2 MG/2ML IJ SOLN: 2.0000 mg | INTRAMUSCULAR | Status: DC | PRN

## 2015-12-29 MED ORDER — CEFUROXIME SODIUM 1.5 G IJ SOLR
1.5000 g | Freq: Two times a day (BID) | INTRAMUSCULAR | Status: AC
  Administered 2015-12-30 – 2015-12-31 (×3): 1.5 g via INTRAVENOUS
  Filled 2015-12-29 (×4): qty 1.5

## 2015-12-29 MED ORDER — PROPOFOL 10 MG/ML IV BOLUS
INTRAVENOUS | Status: DC | PRN
  Administered 2015-12-29: 50 mg via INTRAVENOUS

## 2015-12-29 MED ORDER — ROCURONIUM BROMIDE 100 MG/10ML IV SOLN
INTRAVENOUS | Status: DC | PRN
  Administered 2015-12-29: 40 mg via INTRAVENOUS
  Administered 2015-12-29: 50 mg via INTRAVENOUS
  Administered 2015-12-29: 40 mg via INTRAVENOUS
  Administered 2015-12-29: 50 mg via INTRAVENOUS
  Administered 2015-12-29: 60 mg via INTRAVENOUS

## 2015-12-29 MED ORDER — MAGNESIUM SULFATE 4 GM/100ML IV SOLN
4.0000 g | Freq: Once | INTRAVENOUS | Status: AC
  Administered 2015-12-29: 4 g via INTRAVENOUS
  Filled 2015-12-29: qty 100

## 2015-12-29 MED ORDER — 0.9 % SODIUM CHLORIDE (POUR BTL) OPTIME
TOPICAL | Status: DC | PRN
  Administered 2015-12-29: 6000 mL

## 2015-12-29 MED ORDER — GLYCOPYRROLATE 0.2 MG/ML IJ SOLN
INTRAMUSCULAR | Status: DC | PRN
  Administered 2015-12-29: .2 mg via INTRAVENOUS
  Administered 2015-12-29: 0.2 mg via INTRAVENOUS

## 2015-12-29 MED ORDER — INSULIN REGULAR BOLUS VIA INFUSION
0.0000 [IU] | Freq: Three times a day (TID) | INTRAVENOUS | Status: DC
  Filled 2015-12-29: qty 10

## 2015-12-29 MED ORDER — ONDANSETRON HCL 4 MG/2ML IJ SOLN
4.0000 mg | Freq: Four times a day (QID) | INTRAMUSCULAR | Status: DC | PRN
  Administered 2015-12-30: 4 mg via INTRAVENOUS
  Filled 2015-12-29: qty 2

## 2015-12-29 MED ORDER — SODIUM CHLORIDE 0.45 % IV SOLN
INTRAVENOUS | Status: DC | PRN
  Administered 2015-12-29: 18:00:00 via INTRAVENOUS

## 2015-12-29 MED ORDER — METOPROLOL TARTRATE 1 MG/ML IV SOLN: 2.5000 mg | INTRAVENOUS | Status: DC | PRN

## 2015-12-29 MED ORDER — PHENYLEPHRINE HCL 10 MG/ML IJ SOLN
INTRAMUSCULAR | Status: DC | PRN
  Administered 2015-12-29: 120 ug via INTRAVENOUS

## 2015-12-29 MED ORDER — DOPAMINE-DEXTROSE 3.2-5 MG/ML-% IV SOLN: 0.0000 ug/kg/min | INTRAVENOUS | Status: DC

## 2015-12-29 MED ORDER — METOPROLOL TARTRATE 12.5 MG HALF TABLET
12.5000 mg | ORAL_TABLET | Freq: Two times a day (BID) | ORAL | Status: DC
  Administered 2015-12-30 – 2016-01-03 (×8): 12.5 mg via ORAL
  Filled 2015-12-29 (×9): qty 1

## 2015-12-29 MED ORDER — LACTATED RINGERS IV SOLN: 500.0000 mL | Freq: Once | INTRAVENOUS | Status: DC | PRN

## 2015-12-29 MED ORDER — PROTAMINE SULFATE 10 MG/ML IV SOLN
INTRAVENOUS | Status: AC
  Filled 2015-12-29: qty 10

## 2015-12-29 MED ORDER — CHLORHEXIDINE GLUCONATE 0.12 % MT SOLN
15.0000 mL | Freq: Two times a day (BID) | OROMUCOSAL | Status: DC
Start: 2015-12-30 — End: 2015-12-30
  Administered 2015-12-30: 15 mL via OROMUCOSAL

## 2015-12-29 MED ORDER — ARTIFICIAL TEARS OP OINT
TOPICAL_OINTMENT | OPHTHALMIC | Status: DC | PRN
  Administered 2015-12-29: 1 via OPHTHALMIC

## 2015-12-29 MED ORDER — METOPROLOL TARTRATE 25 MG/10 ML ORAL SUSPENSION: 12.5000 mg | Freq: Two times a day (BID) | ORAL | Status: DC

## 2015-12-29 MED ORDER — HEPARIN SODIUM (PORCINE) 1000 UNIT/ML IJ SOLN
INTRAMUSCULAR | Status: DC | PRN
  Administered 2015-12-29: 28000 [IU] via INTRAVENOUS
  Administered 2015-12-29: 2000 [IU] via INTRAVENOUS
  Administered 2015-12-29: 3000 [IU] via INTRAVENOUS

## 2015-12-29 MED ORDER — VANCOMYCIN HCL IN DEXTROSE 1-5 GM/200ML-% IV SOLN
1000.0000 mg | Freq: Once | INTRAVENOUS | Status: DC
  Filled 2015-12-29: qty 200

## 2015-12-29 MED ORDER — LACTATED RINGERS IV SOLN
INTRAVENOUS | Status: DC
  Administered 2015-12-29: 18:00:00 via INTRAVENOUS

## 2015-12-29 MED ORDER — BISACODYL 10 MG RE SUPP: 10.0000 mg | Freq: Every day | RECTAL | Status: DC

## 2015-12-29 MED ORDER — SODIUM CHLORIDE 0.9 % IV SOLN: 250.0000 mL | INTRAVENOUS | Status: DC

## 2015-12-29 MED ORDER — FAMOTIDINE IN NACL 20-0.9 MG/50ML-% IV SOLN
20.0000 mg | Freq: Two times a day (BID) | INTRAVENOUS | Status: AC
  Administered 2015-12-29 – 2015-12-30 (×2): 20 mg via INTRAVENOUS
  Filled 2015-12-29: qty 50

## 2015-12-29 MED ORDER — FENTANYL CITRATE (PF) 100 MCG/2ML IJ SOLN
INTRAMUSCULAR | Status: DC | PRN
  Administered 2015-12-29: 100 ug via INTRAVENOUS
  Administered 2015-12-29 (×3): 50 ug via INTRAVENOUS
  Administered 2015-12-29: 100 ug via INTRAVENOUS
  Administered 2015-12-29: 1000 ug via INTRAVENOUS
  Administered 2015-12-29: 50 ug via INTRAVENOUS
  Administered 2015-12-29: 150 ug via INTRAVENOUS
  Administered 2015-12-29 (×4): 50 ug via INTRAVENOUS

## 2015-12-29 MED ORDER — ACETAMINOPHEN 650 MG RE SUPP
650.0000 mg | Freq: Once | RECTAL | Status: AC
  Administered 2015-12-29: 650 mg via RECTAL

## 2015-12-29 MED ORDER — SODIUM CHLORIDE 0.9 % IJ SOLN: 3.0000 mL | INTRAMUSCULAR | Status: DC | PRN

## 2015-12-29 MED ORDER — SODIUM CHLORIDE 0.9 % IV SOLN
20.0000 ug | INTRAVENOUS | Status: AC
  Administered 2015-12-29: 20 ug via INTRAVENOUS
  Filled 2015-12-29: qty 5

## 2015-12-29 MED ORDER — SODIUM CHLORIDE 0.9 % IJ SOLN
3.0000 mL | Freq: Two times a day (BID) | INTRAMUSCULAR | Status: DC
  Administered 2015-12-30 – 2016-01-02 (×6): 3 mL via INTRAVENOUS

## 2015-12-29 MED ORDER — MIDAZOLAM HCL 5 MG/5ML IJ SOLN
INTRAMUSCULAR | Status: DC | PRN
  Administered 2015-12-29 (×2): 2 mg via INTRAVENOUS
  Administered 2015-12-29: 5 mg via INTRAVENOUS
  Administered 2015-12-29: 3 mg via INTRAVENOUS

## 2015-12-29 MED ORDER — ACETAMINOPHEN 500 MG PO TABS
1000.0000 mg | ORAL_TABLET | Freq: Four times a day (QID) | ORAL | Status: DC
  Administered 2015-12-30 – 2016-01-03 (×13): 1000 mg via ORAL
  Filled 2015-12-29 (×13): qty 2

## 2015-12-29 MED ORDER — PROTAMINE SULFATE 10 MG/ML IV SOLN
INTRAVENOUS | Status: DC | PRN
  Administered 2015-12-29: 150 mg via INTRAVENOUS
  Administered 2015-12-29: 10 mg via INTRAVENOUS
  Administered 2015-12-29: 150 mg via INTRAVENOUS
  Administered 2015-12-29: 20 mg via INTRAVENOUS

## 2015-12-29 MED ORDER — MORPHINE SULFATE (PF) 2 MG/ML IV SOLN
1.0000 mg | INTRAVENOUS | Status: AC | PRN
  Administered 2015-12-29: 2 mg via INTRAVENOUS
  Filled 2015-12-29: qty 1

## 2015-12-29 MED ORDER — ASPIRIN 81 MG PO CHEW: 324.0000 mg | CHEWABLE_TABLET | Freq: Every day | ORAL | Status: DC

## 2015-12-29 MED ORDER — LACTATED RINGERS IV SOLN: INTRAVENOUS | Status: DC

## 2015-12-29 MED ORDER — SODIUM CHLORIDE 0.9 % IV SOLN
INTRAVENOUS | Status: DC
  Administered 2015-12-29: 18:00:00 via INTRAVENOUS

## 2015-12-29 MED ORDER — PROPOFOL 10 MG/ML IV BOLUS
INTRAVENOUS | Status: AC
  Filled 2015-12-29: qty 20

## 2015-12-29 MED ORDER — OXYCODONE HCL 5 MG PO TABS
5.0000 mg | ORAL_TABLET | ORAL | Status: DC | PRN
  Administered 2016-01-02: 5 mg via ORAL
  Filled 2015-12-29: qty 1

## 2015-12-29 SURGICAL SUPPLY — 112 items
ADAPTER CARDIO PERF ANTE/RETRO (ADAPTER) ×4 IMPLANT
ADPR PRFSN 84XANTGRD RTRGD (ADAPTER) ×2
BAG DECANTER FOR FLEXI CONT (MISCELLANEOUS) ×4 IMPLANT
BANDAGE ACE 4X5 VEL STRL LF (GAUZE/BANDAGES/DRESSINGS) ×2 IMPLANT
BANDAGE ACE 6X5 VEL STRL LF (GAUZE/BANDAGES/DRESSINGS) ×2 IMPLANT
BANDAGE ELASTIC 4 VELCRO ST LF (GAUZE/BANDAGES/DRESSINGS) ×4 IMPLANT
BANDAGE ELASTIC 6 VELCRO ST LF (GAUZE/BANDAGES/DRESSINGS) ×4 IMPLANT
BASKET HEART  (ORDER IN 25'S) (MISCELLANEOUS) ×1
BASKET HEART (ORDER IN 25'S) (MISCELLANEOUS) ×1
BASKET HEART (ORDER IN 25S) (MISCELLANEOUS) ×2 IMPLANT
BLADE STERNUM SYSTEM 6 (BLADE) ×4 IMPLANT
BLADE SURG 11 STRL SS (BLADE) ×2 IMPLANT
BLADE SURG 12 STRL SS (BLADE) ×4 IMPLANT
BLADE SURG ROTATE 9660 (MISCELLANEOUS) IMPLANT
BNDG GAUZE ELAST 4 BULKY (GAUZE/BANDAGES/DRESSINGS) ×4 IMPLANT
CANISTER SUCTION 2500CC (MISCELLANEOUS) ×4 IMPLANT
CANNULA GUNDRY RCSP 15FR (MISCELLANEOUS) ×4 IMPLANT
CANNULA VESSEL 3MM BLUNT TIP (CANNULA) ×2 IMPLANT
CATH CPB KIT VANTRIGT (MISCELLANEOUS) ×4 IMPLANT
CATH ROBINSON RED A/P 18FR (CATHETERS) ×12 IMPLANT
CATH THORACIC 36FR RT ANG (CATHETERS) ×4 IMPLANT
CLIP FOGARTY SPRING 6M (CLIP) ×2 IMPLANT
CLIP RETRACTION 3.0MM CORONARY (MISCELLANEOUS) ×2 IMPLANT
CLIP TI WIDE RED SMALL 24 (CLIP) ×4 IMPLANT
COVER SURGICAL LIGHT HANDLE (MISCELLANEOUS) ×4 IMPLANT
CRADLE DONUT ADULT HEAD (MISCELLANEOUS) ×4 IMPLANT
DRAIN CHANNEL 32F RND 10.7 FF (WOUND CARE) ×4 IMPLANT
DRAPE CARDIOVASCULAR INCISE (DRAPES) ×4
DRAPE SLUSH/WARMER DISC (DRAPES) ×4 IMPLANT
DRAPE SRG 135X102X78XABS (DRAPES) ×2 IMPLANT
DRSG AQUACEL AG ADV 3.5X14 (GAUZE/BANDAGES/DRESSINGS) ×4 IMPLANT
ELECT BLADE 4.0 EZ CLEAN MEGAD (MISCELLANEOUS) ×4
ELECT BLADE 6.5 EXT (BLADE) ×4 IMPLANT
ELECT CAUTERY BLADE 6.4 (BLADE) ×4 IMPLANT
ELECT REM PT RETURN 9FT ADLT (ELECTROSURGICAL) ×8
ELECTRODE BLDE 4.0 EZ CLN MEGD (MISCELLANEOUS) ×2 IMPLANT
ELECTRODE REM PT RTRN 9FT ADLT (ELECTROSURGICAL) ×4 IMPLANT
GAUZE SPONGE 4X4 12PLY STRL (GAUZE/BANDAGES/DRESSINGS) ×8 IMPLANT
GLOVE BIO SURGEON STRL SZ 6.5 (GLOVE) ×2 IMPLANT
GLOVE BIO SURGEON STRL SZ7 (GLOVE) ×8 IMPLANT
GLOVE BIO SURGEON STRL SZ7.5 (GLOVE) ×12 IMPLANT
GLOVE BIO SURGEON STRL SZ8 (GLOVE) ×2 IMPLANT
GLOVE BIO SURGEONS STRL SZ 6.5 (GLOVE) ×2
GLOVE BIOGEL PI IND STRL 6 (GLOVE) IMPLANT
GLOVE BIOGEL PI INDICATOR 6 (GLOVE) ×4
GOWN STRL REUS W/ TWL LRG LVL3 (GOWN DISPOSABLE) ×8 IMPLANT
GOWN STRL REUS W/TWL LRG LVL3 (GOWN DISPOSABLE) ×16
HEMOSTAT POWDER SURGIFOAM 1G (HEMOSTASIS) ×12 IMPLANT
HEMOSTAT SURGICEL 2X14 (HEMOSTASIS) ×4 IMPLANT
INSERT FOGARTY XLG (MISCELLANEOUS) IMPLANT
KIT BASIN OR (CUSTOM PROCEDURE TRAY) ×4 IMPLANT
KIT ROOM TURNOVER OR (KITS) ×4 IMPLANT
KIT SUCTION CATH 14FR (SUCTIONS) ×4 IMPLANT
KIT VASOVIEW W/TROCAR VH 2000 (KITS) ×4 IMPLANT
LEAD PACING MYOCARDI (MISCELLANEOUS) ×4 IMPLANT
LIQUID BAND (GAUZE/BANDAGES/DRESSINGS) ×2 IMPLANT
MARKER GRAFT CORONARY BYPASS (MISCELLANEOUS) ×12 IMPLANT
NS IRRIG 1000ML POUR BTL (IV SOLUTION) ×22 IMPLANT
PACK OPEN HEART (CUSTOM PROCEDURE TRAY) ×4 IMPLANT
PAD ARMBOARD 7.5X6 YLW CONV (MISCELLANEOUS) ×8 IMPLANT
PAD ELECT DEFIB RADIOL ZOLL (MISCELLANEOUS) ×4 IMPLANT
PENCIL BUTTON HOLSTER BLD 10FT (ELECTRODE) ×4 IMPLANT
PUNCH AORTIC ROTATE 4.0MM (MISCELLANEOUS) ×2 IMPLANT
PUNCH AORTIC ROTATE 4.5MM 8IN (MISCELLANEOUS) IMPLANT
PUNCH AORTIC ROTATE 5MM 8IN (MISCELLANEOUS) IMPLANT
SET CARDIOPLEGIA MPS 5001102 (MISCELLANEOUS) ×2 IMPLANT
SPONGE GAUZE 4X4 12PLY STER LF (GAUZE/BANDAGES/DRESSINGS) ×4 IMPLANT
SPONGE LAP 18X18 X RAY DECT (DISPOSABLE) ×4 IMPLANT
SPONGE LAP 4X18 X RAY DECT (DISPOSABLE) ×2 IMPLANT
SURGIFLO W/THROMBIN 8M KIT (HEMOSTASIS) ×4 IMPLANT
SUT BONE WAX W31G (SUTURE) ×4 IMPLANT
SUT ETHIBOND 2 0 SH (SUTURE) ×16
SUT ETHIBOND 2 0 SH 36X2 (SUTURE) IMPLANT
SUT MNCRL AB 4-0 PS2 18 (SUTURE) ×2 IMPLANT
SUT PROLENE 3 0 SH DA (SUTURE) IMPLANT
SUT PROLENE 3 0 SH1 36 (SUTURE) IMPLANT
SUT PROLENE 4 0 RB 1 (SUTURE) ×16
SUT PROLENE 4 0 SH DA (SUTURE) ×6 IMPLANT
SUT PROLENE 4-0 RB1 .5 CRCL 36 (SUTURE) ×2 IMPLANT
SUT PROLENE 5 0 C 1 36 (SUTURE) ×4 IMPLANT
SUT PROLENE 6 0 C 1 30 (SUTURE) ×12 IMPLANT
SUT PROLENE 6 0 CC (SUTURE) ×12 IMPLANT
SUT PROLENE 8 0 BV175 6 (SUTURE) ×8 IMPLANT
SUT PROLENE BLUE 7 0 (SUTURE) ×10 IMPLANT
SUT SILK  1 MH (SUTURE) ×6
SUT SILK 1 MH (SUTURE) IMPLANT
SUT SILK 2 0 SH CR/8 (SUTURE) ×6 IMPLANT
SUT SILK 2 0 TIES 10X30 (SUTURE) ×2 IMPLANT
SUT SILK 2 0 TIES 17X18 (SUTURE) ×4
SUT SILK 2-0 18XBRD TIE BLK (SUTURE) IMPLANT
SUT SILK 3 0 SH CR/8 (SUTURE) ×4 IMPLANT
SUT SILK 4 0 TIE 10X30 (SUTURE) ×4 IMPLANT
SUT STEEL 6MS V (SUTURE) ×8 IMPLANT
SUT STEEL SZ 6 DBL 3X14 BALL (SUTURE) ×4 IMPLANT
SUT TEM PAC WIRE 2 0 SH (SUTURE) ×2 IMPLANT
SUT VIC AB 1 CTX 36 (SUTURE) ×12
SUT VIC AB 1 CTX36XBRD ANBCTR (SUTURE) ×4 IMPLANT
SUT VIC AB 2-0 CT1 27 (SUTURE) ×4
SUT VIC AB 2-0 CT1 TAPERPNT 27 (SUTURE) IMPLANT
SUT VIC AB 2-0 CTX 27 (SUTURE) ×4 IMPLANT
SUT VIC AB 3-0 X1 27 (SUTURE) ×4 IMPLANT
SUTURE E-PAK OPEN HEART (SUTURE) ×4 IMPLANT
SYSTEM SAHARA CHEST DRAIN ATS (WOUND CARE) ×4 IMPLANT
TAPE CLOTH SURG 4X10 WHT LF (GAUZE/BANDAGES/DRESSINGS) ×2 IMPLANT
TAPE PAPER 2X10 WHT MICROPORE (GAUZE/BANDAGES/DRESSINGS) ×2 IMPLANT
TOWEL OR 17X24 6PK STRL BLUE (TOWEL DISPOSABLE) ×8 IMPLANT
TOWEL OR 17X26 10 PK STRL BLUE (TOWEL DISPOSABLE) ×8 IMPLANT
TRAY FOLEY IC TEMP SENS 16FR (CATHETERS) ×4 IMPLANT
TUBING INSUFFLATION (TUBING) ×4 IMPLANT
UNDERPAD 30X30 INCONTINENT (UNDERPADS AND DIAPERS) ×4 IMPLANT
WATER STERILE IRR 1000ML POUR (IV SOLUTION) ×8 IMPLANT
YANKAUER SUCT BULB TIP NO VENT (SUCTIONS) ×2 IMPLANT

## 2015-12-29 NOTE — Anesthesia Preprocedure Evaluation (Signed)
Anesthesia Evaluation  Patient identified by MRN, date of birth, ID band Patient awake    Reviewed: Allergy & Precautions, NPO status , [name] Chart, lab work & pertinent test results  Airway Mallampati: I  TM Distance: >3 FB Neck ROM: Full    Dental   Pulmonary former smoker,    Pulmonary exam normal        Cardiovascular + angina + CAD and + Past MI  Normal cardiovascular exam     Neuro/Psych    GI/Hepatic   Endo/Other  diabetes, Type 2, Oral Hypoglycemic Agents  Renal/GU      Musculoskeletal   Abdominal   Peds  Hematology   Anesthesia Other Findings   Reproductive/Obstetrics                             Anesthesia Physical Anesthesia Plan  ASA: III  Anesthesia Plan: General   Post-op Pain Management:    Induction: Intravenous  Airway Management Planned: Oral ETT  Additional Equipment: Arterial line, CVP, PA Cath and TEE  Intra-op Plan:   Post-operative Plan: Post-operative intubation/ventilation  Informed Consent: I have reviewed the patients History and Physical, chart, labs and discussed the procedure including the risks, benefits and alternatives for the proposed anesthesia with the patient or authorized representative who has indicated his/her understanding and acceptance.     Plan Discussed with: CRNA and Surgeon  Anesthesia Plan Comments:         Anesthesia Quick Evaluation

## 2015-12-29 NOTE — Anesthesia Postprocedure Evaluation (Addendum)
Anesthesia Post Note  Patient: Marijo ConceptionLarry J Elkhatib  Procedure(s) Performed: Procedure(s) (LRB): CORONARY ARTERY BYPASS GRAFTING (CABG) x four,  using left internal mammary artery and right leg greater saphenous vein harvested endoscopically (N/A) TRANSESOPHAGEAL ECHOCARDIOGRAM (TEE) (N/A)  Patient location during evaluation: SICU Anesthesia Type: General Level of consciousness: sedated Pain management: pain level controlled Vital Signs Assessment: post-procedure vital signs reviewed and stable Respiratory status: patient remains intubated per anesthesia plan Cardiovascular status: stable Anesthetic complications: no    Last Vitals:  Filed Vitals:   12/29/15 1844 12/29/15 1900  BP:  99/73  Pulse:    Temp: 36.4 C 36.4 C  Resp: 17 16    Last Pain:  Filed Vitals:   12/29/15 1912  PainSc: 0-No pain                 Zeke Aker DAVID

## 2015-12-29 NOTE — Brief Op Note (Signed)
12/21/2015 - 12/29/2015  3:07 PM  PATIENT:  Adrian Foley  65 y.o. male  PRE-OPERATIVE DIAGNOSIS:  1. S/p NSTEMI 2.CAD  POST-OPERATIVE DIAGNOSIS:  1. S/p NSTEMI 2.CAD  PROCEDURE:  TRANSESOPHAGEAL ECHOCARDIOGRAM (TEE), MEDIAN STERNOTOMY for  CORONARY ARTERY BYPASS GRAFTING (CABG) x four,  (LIMA to LAD, SVG to OM, SVG to RAMUS INTERMEDIATE, SVG to RCA) using left internal mammary artery and right leg greater saphenous vein harvested endoscopically   SURGEON:  Surgeon(s) and Role:    * Kerin PernaPeter Van Trigt, MD - Primary  PHYSICIAN ASSISTANT: Doree Fudgeonielle Jamyrah Saur PA-C  ANESTHESIA:   general  EBL:  Total I/O In: 0  Out: 500 [Urine:500]  BLOOD ADMINISTERED:Two FFP  DRAINS: Chest tubes placed in the mediastinal and pleural spaces   COUNTS CORRECT:  YES  DICTATION: .Dragon Dictation  PLAN OF CARE: Admit to inpatient   PATIENT DISPOSITION:  ICU - intubated and hemodynamically stable.   Delay start of Pharmacological VTE agent (>24hrs) due to surgical blood loss or risk of bleeding: yes  BASELINE WEIGHT: 88 kg

## 2015-12-29 NOTE — Progress Notes (Signed)
  Echocardiogram Echocardiogram Transesophageal has been performed.  Adrian SavoyCasey N Shneur Foley 12/29/2015, 11:24 AM

## 2015-12-29 NOTE — Op Note (Signed)
Adrian Foley, Adrian Foley NO.:  [id number]  MEDICAL RECORD NO.:  [id number]  LOCATION:  2S07C                        FACILITY:  [facility]  PHYSICIAN:  Adrian Foley, M.D.  DATE OF BIRTH:  1951/05/17  DATE OF PROCEDURE: DATE OF DISCHARGE:                              OPERATIVE REPORT   OPERATIONS: 1. Coronary artery bypass grafting x4 (left internal mammary artery to     left anterior descending artery as a free graft, saphenous vein     graft to ramus intermedius, saphenous vein graft to obtuse     marginal, saphenous vein graft to posterolateral branch of the     right coronary). 2. Endoscopic harvest of right leg greater saphenous vein.  SURGEON:  Adrian Foley, M.D.  ASSISTANT:  Adrian Fudge, PA-C.  PREOPERATIVE DIAGNOSES:  Unstable angina, severe multivessel coronary artery disease, previous percutaneous coronary intervention to the left anterior descending artery several years ago.  POSTOPERATIVE DIAGNOSES:  Unstable angina, severe multivessel coronary artery disease, previous percutaneous coronary intervention to the left anterior descending artery several years ago.  ANESTHESIA:  General by Dr. Arta Bruce.  INDICATIONS:  The patient is a 65 year old Caucasian male, with prior history of coronary artery disease, treated with PCI to the LAD, who presents with unstable angina.  Cardiac catheterization by Dr. Herbie Baltimore demonstrated severe multivessel coronary artery disease and was felt the patient would be best treated with surgical revascularization.  I discussed the procedure of CABG with the patient and his family including the indications, benefits, alternatives and risks.  I discussed the details of the procedure including the use of general anesthesia and cardiopulmonary bypass, the location of the surgical incisions, and expected postoperative hospital recovery.  I discussed with the patient the risks to him of CABG including the risk of  stroke, bleeding, blood transfusion requirement, MI, postoperative infection, postoperative pulmonary problems including pleural effusion, and death. After reviewing these issues, he demonstrated his understanding and agreed to proceed with surgery under what I felt was an informed consent.  OPERATIVE FINDINGS: 1. The posterolateral branch of the right coronary and the obtuse     marginal were adequate targets.  The LAD and the ramus intermedius     were very small and suboptimal targets.  The patient would not be a     candidate for redo CABG. 2. LV function was fairly well preserved with some mild apical     hypokinesia.  No packed cell transfusions required for the surgery.  DESCRIPTION OF PROCEDURE:  The patient was brought to the operating room and placed supine on the operating table, general anesthesia was induced under invasive hemodynamic monitoring.  A proper time-out was performed as the patient's chest, abdomen and legs were prepped with Betadine and draped as a sterile field.  A sternal incision was made as the saphenous vein was harvested endoscopically from the right leg.  The vein was somewhat small, but of adequate quality.  The mammary artery was small, but had adequate flow.  The mammary artery was very small distally and it was decided to use a free mammary artery graft as a pedicle graft would not reach the LAD because  the distal portion of the mammary was too small to use.  Sternal retractor was placed and the pericardium was opened and suspended.  Purse-strings were placed in the ascending aorta and right atrium and the patient was fully heparinized and the ACT was documented as being therapeutic.  The patient was then cannulated and placed on cardiopulmonary bypass.  The coronaries were identified for grafting. The LAD and ramus were suboptimal targets.  The OM and the posterolateral right coronaries were adequate targets.  Cardioplegia cannulas were placed  for both antegrade and retrograde cold blood cardioplegia.  The patient was cooled to 32 degrees and aortic crossclamp was applied.  One liter of cold blood cardioplegia was delivered with good cardioplegic arrest.  Septal temperature dropped less than 14 degrees.  Cardioplegia was delivered every 20 minutes.  The distal coronary anastomoses were performed.  The first distal anastomosis was to the posterolateral branch of right coronary.  This was a 1.5-1.7-mm vessel with proximal total occlusion.  A reverse saphenous vein was sewn end-to-side with running 7-0 Prolene with good flow through the graft.  The quality of the coronary arteries was poor as they were somewhat fragile and soft.  The second distal anastomosis was to the obtuse marginal branch of the left coronary.  There was a proximal 95% stenosis.  A reverse saphenous vein was sewn end-to-side with running 7-0 Prolene with good flow through the graft.  Cardioplegia was redosed.  The third distal anastomosis was to the ramus intermedius branch of the left coronary.  This had a proximal 80-90% stenosis.  A reverse saphenous vein was sewn end-to-side to this 1.2-mm vessel with adequate flow.  Cardioplegia was redosed.  The fourth distal anastomosis was to the mid-LAD.  The LAD in this area is 1.2 mm.  The mammary artery free graft was sewn end-to-side with running 8-0 Prolene.  There was good flow through the graft. Cardioplegia was redosed.  While the crossclamp was still in place, three proximal vein anastomoses were performed on the ascending aorta with a 4.5-mm punch and running 6- 0 Prolene.  Prior to removing the crossclamp, air was vented from the coronaries with a dose of retrograde warm blood cardioplegia.  The crossclamp was removed.  The heart resumed a spontaneous rhythm.  The vein grafts were aspirated and each had good flow.  While the patient was being rewarmed, the proximal anastomosis of the free mammary  graft was then sewn end-to-side to the hood of the vein graft of the circumflex using vascular bulldog clamps on the vessels involved.  The bulldogs were removed after completion of the anastomosis, there was good flow through the vessels.  Proximal and distal anastomoses were checked and hemostasis was documented.  Temporary pacing wires were applied.  The lungs were expanded.  When the patient was fully rewarmed and reperfused, he was weaned from cardiopulmonary bypass on low-dose dopamine.  Echocardiogram showed preserved LV function.  Hemodynamics were stable.  Protamine was administered without adverse reaction.  The cannula was removed.  The mediastinum was irrigated with warm saline.  The superior pericardial fat was closed over the aorta.  Anterior mediastinal left pleural chest tube was placed and brought out through separate incisions.  The sternum was closed with the wire and the patient remained hemodynamically stable with cardiac output of over 5 L/minute.  The pectoralis fascia was closed using running #1 Vicryl.  The subcutaneous and skin layers were closed using running Vicryl and sterile dressings were applied.  Total cardiopulmonary  bypass time was 155 minutes.     Adrian PernaPeter Van Trigt, M.D.     PV/MEDQ  D:  12/29/2015  T:  12/29/2015  Job:  [id number]  cc:   Tress[facility] Landry Corporalavid Wayne Harding, MD

## 2015-12-29 NOTE — Procedures (Signed)
Extubation Procedure Note  Patient Details:   Name: Marijo ConceptionLarry J Biswas DOB: 02/16/1951 MRN: [id number]   Airway Documentation:     Evaluation  O2 sats: stable throughout Complications: No apparent complications Patient did tolerate procedure well. Bilateral Breath Sounds: Clear, Diminished Suctioning: Oral, Airway Yes   NIF: -30 cmH2O, VC 0.820 L/min. Pt extubated to 6L [place].  Gaetano HawthorneBrooker, Jessicalynn Deshong 12/29/2015, 11:32 PM

## 2015-12-29 NOTE — Progress Notes (Signed)
The patient was examined and preop studies reviewed. There has been no change from the prior exam and the patient is ready for surgery.   plan CABG on L Adrian Foley

## 2015-12-29 NOTE — Anesthesia Procedure Notes (Signed)
Procedure Name: Intubation Date/Time: 12/29/2015 10:41 AM Performed by: Faustino CongressWHITE, Mickeal Daws TENA Jalaya Sarver Pre-anesthesia Checklist: Patient identified, Emergency Drugs available, Suction available and Patient being monitored Patient Re-evaluated:Patient Re-evaluated prior to inductionOxygen Delivery Method: Circle system utilized Preoxygenation: Pre-oxygenation with 100% oxygen Intubation Type: IV induction Ventilation: Mask ventilation without difficulty Laryngoscope Size: Mac and 4 Grade View: Grade I Tube type: Oral Tube size: 8.5 mm Number of attempts: 1 Airway Equipment and Method: Stylet Placement Confirmation: ETT inserted through vocal cords under direct vision,  positive ETCO2 and breath sounds checked- equal and bilateral Secured at: 23 cm Tube secured with: Tape Dental Injury: Teeth and Oropharynx as per pre-operative assessment

## 2015-12-29 NOTE — Progress Notes (Signed)
Subjective:  Feels well today. Getting cleaned up for surgery today.  Objective:  Vital Signs in the last 24 hours: BP 110/63 mmHg  Pulse 54  Temp(Src) 97.5 F (36.4 C) (Oral)  Resp 18  Ht 5\' 9"  (1.753 m)  Wt 88.587 kg (195 lb 4.8 oz)  BMI 28.83 kg/m2  SpO2 92%  Physical Exam: Obese male in no acute distress Lungs:  Clear  Cardiac:  Regular rhythm, normal S1 and S2, no S3 Extremities:  No edema present  Intake/Output from previous day: 01/08 0701 - 01/09 0700 In: 480 [P.O.:480] Out: 300 [Urine:300] Weight Filed Weights   12/27/15 0535 12/28/15 0400 12/29/15 0500  Weight: 89.54 kg (197 lb 6.4 oz) 89.177 kg (196 lb 9.6 oz) 88.587 kg (195 lb 4.8 oz)    Lab Results: Basic Metabolic Panel:  Recent Labs  16/09/9600/09/17 0653  NA 137  K 4.2  CL 105  CO2 20*  GLUCOSE 140*  BUN 16  CREATININE 0.94    CBC:  Recent Labs  12/28/15 0600 12/29/15 0653  WBC 7.1 6.9  HGB 16.7 17.8*  HCT 48.2 49.9  MCV 88.9 87.7  PLT 196 167    BNP    Component Value Date/Time   BNP 34.9 12/21/2015 0650    PROTIME: Lab Results  Component Value Date   INR 1.07 12/28/2015   INR 1.09 12/21/2015    Telemetry: Normal sinus rhythm with sinus bradycardia  Assessment/Plan:  1.  Severe three-vessel coronary artery disease for coronary  bypass grafting today. 2.  Diabetes mellitus borderline control 3.  Hyperlipidemia under treatment   Kelyn Koskela Swazil[place] MD, [facility] CenterFACC    12/29/2015, 8:53 AM

## 2015-12-29 NOTE — Transfer of Care (Signed)
Immediate Anesthesia Transfer of Care Note  Patient: Adrian ConceptionLarry J Foley  Procedure(s) Performed: Procedure(s): CORONARY ARTERY BYPASS GRAFTING (CABG) x four,  using left internal mammary artery and right leg greater saphenous vein harvested endoscopically (N/A) TRANSESOPHAGEAL ECHOCARDIOGRAM (TEE) (N/A)  Patient Location: SICU  Anesthesia Type:General  Level of Consciousness: Patient remains intubated per anesthesia plan  Airway & Oxygen Therapy: Patient remains intubated per anesthesia plan  Post-op Assessment: Report given to RN and Post -op Vital signs reviewed and stable  Post vital signs: Reviewed and stable  Last Vitals:  Filed Vitals:   12/28/15 2115 12/29/15 0500  BP: 136/74 110/63  Pulse: 55 54  Temp: 36.4 C 36.4 C  Resp:      Complications: No apparent anesthesia complications

## 2015-12-30 ENCOUNTER — Encounter (HOSPITAL_COMMUNITY): Payer: Self-pay | Admitting: Cardiothoracic Surgery

## 2015-12-30 ENCOUNTER — Inpatient Hospital Stay (HOSPITAL_COMMUNITY): Payer: Self-pay

## 2015-12-30 LAB — PREPARE FRESH FROZEN PLASMA
Unit division: 0
Unit division: 0

## 2015-12-30 LAB — CBC
HCT: 37.3 % — ABNORMAL LOW (ref 39.0–52.0)
HCT: 35.3 % — ABNORMAL LOW (ref 39.0–52.0)
HEMOGLOBIN: 12.9 g/dL — AB (ref 13.0–17.0)
HEMOGLOBIN: 12.1 g/dL — AB (ref 13.0–17.0)
MCH: 30.4 pg (ref 26.0–34.0)
MCH: 30.5 pg (ref 26.0–34.0)
MCHC: 34.6 g/dL (ref 30.0–36.0)
MCHC: 34.3 g/dL (ref 30.0–36.0)
MCV: 88 fL (ref 78.0–100.0)
MCV: 88.9 fL (ref 78.0–100.0)
PLATELETS: 169 10*3/uL (ref 150–400)
PLATELETS: 126 10*3/uL — AB (ref 150–400)
RBC: 4.24 MIL/uL (ref 4.22–5.81)
RBC: 3.97 MIL/uL — AB (ref 4.22–5.81)
RDW: 13.2 % (ref 11.5–15.5)
RDW: 13.2 % (ref 11.5–15.5)
WBC: 15.1 10*3/uL — AB (ref 4.0–10.5)
WBC: 11.4 10*3/uL — ABNORMAL HIGH (ref 4.0–10.5)

## 2015-12-30 LAB — BASIC METABOLIC PANEL
Anion gap: 8 (ref 5–15)
BUN: 10 mg/dL (ref 6–20)
CHLORIDE: 106 mmol/L (ref 101–111)
CO2: 23 mmol/L (ref 22–32)
CREATININE: 0.98 mg/dL (ref 0.61–1.24)
Calcium: 8.1 mg/dL — ABNORMAL LOW (ref 8.9–10.3)
Glucose, Bld: 139 mg/dL — ABNORMAL HIGH (ref 65–99)
POTASSIUM: 4.1 mmol/L (ref 3.5–5.1)
SODIUM: 137 mmol/L (ref 135–145)

## 2015-12-30 LAB — GLUCOSE, CAPILLARY
GLUCOSE-CAPILLARY: 106 mg/dL — AB (ref 65–99)
GLUCOSE-CAPILLARY: 114 mg/dL — AB (ref 65–99)
GLUCOSE-CAPILLARY: 117 mg/dL — AB (ref 65–99)
GLUCOSE-CAPILLARY: 138 mg/dL — AB (ref 65–99)
GLUCOSE-CAPILLARY: 142 mg/dL — AB (ref 65–99)
GLUCOSE-CAPILLARY: 145 mg/dL — AB (ref 65–99)
GLUCOSE-CAPILLARY: 145 mg/dL — AB (ref 65–99)
GLUCOSE-CAPILLARY: 154 mg/dL — AB (ref 65–99)
Glucose-Capillary: 103 mg/dL — ABNORMAL HIGH (ref 65–99)
Glucose-Capillary: 138 mg/dL — ABNORMAL HIGH (ref 65–99)
Glucose-Capillary: 133 mg/dL — ABNORMAL HIGH (ref 65–99)
Glucose-Capillary: 112 mg/dL — ABNORMAL HIGH (ref 65–99)
Glucose-Capillary: 99 mg/dL (ref 65–99)
Glucose-Capillary: 116 mg/dL — ABNORMAL HIGH (ref 65–99)
Glucose-Capillary: 169 mg/dL — ABNORMAL HIGH (ref 65–99)
Glucose-Capillary: 172 mg/dL — ABNORMAL HIGH (ref 65–99)
Glucose-Capillary: 145 mg/dL — ABNORMAL HIGH (ref 65–99)

## 2015-12-30 LAB — POCT I-STAT 3, ART BLOOD GAS (G3+)
ACID-BASE DEFICIT: 2 mmol/L (ref 0.0–2.0)
ACID-BASE DEFICIT: 2 mmol/L (ref 0.0–2.0)
BICARBONATE: 22.2 meq/L (ref 20.0–24.0)
Bicarbonate: 22.8 mEq/L (ref 20.0–24.0)
O2 SAT: 93 %
O2 SAT: 94 %
PO2 ART: 69 mmHg — AB (ref 80.0–100.0)
TCO2: 23 mmol/L (ref 0–100)
TCO2: 24 mmol/L (ref 0–100)
pCO2 arterial: 37 mmHg (ref 35.0–45.0)
pCO2 arterial: 38 mmHg (ref 35.0–45.0)
pH, Arterial: 7.376 (ref 7.350–7.450)
pH, Arterial: 7.399 (ref 7.350–7.450)
pO2, Arterial: 72 mmHg — ABNORMAL LOW (ref 80.0–100.0)

## 2015-12-30 LAB — CREATININE, SERUM
Creatinine, Ser: 1 mg/dL (ref 0.61–1.24)
GFR calc Af Amer: >60 mL/min (ref >60)

## 2015-12-30 LAB — MAGNESIUM
MAGNESIUM: 2.5 mg/dL — AB (ref 1.7–2.4)
MAGNESIUM: 2.5 mg/dL — AB (ref 1.7–2.4)

## 2015-12-30 LAB — POCT I-STAT, CHEM 8
BUN: 13 mg/dL (ref 6–20)
CALCIUM ION: 1.2 mmol/L (ref 1.13–1.30)
CHLORIDE: 99 mmol/L — AB (ref 101–111)
Creatinine, Ser: 0.9 mg/dL (ref 0.61–1.24)
Glucose, Bld: 146 mg/dL — ABNORMAL HIGH (ref 65–99)
HCT: 36 % — ABNORMAL LOW (ref 39.0–52.0)
HEMOGLOBIN: 12.2 g/dL — AB (ref 13.0–17.0)
Potassium: 3.9 mmol/L (ref 3.5–5.1)
SODIUM: 136 mmol/L (ref 135–145)
TCO2: 24 mmol/L (ref 0–100)

## 2015-12-30 MED ORDER — KETOROLAC TROMETHAMINE 15 MG/ML IJ SOLN
15.0000 mg | Freq: Four times a day (QID) | INTRAMUSCULAR | Status: AC
  Administered 2015-12-30 – 2015-12-31 (×3): 15 mg via INTRAVENOUS
  Filled 2015-12-30 (×3): qty 1

## 2015-12-30 MED ORDER — MORPHINE SULFATE (PF) 2 MG/ML IV SOLN: 1.0000 mg | INTRAVENOUS | Status: AC | PRN

## 2015-12-30 MED ORDER — INSULIN ASPART 100 UNIT/ML ~~LOC~~ SOLN
0.0000 [IU] | SUBCUTANEOUS | Status: DC
  Administered 2015-12-30 (×2): 2 [IU] via SUBCUTANEOUS
  Administered 2015-12-30: 4 [IU] via SUBCUTANEOUS
  Administered 2015-12-31 (×2): 2 [IU] via SUBCUTANEOUS

## 2015-12-30 MED ORDER — FUROSEMIDE 10 MG/ML IJ SOLN
20.0000 mg | Freq: Two times a day (BID) | INTRAMUSCULAR | Status: DC
  Administered 2015-12-30 – 2015-12-31 (×3): 20 mg via INTRAVENOUS
  Filled 2015-12-30 (×3): qty 2

## 2015-12-30 MED ORDER — INSULIN DETEMIR 100 UNIT/ML ~~LOC~~ SOLN
12.0000 [IU] | Freq: Two times a day (BID) | SUBCUTANEOUS | Status: DC
  Administered 2015-12-30 – 2015-12-31 (×4): 12 [IU] via SUBCUTANEOUS
  Filled 2015-12-30 (×7): qty 0.12

## 2015-12-30 MED ORDER — INSULIN ASPART 100 UNIT/ML ~~LOC~~ SOLN
4.0000 [IU] | Freq: Three times a day (TID) | SUBCUTANEOUS | Status: DC
  Administered 2015-12-30 – 2015-12-31 (×2): 4 [IU] via SUBCUTANEOUS

## 2015-12-30 NOTE — Progress Notes (Signed)
CT surgery p.m. Rounds  Patient examined and record reviewed.Hemodynamics stable,labs satisfactory.Patient had stable day.Continue current care. Adrian Foley 12/30/2015   

## 2015-12-30 NOTE — Progress Notes (Signed)
1 Day Post-Op Procedure(s) (LRB): CORONARY ARTERY BYPASS GRAFTING (CABG) x four,  using left internal mammary artery and right leg greater saphenous vein harvested endoscopically (N/A) TRANSESOPHAGEAL ECHOCARDIOGRAM (TEE) (N/A) Subjective: Stable after CABG for nonstemi, unstable angina Stable hemodynamics, CXR clear Needs 5 L O2 for adequate sats Objective: Vital signs in last 24 hours: Temp:  [96.8 F (36 C)-100 F (37.8 C)] 99.5 F (37.5 C) (01/10 0730) Pulse Rate:  [64-94] 88 (01/10 0730) Cardiac Rhythm:  [-] A-V Sequential paced (01/10 0730) Resp:  [12-35] 18 (01/10 0730) BP: (86-130)/(52-92) 105/58 mmHg (01/10 0700) SpO2:  [92 %-100 %] 98 % (01/10 0730) Arterial Line BP: (81-133)/(49-74) 123/53 mmHg (01/10 0730) FiO2 (%):  [35 %-70 %] 45 % (01/10 0730) Weight:  [203 lb 4.2 oz (92.2 kg)] 203 lb 4.2 oz (92.2 kg) (01/10 0540)  Hemodynamic parameters for last 24 hours: PAP: (20-42)/(4-24) 35/16 mmHg CO:  [3.5 L/min-5.3 L/min] 5.3 L/min CI:  [1.7 L/min/m2-2.6 L/min/m2] 2.6 L/min/m2  Intake/Output from previous day: 01/09 0701 - 01/10 0700 In: 5879.5 [I.V.:3407.5; Blood:792; NG/GT:60; IV Piggyback:1620] Out: 6195 [Urine:4775; Blood:700; Chest Tube:720] Intake/Output this shift:    Alert Lungs cleAR  Lab Results:  Recent Labs  12/29/15 1743 12/29/15 1749 12/30/15 0258  WBC 15.2*  --  15.1*  HGB 15.5 15.6 12.9*  HCT 44.1 46.0 37.3*  PLT 147*  --  169   BMET:  Recent Labs  12/29/15 0653  12/29/15 1613 12/29/15 1749 12/30/15 0258  NA 137  < > 137 138 137  K 4.2  < > 3.8 3.8 4.1  CL 105  < > 99*  --  106  CO2 20*  --   --   --  23  GLUCOSE 140*  < > 170* 160* 139*  BUN 16  < > 14  --  10  CREATININE 0.94  < > 0.80  --  0.98  CALCIUM 9.4  --   --   --  8.1*  < > = values in this interval not displayed.  PT/INR:  Recent Labs  12/29/15 1743  LABPROT 17.2*  INR 1.39   ABG    Component Value Date/Time   PHART 7.399 12/30/2015 0024   HCO3 22.8  12/30/2015 0024   TCO2 24 12/30/2015 0024   ACIDBASEDEF 2.0 12/30/2015 0024   O2SAT 94.0 12/30/2015 0024   CBG (last 3)   Recent Labs  12/28/15 1649 12/28/15 2121 12/29/15 0722  GLUCAP 150* 114* 138*    Assessment/Plan: S/P Procedure(s) (LRB): CORONARY ARTERY BYPASS GRAFTING (CABG) x four,  using left internal mammary artery and right leg greater saphenous vein harvested endoscopically (N/A) TRANSESOPHAGEAL ECHOCARDIOGRAM (TEE) (N/A) Mobilize Diuresis Diabetes control d/c tubes/lines leave chest tubes until later Wean O2 as tolerated  LOS: 9 days    Kathlee Nationseter Van Trigt III 12/30/2015

## 2015-12-30 NOTE — Progress Notes (Signed)
Pt reported dizziness when dangling on side of bed around noon. After sitting for ten minutes, still reported some lightheadedness (though less than before), so helped pt back to bed instead of getting up to the chair. CT drained 40cc with dangle.  Will try again later this afternoon.  Peyton Bottomsachel R Dai Apel, RN 1:54 PM 12/30/2015

## 2015-12-31 ENCOUNTER — Inpatient Hospital Stay (HOSPITAL_COMMUNITY): Payer: Self-pay

## 2015-12-31 LAB — TYPE AND SCREEN
ABO/RH(D): O POS
Antibody Screen: NEGATIVE
Unit division: 0
Unit division: 0

## 2015-12-31 LAB — BASIC METABOLIC PANEL
Anion gap: 6 (ref 5–15)
BUN: 17 mg/dL (ref 6–20)
CO2: 27 mmol/L (ref 22–32)
Calcium: 8.3 mg/dL — ABNORMAL LOW (ref 8.9–10.3)
Chloride: 103 mmol/L (ref 101–111)
Creatinine, Ser: 1.1 mg/dL (ref 0.61–1.24)
GFR calc Af Amer: >60 mL/min (ref >60)
GFR calc non Af Amer: >60 mL/min (ref >60)
Glucose, Bld: 136 mg/dL — ABNORMAL HIGH (ref 65–99)
Potassium: 3.9 mmol/L (ref 3.5–5.1)
Sodium: 136 mmol/L (ref 135–145)

## 2015-12-31 LAB — CBC
HCT: 34.9 % — ABNORMAL LOW (ref 39.0–52.0)
Hemoglobin: 11.7 g/dL — ABNORMAL LOW (ref 13.0–17.0)
MCH: 30.1 pg (ref 26.0–34.0)
MCHC: 33.5 g/dL (ref 30.0–36.0)
MCV: 89.7 fL (ref 78.0–100.0)
Platelets: 133 10*3/uL — ABNORMAL LOW (ref 150–400)
RBC: 3.89 MIL/uL — ABNORMAL LOW (ref 4.22–5.81)
RDW: 13.3 % (ref 11.5–15.5)
WBC: 10.6 10*3/uL — ABNORMAL HIGH (ref 4.0–10.5)

## 2015-12-31 LAB — GLUCOSE, CAPILLARY
GLUCOSE-CAPILLARY: 102 mg/dL — AB (ref 65–99)
GLUCOSE-CAPILLARY: 118 mg/dL — AB (ref 65–99)
GLUCOSE-CAPILLARY: 125 mg/dL — AB (ref 65–99)
GLUCOSE-CAPILLARY: 154 mg/dL — AB (ref 65–99)
Glucose-Capillary: 121 mg/dL — ABNORMAL HIGH (ref 65–99)
Glucose-Capillary: 113 mg/dL — ABNORMAL HIGH (ref 65–99)
Glucose-Capillary: 147 mg/dL — ABNORMAL HIGH (ref 65–99)

## 2015-12-31 MED ORDER — MAGNESIUM HYDROXIDE 400 MG/5ML PO SUSP: 30.0000 mL | Freq: Every day | ORAL | Status: DC | PRN

## 2015-12-31 MED ORDER — SODIUM CHLORIDE 0.9 % IJ SOLN: 3.0000 mL | INTRAMUSCULAR | Status: DC | PRN

## 2015-12-31 MED ORDER — POTASSIUM CHLORIDE CRYS ER 20 MEQ PO TBCR
20.0000 meq | EXTENDED_RELEASE_TABLET | Freq: Every day | ORAL | Status: DC
Start: 2015-12-31 — End: 2016-01-03
  Administered 2015-12-31 – 2016-01-03 (×4): 20 meq via ORAL
  Filled 2015-12-31 (×4): qty 1

## 2015-12-31 MED ORDER — MOVING RIGHT ALONG BOOK
Freq: Once | Status: AC
  Administered 2015-12-31: 10:00:00
  Filled 2015-12-31: qty 1

## 2015-12-31 MED ORDER — FUROSEMIDE 40 MG PO TABS
40.0000 mg | ORAL_TABLET | Freq: Every day | ORAL | Status: DC
  Administered 2015-12-31 – 2016-01-03 (×4): 40 mg via ORAL
  Filled 2015-12-31 (×4): qty 1

## 2015-12-31 MED ORDER — SODIUM CHLORIDE 0.9 % IV SOLN: 250.0000 mL | INTRAVENOUS | Status: DC | PRN

## 2015-12-31 MED ORDER — SODIUM CHLORIDE 0.9 % IJ SOLN: 3.0000 mL | Freq: Two times a day (BID) | INTRAMUSCULAR | Status: DC

## 2015-12-31 MED ORDER — INSULIN ASPART 100 UNIT/ML ~~LOC~~ SOLN
0.0000 [IU] | Freq: Three times a day (TID) | SUBCUTANEOUS | Status: DC
  Administered 2015-12-31 – 2016-01-03 (×6): 2 [IU] via SUBCUTANEOUS

## 2015-12-31 MED FILL — Heparin Sodium (Porcine) Inj 1000 Unit/ML: INTRAMUSCULAR | Qty: 30 | Status: AC

## 2015-12-31 MED FILL — Magnesium Sulfate Inj 50%: INTRAMUSCULAR | Qty: 10 | Status: AC

## 2015-12-31 MED FILL — Potassium Chloride Inj 2 mEq/ML: INTRAVENOUS | Qty: 40 | Status: AC

## 2015-12-31 MED FILL — Sodium Chloride IV Soln 0.9%: INTRAVENOUS | Qty: 2000 | Status: AC

## 2015-12-31 MED FILL — Mannitol IV Soln 20%: INTRAVENOUS | Qty: 500 | Status: AC

## 2015-12-31 MED FILL — Lidocaine HCl IV Inj 20 MG/ML: INTRAVENOUS | Qty: 5 | Status: AC

## 2015-12-31 MED FILL — Sodium Bicarbonate IV Soln 8.4%: INTRAVENOUS | Qty: 50 | Status: AC

## 2015-12-31 MED FILL — Heparin Sodium (Porcine) Inj 1000 Unit/ML: INTRAMUSCULAR | Qty: 60 | Status: AC

## 2015-12-31 MED FILL — Electrolyte-R (PH 7.4) Solution: INTRAVENOUS | Qty: 4000 | Status: AC

## 2015-12-31 NOTE — Progress Notes (Signed)
Patient doing very well post CABG. No complications. He presented with a NSTEMI. Would consider adding Plavix when ok with surgery.  Deyanira Fesler Sw[place]il[place]Jordan MD, [facility] CenterFACC 12/31/2015 11:41 AM

## 2015-12-31 NOTE — Progress Notes (Deleted)
12/31/2015 09:15 PM Pt went into AFib suddenly with heart rate going from 110s to high as 160s.  BP was 150/89.  Pt was feeling fine except for feeling palpitations.  Dr. Laneta SimmersBartle was paged and ordered to give 5 mg Metoprolol IV once. If the pt was still in AFib after 30 minutes give a second Metoprolol 5 mg IV.  Will continue to monitor pt. Harriet Massonavidson, Anamaria Dusenbury E, RN

## 2015-12-31 NOTE — Progress Notes (Signed)
Patient ID: Marijo ConceptionLarry J Merriweather, male   DOB: 02/18/1951, 65 y.o.   MRN: [id number]  SICU Evening Rounds:  Hemodynamically stable in sinus rhythm  Diuresing well  Ambulated well  Awaiting bed on 2W.

## 2015-12-31 NOTE — Progress Notes (Signed)
2 Days Post-Op Procedure(s) (LRB): CORONARY ARTERY BYPASS GRAFTING (CABG) x four,  using left internal mammary artery and right leg greater saphenous vein harvested endoscopically (N/A) TRANSESOPHAGEAL ECHOCARDIOGRAM (TEE) (N/A) Subjective: Progressing after CABG for ACS/ SEMI nsr Objective: Vital signs in last 24 hours: Temp:  [98.3 F (36.8 C)-99.3 F (37.4 C)] 98.3 F (36.8 C) (01/11 0700) Pulse Rate:  [58-76] 65 (01/11 0700) Cardiac Rhythm:  [-] Normal sinus rhythm (01/11 0700) Resp:  [13-25] 15 (01/11 0700) BP: (91-119)/(57-67) 98/61 mmHg (01/11 0700) SpO2:  [91 %-100 %] 97 % (01/11 0700) Arterial Line BP: (116-158)/(48-69) 137/60 mmHg (01/10 1600) Weight:  [196 lb 6.4 oz (89.086 kg)] 196 lb 6.4 oz (89.086 kg) (01/11 0625)  Hemodynamic parameters for last 24 hours:  stable  Intake/Output from previous day: 01/10 0701 - 01/11 0700 In: 1782.6 [P.O.:320; I.V.:912.6; IV Piggyback:550] Out: 2005 [Urine:1715; Chest Tube:290] Intake/Output this shift: Total I/O In: -  Out: 55 [Urine:55]  nsr Tubes out  Lab Results:  Recent Labs  12/30/15 1603 12/30/15 1609 12/31/15 0421  WBC 11.4*  --  10.6*  HGB 12.1* 12.2* 11.7*  HCT 35.3* 36.0* 34.9*  PLT 126*  --  133*   BMET:  Recent Labs  12/30/15 0258  12/30/15 1609 12/31/15 0421  NA 137  --  136 136  K 4.1  --  3.9 3.9  CL 106  --  99* 103  CO2 23  --   --  27  GLUCOSE 139*  --  146* 136*  BUN 10  --  13 17  CREATININE 0.98  < > 0.90 1.10  CALCIUM 8.1*  --   --  8.3*  < > = values in this interval not displayed.  PT/INR:  Recent Labs  12/29/15 1743  LABPROT 17.2*  INR 1.39   ABG    Component Value Date/Time   PHART 7.399 12/30/2015 0024   HCO3 22.8 12/30/2015 0024   TCO2 24 12/30/2015 1609   ACIDBASEDEF 2.0 12/30/2015 0024   O2SAT 94.0 12/30/2015 0024   CBG (last 3)   Recent Labs  12/30/15 2357 12/31/15 0350 12/31/15 0849  GLUCAP 118* 121* 125*    Assessment/Plan: S/P Procedure(s)  (LRB): CORONARY ARTERY BYPASS GRAFTING (CABG) x four,  using left internal mammary artery and right leg greater saphenous vein harvested endoscopically (N/A) TRANSESOPHAGEAL ECHOCARDIOGRAM (TEE) (N/A) Mobilize Diuresis Diabetes control Plan for transfer to step-down: see transfer orders   LOS: 10 days    Kathlee Nationseter Van Trigt III 12/31/2015

## 2015-12-31 NOTE — Progress Notes (Deleted)
12/31/2015 10:34 PM Pt's heart rhythm was still irregular but his rate came down to 90s and low 100s after 30 minutes from the first IV metoprolol 5 mg. The pt's BP dropped down to 101/59. Dr. Bartle was informed and he ordered to not give a second dose of metoprolol and to just continue watching the pt's heart rate and rhythm. Will continue to monitor pt. Jadine Brumley E, RN 

## 2016-01-01 ENCOUNTER — Inpatient Hospital Stay (HOSPITAL_COMMUNITY): Payer: Self-pay

## 2016-01-01 LAB — CBC
HCT: 35.4 % — ABNORMAL LOW (ref 39.0–52.0)
Hemoglobin: 11.8 g/dL — ABNORMAL LOW (ref 13.0–17.0)
MCH: 29.6 pg (ref 26.0–34.0)
MCHC: 33.3 g/dL (ref 30.0–36.0)
MCV: 88.9 fL (ref 78.0–100.0)
Platelets: 150 10*3/uL (ref 150–400)
RBC: 3.98 MIL/uL — ABNORMAL LOW (ref 4.22–5.81)
RDW: 13 % (ref 11.5–15.5)
WBC: 10 10*3/uL (ref 4.0–10.5)

## 2016-01-01 LAB — BASIC METABOLIC PANEL
Anion gap: 9 (ref 5–15)
BUN: 17 mg/dL (ref 6–20)
CO2: 25 mmol/L (ref 22–32)
Calcium: 8.4 mg/dL — ABNORMAL LOW (ref 8.9–10.3)
Chloride: 106 mmol/L (ref 101–111)
Creatinine, Ser: 1.01 mg/dL (ref 0.61–1.24)
GFR calc Af Amer: >60 mL/min (ref >60)
GFR calc non Af Amer: >60 mL/min (ref >60)
Glucose, Bld: 122 mg/dL — ABNORMAL HIGH (ref 65–99)
Potassium: 4.2 mmol/L (ref 3.5–5.1)
Sodium: 140 mmol/L (ref 135–145)

## 2016-01-01 LAB — GLUCOSE, CAPILLARY
GLUCOSE-CAPILLARY: 113 mg/dL — AB (ref 65–99)
GLUCOSE-CAPILLARY: 94 mg/dL (ref 65–99)
GLUCOSE-CAPILLARY: 130 mg/dL — AB (ref 65–99)
Glucose-Capillary: 149 mg/dL — ABNORMAL HIGH (ref 65–99)

## 2016-01-01 MED ORDER — SIMVASTATIN 20 MG PO TABS
20.0000 mg | ORAL_TABLET | Freq: Every day | ORAL | Status: DC
  Administered 2016-01-01 – 2016-01-02 (×2): 20 mg via ORAL
  Filled 2016-01-01 (×2): qty 1

## 2016-01-01 MED ORDER — METFORMIN HCL 500 MG PO TABS
500.0000 mg | ORAL_TABLET | Freq: Two times a day (BID) | ORAL | Status: DC
  Administered 2016-01-01 – 2016-01-03 (×4): 500 mg via ORAL
  Filled 2016-01-01 (×4): qty 1

## 2016-01-01 NOTE — Progress Notes (Signed)
Utilization review completed.  

## 2016-01-01 NOTE — Care Management Note (Signed)
Case Management Note Previous CM note initiated by Tomi BambergerBrenda Graves-Bigelow RN, CM  Patient Details  Name: Marijo ConceptionLarry J Blickenstaff MRN: [id number] Date of Birth: 12/23/1950  Subjective/Objective:Pt admitted for chest pain-Hx CAD. Plan for Cardiac Cath 12-23-15.            Action/Plan:CM did speak with pt in regards to disposition needs. Pt has PCP and [place] [facility] [place]. CM will continue to monitor for medication needs post cath.     Expected Discharge Date:                  Expected Discharge Plan:  Home/Self Care  In-House Referral:  NA  Discharge planning Services  CM Consult, Medication Assistance  Post Acute Care Choice:    Choice offered to:  NA  DME Arranged:  N/A DME Agency:  NA  HH Arranged:  NA HH Agency:  NA  Status of Service:  In process, will continue to follow  Medicare Important Message Given:    Date Medicare IM Given:    Medicare IM give by:    Date Additional Medicare IM Given:    Additional Medicare Important Message give by:     If discussed at Long Length of Stay Meetings, dates discussed: 12-25-15  , 12/31/15  Additional Comments:   01/01/16- 1000- Donn PieriniKristi Dammon Makarewicz RN, BSN - pt s/p CABG 1/9- tx from ICU on 12/31/15- still has EPW- NCM to follow for d/c needs- pt self pay, has PCP, and uses [facility].   1221 12-25-15 Tomi BambergerBrenda Graves-Bigelow, RN,BSN [phone] Pt was discussed in the [facility] meting. Plan for CABG on Monday. CM will continue to monitor for additional disposition needs.    Darrold SpanWebster, Tricia Pledger Hall, RN 01/01/2016, 10:02 AM

## 2016-01-01 NOTE — Discharge Instructions (Signed)
° ° °Activity: 1.May walk up steps °               2.No lifting more than ten pounds for four weeks.  °               3.No driving for four weeks. °               4.Stop any activity that causes chest pain, shortness of breath, dizziness, sweating or excessive weakness. °               5.Avoid straining. °               6.Continue with your breathing exercises daily. ° °Diet: Diabetic diet and Low fat, Low salt diet ° °Wound Care: May shower.  Clean wounds with mild soap and water daily. Contact the office at [phone] if any problems arise. ° °Coronary Artery Bypass Grafting, Care After °Refer to this sheet in the next few weeks. These instructions provide you with information on caring for yourself after your procedure. Your health care provider may also give you more specific instructions. Your treatment has been planned according to current medical practices, but problems sometimes occur. Call your health care provider if you have any problems or questions after your procedure. °WHAT TO EXPECT AFTER THE PROCEDURE °Recovery from surgery will be different for everyone. Some people feel well after 3 or 4 weeks, while for others it takes longer. After your procedure, it is typical to have the following: °· Nausea and a lack of appetite.   °· Constipation. °· Weakness and fatigue.   °· Depression or irritability.   °· Pain or discomfort at your incision site. °HOME CARE INSTRUCTIONS °· Take medicines only as directed by your health care provider. Do not stop taking medicines or start any new medicines without first checking with your health care provider. °· Take your pulse as directed by your health care provider. °· Perform deep breathing as directed by your health care provider. If you were given a device called an incentive spirometer, use it to practice deep breathing several times a day. Support your chest with a pillow or your arms when you take deep breaths or cough. °· Keep incision areas clean, dry, and  protected. Remove or change any bandages (dressings) only as directed by your health care provider. You may have skin adhesive strips over the incision areas. Do not take the strips off. They will fall off on their own. °· Check incision areas daily for any swelling, redness, or drainage. °· If incisions were made in your legs, do the following: °¨ Avoid crossing your legs.   °¨ Avoid sitting for long periods of time. Change positions every 30 minutes.   °¨ Elevate your legs when you are sitting. °· Wear compression stockings as directed by your health care provider. These stockings help keep blood clots from forming in your legs. °· Take showers once your health care provider approves. Until then, only take sponge baths. Pat incisions dry. Do not rub incisions with a washcloth or towel. Do not take baths, swim, or use a hot tub until your health care provider approves. °· Eat foods that are high in fiber, such as raw fruits and vegetables, whole grains, beans, and nuts. Meats should be lean cut. Avoid canned, processed, and fried foods. °· Drink enough fluid to keep your urine clear or pale yellow. °· Weigh yourself every day. This helps identify if you are retaining fluid that may make your heart   and lungs work harder. °· Rest and limit activity as directed by your health care provider. You may be instructed to: °¨ Stop any activity at once if you have chest pain, shortness of breath, irregular heartbeats, or dizziness. Get help right away if you have any of these symptoms. °¨ Move around frequently for short periods or take short walks as directed by your health care provider. Increase your activities gradually. You may need physical therapy or cardiac rehabilitation to help strengthen your muscles and build your endurance. °¨ Avoid lifting, pushing, or pulling anything heavier than 10 lb (4.5 kg) for at least 6 weeks after surgery. °· Do not drive until your health care provider approves.  °· Ask your health  care provider when you may return to work. °· Ask your health care provider when you may resume sexual activity. °· Keep all follow-up visits as directed by your health care provider. This is important. °SEEK MEDICAL CARE IF: °· You have swelling, redness, increasing pain, or drainage at the site of an incision. °· You have a fever. °· You have swelling in your ankles or legs. °· You have pain in your legs.   °· You gain 2 or more pounds (0.9 kg) a day. °· You are nauseous or vomit. °· You have diarrhea.  °SEEK IMMEDIATE MEDICAL CARE IF: °· You have chest pain that goes to your jaw or arms. °· You have shortness of breath.   °· You have a fast or irregular heartbeat.   °· You notice a "clicking" in your breastbone (sternum) when you move.   °· You have numbness or weakness in your arms or legs. °· You feel dizzy or light-headed.   °MAKE SURE YOU: °· Understand these instructions. °· Will watch your condition. °· Will get help right away if you are not doing well or get worse. °  °This information is not intended to replace advice given to you by your health care provider. Make sure you discuss any questions you have with your health care provider. °  °Document Released: 06/25/2005 Document Revised: 12/27/2014 Document Reviewed: 05/15/2013 °Elsevier Interactive Patient Education ©2016 [employer] ° °

## 2016-01-01 NOTE — Progress Notes (Signed)
CARDIAC REHAB PHASE I   PRE:  Rate/Rhythm: 80 SR    BP: sitting 107/67    SaO2: 92 RA  MODE:  Ambulation: 700 ft   POST:  Rate/Rhythm: 90 SR    BP: sitting 119/70     SaO2: 98 RA  Pt able to walk without RW, supervision assist. Moving well, steady. Somewhat SOB but pt did not complain. VSS. Return to recliner. Left diet sheets for pt to start reading. Pt can walk independently. [phone]   Elissa LovettReeve, Dinnis Rog PlainviewKristan CES, ACSM 01/01/2016 10:30 AM

## 2016-01-01 NOTE — Progress Notes (Addendum)
      [address]       [place] [id number]             [phone]        3 Days Post-Op Procedure(s) (LRB): CORONARY ARTERY BYPASS GRAFTING (CABG) x four,  using left internal mammary artery and right leg greater saphenous vein harvested endoscopically (N/A) TRANSESOPHAGEAL ECHOCARDIOGRAM (TEE) (N/A)  Subjective: Patient has incisional pain with cough. Passing flatus but no bowel movement.  Objective: Vital signs in last 24 hours: Temp:  [97.9 F (36.6 C)-99.5 F (37.5 C)] 97.9 F (36.6 C) (01/12 0456) Pulse Rate:  [63-79] 63 (01/12 0456) Cardiac Rhythm:  [-] Normal sinus rhythm (01/11 2046) Resp:  [14-24] 16 (01/12 0456) BP: (91-124)/(59-71) 104/62 mmHg (01/12 0456) SpO2:  [90 %-100 %] 92 % (01/12 0456) Weight:  [196 lb 6.9 oz (89.1 kg)] 196 lb 6.9 oz (89.1 kg) (01/12 0500)  Pre op weight 88 kg Current Weight  01/01/16 196 lb 6.9 oz (89.1 kg)      Intake/Output from previous day: 01/11 0701 - 01/12 0700 In: 520 [P.O.:480; I.V.:40] Out: 1465 [Urine:1465]   Physical Exam:  Cardiovascular: RRR. Pulmonary: Slightly diminished at bases; no rales, wheezes, or rhonchi. Abdomen: Soft, non tender, bowel sounds present. Extremities: Mild bilateral lower extremity edema. Wounds: Clean and dry.  No erythema or signs of infection.  Lab Results: CBC: Recent Labs  12/31/15 0421 01/01/16 0345  WBC 10.6* 10.0  HGB 11.7* 11.8*  HCT 34.9* 35.4*  PLT 133* 150   BMET:  Recent Labs  12/31/15 0421 01/01/16 0345  NA 136 140  K 3.9 4.2  CL 103 106  CO2 27 25  GLUCOSE 136* 122*  BUN 17 17  CREATININE 1.10 1.01  CALCIUM 8.3* 8.4*    PT/INR:  Lab Results  Component Value Date   INR 1.39 12/29/2015   INR 1.07 12/28/2015   INR 1.09 12/21/2015   ABG:  INR: Will add last result for INR, ABG once components are confirmed Will add last 4 CBG results once components are confirmed  Assessment/Plan:  1. CV - S/p NSTEMI. SR in the 60's this am.  On Lopressor 12.5 mg bid. BP labile so unable to start ACE/ARB at this time. 2.  Pulmonary - On room air. CXR this am shows no pneumothorax, left base atelectasis and small pleural effusion. Encourage incentive spirometer 3. Volume Overload - On Lasix 40 mg daily 4.  Acute blood loss anemia - H and H stable at 11.8 and 35.4 5. Remove EPW in am 6. DM-CBGs 154/147/113. Pre op HGA1C 6.5. He is a new diabetic. On Insulin. Will start Metformin and stop scheduled Insulin. He will need follow up with medical doctor after discharge 7. LOC in am if no bowel movement, per patient request 8. He is not on a statin so will start Zocor  ZIMMERMAN,DONIELLE MPA-C 01/01/2016,8:04 AM  Doing well DC EPWS tomorrow DC home Sat  patient examined and medical record reviewed,agree with above note. Kathlee Nationseter Van Trigt III 01/01/2016

## 2016-01-01 NOTE — Discharge Summary (Signed)
Physician Discharge Summary       [address]       [place] [address]             [phone]    Patient ID: Adrian Foley MRN: [id number] DOB/AGE: Feb 19, 1951 65 y.o.  Admit date: 12/21/2015 Discharge date: 01/03/2016  Principle Diagnoses: 1. S/p NSTEMI (HCC) 2. CAD  Discharge Diagnoses:  1.Hyperlipidemia 2. Type 2 diabetes mellitus with vascular disease (HCC) 3. Tobacco abuse 4. ABL anemia   Procedure (s):  Cardiac catheterization done by Dr. Duke Salvia on 12/23/2015: 1. Ost RCA to Dist RCA lesion, 100% stenosed. Dist RCA lesion, 99% stenosed. Ost RPDA lesion, 100% stenosed. Post Atrio lesion, 90% stenosed. 2. Prox LAD to Mid LAD lesion, 95% stenosed. The lesion was previously treated with a bare metal stent greater than two years ago. Severe IN-Stent Restenosis 3. Dist LAD lesion, 100% stenosed beond D2. 2nd Diag lesion, 70% stenosed. 4. 2nd Mrg lesion, 65% stenosed. 5. The left ventricular systolic function is normal.   Severe multivessel disease with the best option being bypass surgery. The LAD has extensive stent with diffuse in-stent restenosis and < TIMI 1 flow distally. There are some left to right collaterals filling the distal LAD which then fills the PDA with collaterals. The RCA is totally occluded with minimal retrograde filling beyond the bifurcation. There is extensive posterolateral branch that has collaterals, but no retrograde filling of the PDA.  Plan:  Return to nursing unit or TR band removal.   CT surgical consultation has been called  Restart heparin 6 hours posterior TR band removal.  Continue nitroglycerin infusion.  Continue risk factor modification.  Bed rest until CABG    HARDING, Piedad Climes, M.D., M.S. Interventional Cardiologist     Coronary artery bypass grafting x4 (left internal mammary artery to left anterior descending artery as a free graft, saphenous vein graft to ramus intermedius, saphenous vein graft to  obtuse marginal, saphenous vein graft to posterolateral branch of the right coronary). 2. Endoscopic harvest of right leg greater saphenous vein by Dr. Donata Clay on 12/29/2015.  History of Presenting Illness: This is a  65 year old Caucasian male ex-smoker with history of PCI 17 years ago to the LAD who presented with unstable angina and mildly elevated cardiac enzymes. His  Initial troponin I was 0.4. And peaked at 0.36. He was put on  on IV heparin and IV nitroglycerin. Echo showed LVEF to be 55-60%, trivial MR and TR, and no AS or AI. Cardiac catheterization showed severe in-stent stenosis of the LAD with occlusion of the distal vessel, occlusion of the right coronary, and high-grade stenosis of the circumflex system. LVEF is preserved at 55%. LVEDP is 10 mmHg. Multivessel CABG has been recommended as his best long-term therapy. A consult was obtained with Dr. Donata Clay. Potential risks, benefits, and complications of the surgery were discussed with the patient and he agreed to proceed with surgery. Pre op carotid duplex US showed no significant internal carotid artery stenosis. He underwent a CABG x 4 on 12/29/2015.  Brief Hospital Course:  The patient was extubated late the evening of surgery without difficulty. He remained afebrile and hemodynamically stable. Theone Murdoch, a line, chest tubes, and foley were removed early in the post operative course. Lopressor was started and titrated accordingly. He was volume over loaded and diuresed. He had ABL anemia. He did not require a post op transfusion. His last H and H was 11.8 and 35.4. He was weaned off  the insulin drip.  The patient's HGA1C pre op was 6.5. He is a new diabetic. He was started on Metformin 500 mg bid. He will need close medical follow up after discharge.The patient's glucose remained well controlled.The patient was felt surgically stable for transfer from the ICU to PCTU for further convalescence on 12/31/2015. He continues to progress with  cardiac rehab. He was ambulating on room air. He has been tolerating a diet and has had a bowel movement. Epicardial pacing wires were removed on 01/13. Chest tube sutures will be removed prior to discharge. The patient is felt surgically stable for discharge today.   Latest Vital Signs: Blood pressure 111/62, pulse 72, temperature 98.1 F (36.7 C), temperature source Oral, resp. rate 18, height 5\' 9"  (1.753 m), weight 192 lb 0.3 oz (87.1 kg), SpO2 94 %.  Physical Exam: Cardiovascular: RRR. Pulmonary: Slightly diminished at bases; no rales, wheezes, or rhonchi. Abdomen: Soft, non tender, bowel sounds present. Extremities: Mild bilateral lower extremity edema. Wounds: Clean and dry. No erythema or signs of infection.  Discharge Condition:Stable and discharged to home  Recent laboratory studies:  Lab Results  Component Value Date   WBC 10.0 01/01/2016   HGB 11.8* 01/01/2016   HCT 35.4* 01/01/2016   MCV 88.9 01/01/2016   PLT 150 01/01/2016   Lab Results  Component Value Date   NA 140 01/01/2016   K 4.2 01/01/2016   CL 106 01/01/2016   CO2 25 01/01/2016   CREATININE 1.01 01/01/2016   GLUCOSE 122* 01/01/2016    Diagnostic Studies: Dg Chest 2 View  01/01/2016  CLINICAL DATA:  65 year old male status post CABG. EXAM: CHEST  2 VIEW COMPARISON:  Chest x-ray 12/31/2015. FINDINGS: Previously noted right IJ Cordis has been removed. Lung volumes appear increased, and there is decreasing opacity in the lower left hemithorax, compatible with a resolving postoperative pleural effusion (now small) and resolving postoperative atelectasis in the left lower lobe. No pneumothorax. No acute consolidative airspace disease. No evidence of pulmonary edema. Cardiopericardial silhouette is within normal limits. Upper mediastinal contours are normal. Atherosclerosis in the thoracic aorta. Status post median sternotomy for CABG. IMPRESSION: 1. Support apparatus and postoperative changes, as above. 2.  Increasing lung volumes with resolving postoperative atelectasis in the left lower lobe and decreasing small left pleural effusion. Electronically Signed   By: Trudie Reedaniel  Entrikin M.D.   On: 01/01/2016 08:02    Ct Chest Wo Contrast  12/27/2015  CLINICAL DATA:  Severe coronary artery disease. Un stable angina. Elevated troponin. CABG scheduled for Monday. EXAM: CT CHEST WITHOUT CONTRAST TECHNIQUE: Multidetector CT imaging of the chest was performed following the standard protocol without IV contrast. COMPARISON:  None. FINDINGS: There is no evidence of abnormal mediastinal adenopathy. Mild 3 vessel coronary artery calcifications. LAD stent is noted. The heart is not dilated. No pericardial effusion. Little if any atherosclerotic calcification of the aortic arch. Aorta is normal in caliber. Maximal ascending aortic diameter is 3.6 cm. No pneumothorax or pleural effusion Lungs are clear.  There is no evidence of lung mass. No acute bony deformity or pathology. IMPRESSION: No acute cardiopulmonary disease. Electronically Signed   By: Jolaine ClickArthur  Hoss M.D.   On: 12/27/2015 09:25      Discharge Instructions    Amb Referral to Cardiac Rehabilitation    Complete by:  As directed   Diagnosis:  CABG          Discharge Medications:   Medication List    STOP taking these medications  ibuprofen 800 MG tablet  Commonly known as:  ADVIL,MOTRIN     nitroGLYCERIN 0.4 MG SL tablet  Commonly known as:  NITROSTAT      TAKE these medications        aspirin 325 MG tablet  Take 325 mg by mouth daily.     furosemide 40 MG tablet  Commonly known as:  LASIX  Take 1 tablet (40 mg total) by mouth daily. For 7 Days     metFORMIN 500 MG tablet  Commonly known as:  GLUCOPHAGE  Take 1 tablet (500 mg total) by mouth 2 (two) times daily with a meal.     metoprolol tartrate 25 MG tablet  Commonly known as:  LOPRESSOR  Take 0.5 tablets (12.5 mg total) by mouth 2 (two) times daily.     oxyCODONE 5 MG immediate  release tablet  Commonly known as:  Oxy IR/ROXICODONE  Take 1-2 tablets (5-10 mg total) by mouth every 3 (three) hours as needed for severe pain.     potassium chloride SA 20 MEQ tablet  Commonly known as:  K-DUR,KLOR-CON  Take 1 tablet (20 mEq total) by mouth daily. For 7 Days     simvastatin 20 MG tablet  Commonly known as:  ZOCOR  Take 1 tablet (20 mg total) by mouth daily at 6 PM.     traMADol 50 MG tablet  Commonly known as:  ULTRAM  Take 1-2 tablets (50-100 mg total) by mouth every 4 (four) hours as needed for moderate pain.       The patient has been discharged on:   1.Beta Blocker:  Yes [ x  ]                              No   [   ]                              If No, reason:  2.Ace Inhibitor/ARB: Yes [   ]                                     No  [ x   ]                                     If No, reason: Labile BP  3.Statin:   Yes [  x ]                  No  [   ]                  If No, reason:  4.Ecasa:  Yes  [ x  ]                  No   [   ]                  If No, reason:  Follow Up Appointments: Follow-up Information    Follow up with Madilyn Hook, MD.   Specialty:  Cardiology   Why:  27th of January at 2:45pm   Contact information:   [address] [place] [place] [address] [place] [address] [phone]       Follow up  with Judie Petit, MD.   Specialties:  Internal Medicine, Radiology   Why:  Call for a follow up appointment regarding further surveillance of HGA1C 6.5 and diabetes management   Contact information:   [address] [facility] [place] [address] [phone]       Follow up with [name] III, MD On 02/04/2016.   Specialty:  Cardiothoracic Surgery   Why:  PA/LAT CXR to be taken (at [facility] which is in the same building as Dr. Zenaida Niece Trig'ts office) on 02/04/2016 at 9:15 am;Appointment time is at 10:00 am   Contact information:   [address] [place] [address] [phone]        Signed: Marrion Coy 01/03/2016, 8:38 AM

## 2016-01-02 LAB — GLUCOSE, CAPILLARY
GLUCOSE-CAPILLARY: 139 mg/dL — AB (ref 65–99)
GLUCOSE-CAPILLARY: 115 mg/dL — AB (ref 65–99)
Glucose-Capillary: 119 mg/dL — ABNORMAL HIGH (ref 65–99)
Glucose-Capillary: 129 mg/dL — ABNORMAL HIGH (ref 65–99)

## 2016-01-02 NOTE — Care Management Note (Signed)
Case Management Note Previous CM note initiated by Jacqlyn Krauss RN, CM  Patient Details  Name: Adrian Foley MRN: [id number] Date of Birth: 1951/07/04  Subjective/Objective:Pt admitted for chest pain-Hx CAD. Plan for Cardiac Cath 12-23-15.            Action/Plan:CM did speak with pt in regards to disposition needs. Pt has PCP [facility]. CM will continue to monitor for medication needs post cath.     Expected Discharge Date:                  Expected Discharge Plan:  Home/Self Care  In-House Referral:  NA  Discharge planning Services  CM Consult, Medication Assistance  Post Acute Care Choice:    Choice offered to:  NA  DME Arranged:  N/A DME Agency:  NA  HH Arranged:  NA HH Agency:  NA  Status of Service:  In process, will continue to follow  Medicare Important Message Given:    Date Medicare IM Given:    Medicare IM give by:    Date Additional Medicare IM Given:    Additional Medicare Important Message give by:     If discussed at [place] of Stay Meetings, dates discussed: 12-25-15  , 12/31/15  Additional Comments:  01/02/2016 EPW removed today.  CM requested pharmacy to review pts chart to determine if new or expensive medications could possibly be ordered at discharge, at this time the answer is -  Not likely.  CM will provide weekend CM with handoff informing that pt may require [place] letter.  CM met with pt and wife and informed that currently medications ordered should be less than $30 if prescribed for home; pt stated he could pay this amount, CM also informed pt that he could request medication assistance via weekend CM if at discharge this is not the case.  01/01/16- 1000- Marvetta Gibbons RN, BSN - pt s/p CABG 1/9- tx from ICU on 12/31/15- still has EPW- NCM to follow for d/c needs- pt self pay, has PCP, and uses [facility].   1221 12-25-15 Jacqlyn Krauss, RN,BSN [phone] Pt was discussed in the [facility] meting. Plan for CABG on Monday.  CM will continue to monitor for additional disposition needs.    Maryclare Labrador, RN 01/02/2016, 3:28 PM

## 2016-01-02 NOTE — Progress Notes (Signed)
CARDIAC REHAB PHASE I   PRE:  Rate/Rhythm: 79 SR  BP:  Supine: 134/66  Sitting:   Standing:    SaO2: 94%RA  MODE:  Ambulation: 890 ft   POST:  Rate/Rhythm: 91  BP:  Supine:   Sitting: 145/72  Standing:    SaO2: 97%RA 0850-0947 Pt walked 890 ft on RA with hand held asst with steady gait. Tolerated well. Education completed with pt who voiced understanding. Encouraged pt to watch carbs with A1C of 6.7. Gave carb counting information and heart healthy diets. Discussed CRP 2 and will refer to [place]. Gave financial form as pt does not have insurance. Encouraged him to fill out and send. Gave ex ed for home. Has seen discharge video. Does not need rolling walker for home.   Luetta Nuttingharlene Caris Cerveny, RN BSN  01/02/2016 9:44 AM

## 2016-01-02 NOTE — Progress Notes (Addendum)
      [address]       [place] [id number]             [phone]      4 Days Post-Op Procedure(s) (LRB): CORONARY ARTERY BYPASS GRAFTING (CABG) x four,  using left internal mammary artery and right leg greater saphenous vein harvested endoscopically (N/A) TRANSESOPHAGEAL ECHOCARDIOGRAM (TEE) (N/A)   Subjective:  Adrian Foley has no complaints.  He feels great this morning.  He is ambulating independently.  + BM  Objective: Vital signs in last 24 hours: Temp:  [97.8 F (36.6 C)-98.1 F (36.7 C)] 98.1 F (36.7 C) (01/13 0558) Pulse Rate:  [66-79] 72 (01/13 0558) Cardiac Rhythm:  [-] Normal sinus rhythm (01/12 2030) Resp:  [17-18] 18 (01/13 0558) BP: (97-127)/(51-75) 127/75 mmHg (01/13 0558) SpO2:  [93 %-95 %] 93 % (01/13 0558) Weight:  [194 lb 14.4 oz (88.406 kg)] 194 lb 14.4 oz (88.406 kg) (01/13 0558)  Intake/Output from previous day: 01/12 0701 - 01/13 0700 In: 720 [P.O.:720] Out: 1501 [Urine:1500; Stool:1]  General appearance: alert, cooperative and no distress Heart: regular rate and rhythm Lungs: clear to auscultation bilaterally Abdomen: soft, non-tender; bowel sounds normal; no masses,  no organomegaly Wound: clean and dry  Lab Results:  Recent Labs  12/31/15 0421 01/01/16 0345  WBC 10.6* 10.0  HGB 11.7* 11.8*  HCT 34.9* 35.4*  PLT 133* 150   BMET:  Recent Labs  12/31/15 0421 01/01/16 0345  NA 136 140  K 3.9 4.2  CL 103 106  CO2 27 25  GLUCOSE 136* 122*  BUN 17 17  CREATININE 1.10 1.01  CALCIUM 8.3* 8.4*    PT/INR: No results for input(s): LABPROT, INR in the last 72 hours. ABG    Component Value Date/Time   PHART 7.399 12/30/2015 0024   HCO3 22.8 12/30/2015 0024   TCO2 24 12/30/2015 1609   ACIDBASEDEF 2.0 12/30/2015 0024   O2SAT 94.0 12/30/2015 0024   CBG (last 3)   Recent Labs  01/01/16 1641 01/01/16 2118 01/02/16 0557  GLUCAP 94 130* 119*    Assessment/Plan: S/P Procedure(s) (LRB): CORONARY ARTERY BYPASS  GRAFTING (CABG) x four,  using left internal mammary artery and right leg greater saphenous vein harvested endoscopically (N/A) TRANSESOPHAGEAL ECHOCARDIOGRAM (TEE) (N/A)  1. CV- NSR, continue Lopressor at 12.5 mg BID, will monitor BP may be able to start low dose ACE at discharge 2. Pulm- no acute issues, off oxygen 3. Renal- hypervolemia improved, weight is at baseline on Lasix for now will taper over next several days 4. LOC Constipation- resolved 5. Dispo- patient stable, maintaining NSR... Will d/c EPW, home in AM if no issues   LOS: 12 days    Adrian Foley, Adrian Foley 01/02/2016  patient examined and medical record reviewed,agree with above note. Kathlee Nationseter Van Trigt III 01/02/2016

## 2016-01-02 NOTE — Progress Notes (Signed)
Order received to removing pacing wires.  Pacing wires removed with wires intact.  Pt tol well.  Pt to be on bedrest for one hour.  VS stable at this time.

## 2016-01-03 LAB — GLUCOSE, CAPILLARY
GLUCOSE-CAPILLARY: 141 mg/dL — AB (ref 65–99)
Glucose-Capillary: 145 mg/dL — ABNORMAL HIGH (ref 65–99)

## 2016-01-03 MED ORDER — METFORMIN HCL 500 MG PO TABS: 500.0000 mg | ORAL_TABLET | Freq: Two times a day (BID) | ORAL | Status: DC

## 2016-01-03 MED ORDER — OXYCODONE HCL 5 MG PO TABS: 5.0000 mg | ORAL_TABLET | ORAL | Status: DC | PRN

## 2016-01-03 MED ORDER — SIMVASTATIN 20 MG PO TABS: 20.0000 mg | ORAL_TABLET | Freq: Every day | ORAL | Status: DC

## 2016-01-03 MED ORDER — METOPROLOL TARTRATE 25 MG PO TABS: 12.5000 mg | ORAL_TABLET | Freq: Two times a day (BID) | ORAL | Status: DC

## 2016-01-03 MED ORDER — POTASSIUM CHLORIDE CRYS ER 20 MEQ PO TBCR: 20.0000 meq | EXTENDED_RELEASE_TABLET | Freq: Every day | ORAL | Status: DC

## 2016-01-03 MED ORDER — FUROSEMIDE 40 MG PO TABS: 40.0000 mg | ORAL_TABLET | Freq: Every day | ORAL | Status: DC

## 2016-01-03 MED ORDER — TRAMADOL HCL 50 MG PO TABS: 50.0000 mg | ORAL_TABLET | ORAL | Status: DC | PRN

## 2016-01-03 NOTE — Progress Notes (Signed)
      [address]       [place] [id number]             [phone]      5 Days Post-Op Procedure(s) (LRB): CORONARY ARTERY BYPASS GRAFTING (CABG) x four,  using left internal mammary artery and right leg greater saphenous vein harvested endoscopically (N/A) TRANSESOPHAGEAL ECHOCARDIOGRAM (TEE) (N/A)   Subjective:  Adrian Foley has no complaints.  Continues to feel great and is ready to go home.  + ambulation  + BM  Objective: Vital signs in last 24 hours: Temp:  [97.5 F (36.4 C)-98.4 F (36.9 C)] 98.1 F (36.7 C) (01/14 0442) Pulse Rate:  [67-88] 72 (01/14 0442) Cardiac Rhythm:  [-] Normal sinus rhythm (01/13 2030) Resp:  [18] 18 (01/14 0442) BP: (108-145)/(62-72) 111/62 mmHg (01/14 0442) SpO2:  [94 %-96 %] 94 % (01/14 0442) Weight:  [192 lb 0.3 oz (87.1 kg)] 192 lb 0.3 oz (87.1 kg) (01/14 0442)  Intake/Output from previous day: 01/13 0701 - 01/14 0700 In: 840 [P.O.:840] Out: 2050 [Urine:2050]  General appearance: alert, cooperative and no distress Heart: regular rate and rhythm Lungs: clear to auscultation bilaterally Abdomen: soft, non-tender; bowel sounds normal; no masses,  no organomegaly Extremities: edema trace, [facility] W[facility]: clean and dry  Lab Results:  Recent Labs  01/01/16 0345  WBC 10.0  HGB 11.8*  HCT 35.4*  PLT 150   BMET:  Recent Labs  01/01/16 0345  NA 140  K 4.2  CL 106  CO2 25  GLUCOSE 122*  BUN 17  CREATININE 1.01  CALCIUM 8.4*    PT/INR: No results for input(s): LABPROT, INR in the last 72 hours. ABG    Component Value Date/Time   PHART 7.399 12/30/2015 0024   HCO3 22.8 12/30/2015 0024   TCO2 24 12/30/2015 1609   ACIDBASEDEF 2.0 12/30/2015 0024   O2SAT 94.0 12/30/2015 0024   CBG (last 3)   Recent Labs  01/02/16 1620 01/02/16 2051 01/03/16 0552  GLUCAP 115* 129* 145*    Assessment/Plan: S/P Procedure(s) (LRB): CORONARY ARTERY BYPASS GRAFTING (CABG) x four,  using left  internal mammary artery and right leg greater saphenous vein harvested endoscopically (N/A) TRANSESOPHAGEAL ECHOCARDIOGRAM (TEE) (N/A)  1. CV- NSR, BP remains labile 90s-140- will continue Lopressor, hold off on ACE- may be able to start later once BP stabilizes 2. Pulm- no acute issues, off oxygen, continue IS at discharge 3. Renal- weight is below baseline, will taper Lasix  4. DM- sugars controlled 5. dispo- patient stable, will d/c home today   LOS: 13 days    Adrian Foley 01/03/2016

## 2016-01-03 NOTE — Progress Notes (Signed)
Removed CT sutures and applied benzoin and 1/2 " steri strips.  Pt tolerated procedure well. Pt given signs and symptoms of infection. Pt/family given discharge instructions, medication lists, follow up appointments, and when to call the doctor.  Pt/family verbalizes understanding. Nafeesa Dils McClintock, RN ° ° ° °

## 2016-01-12 ENCOUNTER — Ambulatory Visit: Payer: Self-pay | Admitting: Family Medicine

## 2016-01-15 NOTE — Progress Notes (Signed)
Cardiology Office Note   Date:  01/16/2016   ID:  Adrian Foley, DOB 09/21/1951, MRN [id number]  PCP:  Adrian Petit, MD  Cardiologist:   Adrian Hook, MD   Chief Complaint  Patient presents with  . 2 week post CABG  . Dizziness     History of Present Illness: Adrian Foley is a 65 y.o. male with CAD s/p CABG and PCI, diabetes mellitus type 2, and hyperlipidemia who presents for follow up after CABG.  Adrian Foley presented to [facility] on 12/21/15 with a non-ST elevation MI.He underwent cardiac catheterization that revealed diffuse, 3 vessel coronary artery disease.  Echo revealed LVEF 55-60%, grade 1 diastolic dysfunction, and hypokinesis of the apical inferoseptal myocardium.  He underwent CABG (LIMA-->LAD, SVG-->RI, OM, and PL) on 12/29/15.  uring that hospitalization he was also diagnosed with diabetes with a hemoglobin A1c of 6.5%. He was started on metformin. The surgery was uncomplicated and he was discharged on 01/01/16.  Since discharge Adrian Foley has been doing well. He has been walking for over an hour per day and denies any chest pain or shortness of breath.  He has noted some dizziness and blurry vision since he was discharged. It occurs most frequently with changes of position.  He denies syncope but endorses pre-syncope.  It typically gets better after walking for a while.  He denies any lower extremity edema, orthopnea or PND.  Adrian Foley is not interested in cardiac rehab.  He does not currently have insurance and things that he can do rehab on his own.  He stopped taking all his medications 1.5 years ago and worked on diet and exercise.  Prior to that he took fenofibrate for hypertriglyceridemia for 15 years but has not been taking it lately.    Past Medical History  Diagnosis Date  . Kidney stones   . CAD (coronary artery disease)   . Hyperlipidemia   . Diabetes mellitus without complication (HCC)     type 2   . NSTEMI (non-ST elevated myocardial  infarction) [facility])     Past Surgical History  Procedure Laterality Date  . Angioplasty    . Cardiac catheterization N/A 12/23/2015    Procedure: Left Heart Cath and Coronary Angiography;  Surgeon: Adrian Lex, MD;  [facility]VASIVE CV LAB;  Service: Cardiovascular;  Laterality: N/A;  any available cath attending  . Coronary artery bypass graft N/A 12/29/2015    Procedure: CORONARY ARTERY BYPASS GRAFTING (CABG) x four,  using left internal mammary artery and right leg greater saphenous vein harvested endoscopically;  Surgeon: Adrian Perna, MD;  Location: [facility] OR;  Service: Open Heart Surgery;  Laterality: N/A;  . Tee without cardioversion N/A 12/29/2015    Procedure: TRANSESOPHAGEAL ECHOCARDIOGRAM (TEE);  Surgeon: Adrian Perna, MD;  Location: [facility] OR;  Service: Open Heart Surgery;  Laterality: N/A;     Current Outpatient Prescriptions  Medication Sig Dispense Refill  . aspirin 325 MG tablet Take 325 mg by mouth daily.    . furosemide (LASIX) 40 MG tablet Take 1 tablet (40 mg total) by mouth daily. For 7 Days 7 tablet 0  . metFORMIN (GLUCOPHAGE) 500 MG tablet Take 1 tablet (500 mg total) by mouth 2 (two) times daily with a meal. 60 tablet 3  . metoprolol tartrate (LOPRESSOR) 25 MG tablet Take 0.5 tablets (12.5 mg total) by mouth 2 (two) times daily. 30 tablet 3  . simvastatin (ZOCOR) 20 MG tablet Take 1 tablet (  20 mg total) by mouth daily at 6 PM. 30 tablet 3   No current facility-administered medications for this visit.    Allergies:   Review of patient's allergies indicates no known allergies.    Social History:  The patient  reports that he quit smoking about 8 years ago. He quit smokeless tobacco use about 7 months ago. His smokeless tobacco use included Chew. He reports that he does not drink alcohol or use illicit drugs.   Family History:  The patient's family history includes Heart disease in his maternal aunt.    ROS:  Please see the history of present illness.    Otherwise, review of systems are positive for none.   All other systems are reviewed and negative.    PHYSICAL EXAM: VS:  BP 108/72 mmHg  Pulse 78  Ht  (1.753 m)  Wt 87.998 kg (194 lb)  BMI 28.64 kg/m2 , BMI Body mass index is 28.64 kg/(m^2). GENERAL:  Well appearing HEENT:  Pupils equal round and reactive, fundi not visualized, oral mucosa unremarkable NECK:  No jugular venous distention, waveform within normal limits, carotid upstroke brisk and symmetric, no bruits, no thyromegaly LYMPHATICS:  No cervical adenopathy LUNGS:  Clear to auscultation bilaterally HEART:  RRR.  PMI not displaced or sustained,S1 and S2 within normal limits, no S3, no S4, no clicks, no rubs, no murmurs ABD:  Flat, positive bowel sounds normal in frequency in pitch, no bruits, no rebound, no guarding, no midline pulsatile mass, no hepatomegaly, no splenomegaly EXT:  2 plus pulses throughout, no edema, no cyanosis no clubbing SKIN:  No rashes no nodules.  Healing median sternotomy incision healing well. No erythema or drainage. NEURO:  Cranial nerves II through XII grossly intact, motor grossly intact throughout PSYCH:  Cognitively intact, oriented to person place and time    EKG:  EKG is ordered today. The ekg ordered today demonstrates sinus rhythm.  Rate 78 bpm.  Prior inferior infarct.  Cardiac catheterization 12/23/2015: 1. Ost RCA to Dist RCA lesion, 100% stenosed. Dist RCA lesion, 99% stenosed. Ost RPDA lesion, 100% stenosed. Post Atrio lesion, 90% stenosed. 2. Prox LAD to Mid LAD lesion, 95% stenosed. The lesion was previously treated with a bare metal stent greater than two years ago. Severe IN-Stent Restenosis 3. Dist LAD lesion, 100% stenosed beond D2. 2nd Diag lesion, 70% stenosed. 4. 2nd Mrg lesion, 65% stenosed. The left ventricular systolic function is normal.   Echo 12/21/15  Study Conclusions  - Left ventricle: The cavity size was normal. Wall thickness was normal. Systolic  function was normal. The estimated ejection fraction was in the range of 55% to 60%. Possible hypokinesis of the apicalinferoseptal myocardium. Doppler parameters are consistent with abnormal left ventricular relaxation (grade 1 diastolic dysfunction). - Aortic root: The aortic root was mildly dilated. - Mitral valve: Mildly calcified leaflets . There was trivial regurgitation. - Tricuspid valve: There was trivial regurgitation. - Pulmonary arteries: Systolic pressure could not be accurately estimated. - Pericardium, extracardiac: There was no pericardial effusion.  Impressions:  - Normal LV wall thickness with LVEF 55-60%, possible apical inferoseptal hypokinesis, grade 1 diastolic dysfunction. Mildly dilated aortic root. Trivial mitral and tricuspid regurgitation.   Recent Labs: 12/21/2015: ALT 29; B Natriuretic Peptide 34.9; TSH 3.582 12/30/2015: Magnesium 2.5* 01/01/2016: BUN 17; Creatinine, Ser 1.01; Hemoglobin 11.8*; Platelets 150; Potassium 4.2; Sodium 140    Lipid Panel    Component Value Date/Time   CHOL 194 12/22/2015 0329   TRIG 470* 12/22/2015 0329  HDL 30* 12/22/2015 0329   CHOLHDL 6.5 12/22/2015 0329   VLDL UNABLE TO CALCULATE IF TRIGLYCERIDE OVER 400 mg/dL 16/09/9603 5409   LDLCALC UNABLE TO CALCULATE IF TRIGLYCERIDE OVER 400 mg/dL 81/19/1478 2956   LDLDIRECT 82 12/24/2015 0856      Wt Readings from Last 3 Encounters:  01/16/16 87.998 kg (194 lb)  01/03/16 87.1 kg (192 lb 0.3 oz)  12/20/15 88.451 kg (195 lb)      ASSESSMENT AND PLAN:  # CAD s/p CABG: Mr. Siems is doing well post-CABG.  He initially presented with STEMI.  Given that he is now out of the immediate post-operative period we will start plavix with plans for him to take it for one year.  We will switch aspirin from  to  daily.  Ideally he would remain on metoprolol.  However, his blood pressure is quite low and he complains of dizziness and vision changes on the lowest  dose of metoprolol.  Therefore, we will stop metoprolol to see if this helps his symptoms.  Continue simvastatin.  LDL 82.  Mr. Kaczmarek asks when he can return to work.  Typically, he would wait 3 months to return in full capacity.  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  He will follow up with Dr. Maren Beach in early February.  # Hyperlipidemia: Continue simvastatin as above.  He started both metformin and simvastatin in the hospital.  Lipids should be repeated in 3 months to determine if he needs targeted triglyceride therapy.   Current medicines are reviewed at length with the patient today.  The patient does not have concerns regarding medicines.  The following changes have been made:  Start Plavix 75 mg daily.  Reduce aspirin to 81 mg daily.  Stop metoprolol.  Labs/ tests ordered today include:  No orders of the defined types were placed in this encounter.     Disposition:   FU with Ariana Cavenaugh C. Duke Salvia, MD, [facility] in 1 year.   This note was written with the assistance of speech recognition software.  Please excuse any transcriptional errors.  Signed, Lorean Ekstrand C. Duke Salvia, MD, [facility]  01/16/2016 3:03 PM    [facility]

## 2016-01-16 ENCOUNTER — Ambulatory Visit (INDEPENDENT_AMBULATORY_CARE_PROVIDER_SITE_OTHER): Payer: Self-pay | Admitting: Cardiovascular Disease

## 2016-01-16 ENCOUNTER — Telehealth: Payer: Self-pay

## 2016-01-16 ENCOUNTER — Encounter: Payer: Self-pay | Admitting: Cardiovascular Disease

## 2016-01-16 VITALS — BP 108/72 | HR 78 | Ht 69.0 in | Wt 194.0 lb

## 2016-01-16 DIAGNOSIS — Z9861 Coronary angioplasty status: Secondary | ICD-10-CM

## 2016-01-16 DIAGNOSIS — I2510 Atherosclerotic heart disease of native coronary artery without angina pectoris: Secondary | ICD-10-CM

## 2016-01-16 DIAGNOSIS — E785 Hyperlipidemia, unspecified: Secondary | ICD-10-CM

## 2016-01-16 DIAGNOSIS — Z951 Presence of aortocoronary bypass graft: Secondary | ICD-10-CM

## 2016-01-16 MED ORDER — CLOPIDOGREL BISULFATE 75 MG PO TABS: 75.0000 mg | ORAL_TABLET | Freq: Every day | ORAL | Status: DC

## 2016-01-16 MED ORDER — ASPIRIN EC 81 MG PO TBEC: 81.0000 mg | DELAYED_RELEASE_TABLET | Freq: Every day | ORAL | Status: DC

## 2016-01-16 NOTE — Telephone Encounter (Signed)
Faxed signed phase 2 orders to cardiac rehab. 

## 2016-01-16 NOTE — Patient Instructions (Signed)
START CLOPIDOGREL 75 MG ONE TABLET DAILY  DECREASE ASPIRIN TO 81 MG ONE TABLET DAILY  STOP  TAKING METOPROLOL  ASK SURGEON ABOUT GOING BACK TO WORK.   Your physician wants you to follow-up in 12 MONTHS WITH DR [facility].  You will receive a reminder letter in the mail two months in advance. If you don't receive a letter, please call our office to schedule the follow-up appointment.  If you need a refill on your cardiac medications before your next appointment, please call your pharmacy.

## 2016-01-18 ENCOUNTER — Encounter: Payer: Self-pay | Admitting: Cardiovascular Disease

## 2016-01-19 ENCOUNTER — Ambulatory Visit: Payer: Self-pay | Admitting: Family Medicine

## 2016-01-20 ENCOUNTER — Encounter: Payer: Self-pay | Admitting: Family Medicine

## 2016-01-20 ENCOUNTER — Ambulatory Visit (INDEPENDENT_AMBULATORY_CARE_PROVIDER_SITE_OTHER): Payer: Self-pay | Admitting: Family Medicine

## 2016-01-20 VITALS — BP 100/69 | HR 91 | Temp 98.5°F | Resp 16 | Ht 68.0 in | Wt 187.8 lb

## 2016-01-20 DIAGNOSIS — E782 Mixed hyperlipidemia: Secondary | ICD-10-CM

## 2016-01-20 DIAGNOSIS — I2510 Atherosclerotic heart disease of native coronary artery without angina pectoris: Secondary | ICD-10-CM

## 2016-01-20 DIAGNOSIS — E119 Type 2 diabetes mellitus without complications: Secondary | ICD-10-CM

## 2016-01-20 NOTE — Progress Notes (Signed)
Office Note 01/20/2016  CC:  Chief Complaint  Patient presents with  . Establish Care    transfer from Dr. Cato Foley. Pt is not fasting.     HPI:  Adrian Foley is a 65 y.o. White male who is here to transfer/establish care. [name] most recent primary MD: Dr. Cato Foley (retired from clinical practice). Old records in EPIC/HL EMR were reviewed prior to or during today's visit.  Pt is self pay and this affects all of his medical decisions. Pt underwent CABG 22 days ago, had cardiology f/u 5 days ago and was doing well at that time except bp low so metoprolol stopped.  He was changed from  ASA to  ASA and plavix  was started. He has some questions about plavix, is a bit wary about potential bleeding risk on ASA/plavix combo.  While in hosp his A1c was 6.5% and he was started on metformin.  He says he is pretty well educated about diet for DM. Says he was actually dx'd by Dr. Cato Foley about 2 yrs ago, was a bit upset that he didn't get discussion of this with Dr. Cato Foley so pt decided to stop all meds except ASA x 2 yrs, up until the time of his STEMI a few weeks ago.  He was also started on simvastatin in hosp.  Has hx of being on this med but having myalgias and had to have it stopped. He reports mild myalgias in mornings since getting back on it but is willing to give this more time--no change desired right now.  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  Past Medical History  Diagnosis Date  . Kidney stones   . CAD (coronary artery disease)   . Hyperlipidemia   . Diabetes mellitus without complication (HCC)     type 2   . NSTEMI (non-ST elevated myocardial infarction) (HCC)   . Seizures (HCC)     post-traumatic (pediatric) initially, then was medicated for a few years, then was weened off med and has been seizure-free since.    Past Surgical History  Procedure Laterality Date  . Coronary angioplasty with stent  placement      approx age 27 per pt report  . Cardiac catheterization N/A 12/23/2015    Procedure: Left Heart Cath and Coronary Angiography;  Surgeon: Marykay Lex, MD;  Location: [facility] INVASIVE CV LAB;  Service: Cardiovascular;  Laterality: N/A;  any available cath attending  . Coronary artery bypass graft N/A 12/29/2015    Procedure: CORONARY ARTERY BYPASS GRAFTING (CABG) x four,  using left internal mammary artery and right leg greater saphenous vein harvested endoscopically;  Surgeon: [name], MD;  Location: [facility] OR;  Service: Open Heart Surgery;  Laterality: N/A;  . Tee without cardioversion N/A 12/29/2015    Procedure: TRANSESOPHAGEAL ECHOCARDIOGRAM (TEE);  Surgeon: [name], MD;  Location: [facility] OR;  Service: Open Heart Surgery;  Laterality: N/A;  No colonoscopy: by choice  Family History  Problem Relation Age of Onset  . Heart disease Maternal Aunt   . Alcohol abuse Father   . Heart disease Maternal Uncle     Social History   Social History  . Marital Status: Single    Spouse Name: N/A  . Number of Children: N/A  . Years of Education: N/A   Occupational History  . Not on file.   Social History Main Topics  . Smoking status: Former Smoker  Quit date: 12/21/2007  . Smokeless tobacco: Former Neurosurgeon    Types: Chew    Quit date: 05/23/2015  . Alcohol Use: No  . Drug Use: No  . Sexual Activity: Not on file   Other Topics Concern  . Not on file   Social History Narrative   Married, 3 children, 6 grandkids.   Lives in [place].   Occup: carpentry   Tob: 30 pack-yr hx, quit 2009.  Chewed tobacco x "years"-quit around age 55.   Alc: none       Outpatient Encounter Prescriptions as of 01/20/2016  Medication Sig  . aspirin 325 MG tablet Take 325 mg by mouth daily.  . metFORMIN (GLUCOPHAGE) 500 MG tablet Take 1 tablet (500 mg total) by mouth 2 (two) times daily with a meal.  . simvastatin (ZOCOR) 20 MG tablet Take 1 tablet (20 mg total) by mouth daily at 6 PM.  .  clopidogrel (PLAVIX) 75 MG tablet Take 1 tablet (75 mg total) by mouth daily. (Patient not taking: Reported on 01/20/2016)  . [DISCONTINUED] aspirin EC 81 MG tablet Take 1 tablet (81 mg total) by mouth daily. (Patient not taking: Reported on 01/20/2016)  . [DISCONTINUED] furosemide (LASIX) 40 MG tablet Take 1 tablet (40 mg total) by mouth daily. For 7 Days (Patient not taking: Reported on 01/20/2016)  . [DISCONTINUED] metoprolol tartrate (LOPRESSOR) 25 MG tablet Take 0.5 tablets (12.5 mg total) by mouth 2 (two) times daily. (Patient not taking: Reported on 01/20/2016)   No facility-administered encounter medications on file as of 01/20/2016.    No Known Allergies  ROS Review of Systems  Constitutional: Negative for fever and fatigue.  HENT: Negative for congestion and sore throat.   Eyes: Positive for visual disturbance (mild decreased visual acuity ever since hospitalization.).  Respiratory: Negative for cough.   Cardiovascular: Negative for chest pain.  Gastrointestinal: Negative for nausea and abdominal pain.  Genitourinary: Negative for dysuria.  Musculoskeletal: Negative for back pain and joint swelling.  Skin: Negative for rash.  Neurological: Negative for weakness and headaches.  Hematological: Negative for adenopathy.    PE; Blood pressure 100/69, pulse 91, temperature 98.5 F (36.9 C), temperature source Oral, resp. rate 16, height  (1.727 m), weight 187 lb 12 oz (85.163 kg), SpO2 96 %. Gen: Alert, well appearing.  Patient is oriented to person, place, time, and situation. BJY:NWGN: no injection, icteris, swelling, or exudate.  EOMI, PERRLA. Mouth: lips without lesion/swelling.  Oral mucosa pink and moist. Oropharynx without erythema, exudate, or swelling.  Neck: supple/nontender.  No LAD, mass, or TM.  Carotid pulses 2+ bilaterally, without bruits. CV: RRR, no m/r/g.  Chest wall: no tenderness. LUNGS: CTA bilat, nonlabored resps, good aeration in all lung fields. ABD:  soft, NT/ND EXT: no clubbing, cyanosis, or edema.   Pertinent labs:  Lab Results  Component Value Date   HGBA1C 6.5* 12/25/2015    Lab Results  Component Value Date   TSH 3.582 12/21/2015   Lab Results  Component Value Date   WBC 10.0 01/01/2016   HGB 11.8* 01/01/2016   HCT 35.4* 01/01/2016   MCV 88.9 01/01/2016   PLT 150 01/01/2016   Lab Results  Component Value Date   CREATININE 1.01 01/01/2016   BUN 17 01/01/2016   NA 140 01/01/2016   K 4.2 01/01/2016   CL 106 01/01/2016   CO2 25 01/01/2016   Lab Results  Component Value Date   ALT 29 12/21/2015   AST 22 12/21/2015  ALKPHOS 63 12/21/2015   BILITOT 0.9 12/21/2015   Lab Results  Component Value Date   CHOL 194 12/22/2015   Lab Results  Component Value Date   HDL 30* 12/22/2015   Lab Results  Component Value Date   LDLCALC UNABLE TO CALCULATE IF TRIGLYCERIDE OVER 400 mg/dL 04/54/0981   Lab Results  Component Value Date   TRIG 470* 12/22/2015   Lab Results  Component Value Date   CHOLHDL 6.5 12/22/2015   Lab Results  Component Value Date   PSA 3.59 07/06/2010   ASSESSMENT AND PLAN:   Transfer/new pt:  1) STEMI, now s/p CABG 3 wks ago and is doing great. Continue ASA and plavix (x 1 yr), continue statin and we'll see how his tolerance of this med goes over the next few months.  His bp didn't tolerate beta blocker so this was recently d/c'd.  He declines cardiac rehab due to being self pay. He will reassess with his CV surgeon 02/04/16.  He'll ask again about return to work at that time.  2) DM 2: continue metformin 500 mg bid and eat diabetic diet, exercise as much as possible. Recheck A1c in 3 mo. Urine microalb/cr in 3 mo. Feet exam in 3 mo.  3) Hyperlipidemia, mixed: tolerating simvastatin ok at this time--only mild myalgias. Recheck FLP 3 mo.  Prev health care: pt declined flu vaccine today.  An After Visit Summary was printed and given to the patient.  Return in about 3 months  (around 04/18/2016) for routine chronic illness f/u--fasting.

## 2016-01-20 NOTE — Progress Notes (Signed)
Pre visit review using our clinic review tool, if applicable. No additional management support is needed unless otherwise documented below in the visit note. 

## 2016-02-02 ENCOUNTER — Other Ambulatory Visit: Payer: Self-pay | Admitting: *Deleted

## 2016-02-02 DIAGNOSIS — Z951 Presence of aortocoronary bypass graft: Secondary | ICD-10-CM

## 2016-02-04 ENCOUNTER — Ambulatory Visit (INDEPENDENT_AMBULATORY_CARE_PROVIDER_SITE_OTHER): Payer: Self-pay | Admitting: Cardiothoracic Surgery

## 2016-02-04 ENCOUNTER — Ambulatory Visit
Admission: RE | Admit: 2016-02-04 | Discharge: 2016-02-04 | Disposition: A | Discharge status: Home / Self Care | Payer: No Typology Code available for payment source | Source: Ambulatory Visit | Attending: Cardiothoracic Surgery | Admitting: Cardiothoracic Surgery

## 2016-02-04 ENCOUNTER — Encounter: Payer: Self-pay | Admitting: Cardiothoracic Surgery

## 2016-02-04 VITALS — BP 113/81 | HR 88 | Resp 16 | Ht 68.0 in | Wt 187.0 lb

## 2016-02-04 DIAGNOSIS — I214 Non-ST elevation (NSTEMI) myocardial infarction: Secondary | ICD-10-CM

## 2016-02-04 DIAGNOSIS — I25110 Atherosclerotic heart disease of native coronary artery with unstable angina pectoris: Secondary | ICD-10-CM

## 2016-02-04 DIAGNOSIS — Z951 Presence of aortocoronary bypass graft: Secondary | ICD-10-CM

## 2016-02-04 NOTE — Progress Notes (Signed)
PCP is [name], MD Referring Provider is Chilton Si, MD  Chief Complaint  Patient presents with  . Routine Post Op    s/p CABG X 4...12/29/15...with a CXR...has seen his cardiologist, declines cardiac rehab d/t self pay    HPI:one month followup after urgent CABG x4. Left IMA graft with 4 vein grafts. Good LV function. No problems after surgery. Discharged home in sinus rhythm. No recurrent symptoms of angina. No symptoms of CHF. Surgical incisions healing well.  Chest x-ray today is clear. Sternal wires intact. Cardiac silhouette normal. Patient is ready to resume driving and   normal level of activities. He should not lift more than 20 pounds until April 1.  He should continue his Plavix prescription until the vial is empty and then switch back to aspirin 325 daily.   Past Medical History  Diagnosis Date  . Kidney stones   . CAD (coronary artery disease)   . Hyperlipidemia   . Diabetes mellitus without complication (HCC)     type 2   . NSTEMI (non-ST elevated myocardial infarction) (HCC)   . Seizures (HCC)     post-traumatic (pediatric) initially, then was medicated for a few years, then was weened off med and has been seizure-free since.    Past Surgical History  Procedure Laterality Date  . Coronary angioplasty with stent placement      approx age 33 per pt report  . Cardiac catheterization N/A 12/23/2015    Procedure: Left Heart Cath and Coronary Angiography;  Surgeon: Marykay Lex, MD;  Location: [facility] INVASIVE CV LAB;  Service: Cardiovascular;  Laterality: N/A;  any available cath attending  . Coronary artery bypass graft N/A 12/29/2015    Procedure: CORONARY ARTERY BYPASS GRAFTING (CABG) x four,  using left internal mammary artery and right leg greater saphenous vein harvested endoscopically;  Surgeon: [name], MD;  [facility];  Service: Open Heart Surgery;  Laterality: N/A;  . Tee without cardioversion N/A 12/29/2015    Procedure: TRANSESOPHAGEAL  ECHOCARDIOGRAM (TEE);  Surgeon: [name], MD;  Location: [facility] OR;  Service: Open Heart Surgery;  Laterality: N/A;    Family History  Problem Relation Age of Onset  . Heart disease Maternal Aunt   . Alcohol abuse Father   . Heart disease Maternal Uncle     Social History Social History  Substance Use Topics  . Smoking status: Former Smoker    Quit date: 12/21/2007  . Smokeless tobacco: Former Neurosurgeon    Types: Chew    Quit date: 05/23/2015  . Alcohol Use: No    Current Outpatient Prescriptions  Medication Sig Dispense Refill  . aspirin 81 MG tablet Take 81 mg by mouth daily.    . clopidogrel (PLAVIX) 75 MG tablet Take 1 tablet (75 mg total) by mouth daily. 30 tablet 11  . metFORMIN (GLUCOPHAGE) 500 MG tablet Take 1 tablet (500 mg total) by mouth 2 (two) times daily with a meal. 60 tablet 3  . simvastatin (ZOCOR) 20 MG tablet Take 1 tablet (20 mg total) by mouth daily at 6 PM. 30 tablet 3   No current facility-administered medications for this visit.    No Known Allergies  Review of Systems  Improved strength appetite and sleep pattern Walking 30 minutes twice a day No problems with edema Is not need pain medication  BP 113/81 mmHg  Pulse 88  Resp 16  Ht  (1.727 m)  Wt 187 lb (84.823 kg)  BMI 28.44 kg/m2  SpO2 97% Physical Exam Alert and comfortable Sternum stable well-healed Heart rhythm regular no murmur or gallop Lungs clear Leg incision well-healed No pedal edema  Diagnostic Tests: Chest x-ray done today personally reviewed and is clear  Impression: Good early recovery after urgent CABG x4. Progression of activity limits discussed with patient.no lifting more than 20 pounds until April 1.  Plan: Return as needed.  Mikey Bussing, MD [facility] [phone]

## 2016-04-09 ENCOUNTER — Ambulatory Visit: Payer: Self-pay | Admitting: Family Medicine

## 2016-04-21 ENCOUNTER — Ambulatory Visit (INDEPENDENT_AMBULATORY_CARE_PROVIDER_SITE_OTHER): Payer: Self-pay | Admitting: Family Medicine

## 2016-04-21 ENCOUNTER — Encounter: Payer: Self-pay | Admitting: Family Medicine

## 2016-04-21 VITALS — BP 122/84 | HR 63 | Temp 98.0°F | Resp 16 | Ht 68.0 in | Wt 178.2 lb

## 2016-04-21 DIAGNOSIS — E1159 Type 2 diabetes mellitus with other circulatory complications: Secondary | ICD-10-CM

## 2016-04-21 DIAGNOSIS — E785 Hyperlipidemia, unspecified: Secondary | ICD-10-CM

## 2016-04-21 LAB — LIPID PANEL
CHOLESTEROL: 149 mg/dL (ref 0–200)
HDL: 40.8 mg/dL (ref >39.00)
LDL Cholesterol: 76 mg/dL (ref 0–99)
NonHDL: 108.07
Total CHOL/HDL Ratio: 4
Triglycerides: 160 mg/dL — ABNORMAL HIGH (ref 0.0–149.0)
VLDL: 32 mg/dL (ref 0.0–40.0)

## 2016-04-21 LAB — MICROALBUMIN / CREATININE URINE RATIO
CREATININE, U: 172.3 mg/dL
MICROALB UR: 1.4 mg/dL (ref 0.0–1.9)
MICROALB/CREAT RATIO: 0.8 mg/g (ref 0.0–30.0)

## 2016-04-21 LAB — BASIC METABOLIC PANEL
BUN: 15 mg/dL (ref 6–23)
CO2: 28 mEq/L (ref 19–32)
CREATININE: 0.88 mg/dL (ref 0.40–1.50)
Calcium: 9.4 mg/dL (ref 8.4–10.5)
Chloride: 104 mEq/L (ref 96–112)
GFR: 92.76 mL/min (ref >60.00)
Glucose, Bld: 120 mg/dL — ABNORMAL HIGH (ref 70–99)
POTASSIUM: 4.2 meq/L (ref 3.5–5.1)
Sodium: 140 mEq/L (ref 135–145)

## 2016-04-21 LAB — HEMOGLOBIN A1C: Hgb A1c MFr Bld: 6.5 % (ref 4.6–6.5)

## 2016-04-21 NOTE — Progress Notes (Signed)
Pre visit review using our clinic review tool, if applicable. No additional management support is needed unless otherwise documented below in the visit note. 

## 2016-04-21 NOTE — Progress Notes (Signed)
OFFICE VISIT  04/21/2016   CC:  Chief Complaint  Patient presents with  . Follow-up    Pt is fasting.    HPI:    Patient is a 65 y.o. Caucasian male who presents for DM 2, HLD, CAD. Interim f/u with CV surgeon "went great".  Surgeon took him off plavix. Says he feels great.  Glucoses: fasting 94-112 fasting and postprandial.  Highest gluc 133.   He eats a diabetic diet. No burning, tingling, or numbness in feet.  Tolerating zocor fine.     Past Medical History  Diagnosis Date  . Kidney stones   . CAD (coronary artery disease)     CABG 2017  . Hyperlipidemia   . Diabetes mellitus without complication (HCC)     type 2   . NSTEMI (non-ST elevated myocardial infarction) (HCC)   . Seizures (HCC)     post-traumatic (pediatric) initially, then was medicated for a few years, then was weened off med and has been seizure-free since.    Past Surgical History  Procedure Laterality Date  . Coronary angioplasty with stent placement      approx age 65 per pt report  . Cardiac catheterization N/A 12/23/2015    Procedure: Left Heart Cath and Coronary Angiography;  Surgeon: Marykay Lexavid W Harding, MD;  [facility];  Service: Cardiovascular;  Laterality: N/A;  any available cath attending  . Coronary artery bypass graft N/A 12/29/2015    Procedure: CORONARY ARTERY BYPASS GRAFTING (CABG) x four,  using left internal mammary artery and right leg greater saphenous vein harvested endoscopically;  Surgeon: Kerin PernaPeter Van Trigt, MD;  [facility];  Service: Open Heart Surgery;  Laterality: N/A;  . Tee without cardioversion N/A 12/29/2015    Procedure: TRANSESOPHAGEAL ECHOCARDIOGRAM (TEE);  Surgeon: Kerin PernaPeter Van Trigt, MD;  [facility];  Service: Open Heart Surgery;  Laterality: N/A;   MEDS: not taking  plavix listed below Outpatient Prescriptions Prior to Visit  Medication Sig Dispense Refill  . aspirin 81 MG tablet Take 81 mg by mouth daily.    . metFORMIN (GLUCOPHAGE) 500 MG tablet Take  1 tablet (500 mg total) by mouth 2 (two) times daily with a meal. 60 tablet 3  . clopidogrel (PLAVIX) 75 MG tablet Take 1 tablet (75 mg total) by mouth daily. 30 tablet 11  . simvastatin (ZOCOR) 20 MG tablet Take 1 tablet (20 mg total) by mouth daily at 6 PM. (Patient not taking: Reported on 04/21/2016) 30 tablet 3   No facility-administered medications prior to visit.    No Known Allergies  ROS As per HPI  PE: Blood pressure 122/84, pulse 63, temperature 98 F (36.7 C), temperature source Oral, resp. rate 16, height 5\' 8"  (1.727 m), weight 178 lb 4 oz (80.854 kg), SpO2 99 %. Gen: Alert, well appearing.  Patient is oriented to person, place, time, and situation. AFFECT: pleasant, lucid thought and speech. Foot exam - bilateral normal; no swelling, tenderness or skin or vascular lesions. Color and temperature is normal. Sensation is intact. Peripheral pulses are palpable. Toenails are normal.   LABS:  Lab Results  Component Value Date   TSH 3.582 12/21/2015   Lab Results  Component Value Date   WBC 10.0 01/01/2016   HGB 11.8* 01/01/2016   HCT 35.4* 01/01/2016   MCV 88.9 01/01/2016   PLT 150 01/01/2016   Lab Results  Component Value Date   CREATININE 1.01 01/01/2016   BUN 17 01/01/2016   NA 140 01/01/2016  K 4.2 01/01/2016   CL 106 01/01/2016   CO2 25 01/01/2016   Lab Results  Component Value Date   ALT 29 12/21/2015   AST 22 12/21/2015   ALKPHOS 63 12/21/2015   BILITOT 0.9 12/21/2015   Lab Results  Component Value Date   CHOL 194 12/22/2015   Lab Results  Component Value Date   HDL 30* 12/22/2015   Lab Results  Component Value Date   LDLCALC UNABLE TO CALCULATE IF TRIGLYCERIDE OVER 400 mg/dL 16/09/9603   Lab Results  Component Value Date   TRIG 470* 12/22/2015   Lab Results  Component Value Date   CHOLHDL 6.5 12/22/2015   Lab Results  Component Value Date   PSA 3.59 07/06/2010   Lab Results  Component Value Date   HGBA1C 6.5* 12/25/2015     IMPRESSION AND PLAN:  1) DM 2, good control. HbA1c today. Feet exam normal today. Urine microalb/cr today.  2) Hyperlipidemia: tolerating statin.  Recheck FLP today.  3) CAD, s/p CABG 12/2015: asymptomatic, continue ASA, statin, and diabetes control.  An After Visit Summary was printed and given to the patient.  FOLLOW UP: Return in about 4 months (around 08/22/2016) for routine chronic illness f/u.  Signed:  Santiago Bumpers, MD           04/21/2016

## 2016-04-25 ENCOUNTER — Other Ambulatory Visit: Payer: Self-pay | Admitting: Physician Assistant

## 2016-04-26 ENCOUNTER — Telehealth: Payer: Self-pay | Admitting: Family Medicine

## 2016-04-26 MED ORDER — SIMVASTATIN 20 MG PO TABS: 20.0000 mg | ORAL_TABLET | Freq: Every day | ORAL | Status: DC

## 2016-04-26 MED ORDER — METFORMIN HCL 500 MG PO TABS: 500.0000 mg | ORAL_TABLET | Freq: Two times a day (BID) | ORAL | Status: DC

## 2016-04-26 NOTE — Telephone Encounter (Signed)
Patient contacted pharmacy for refills of metformin & cholestrol med. Pharmacy states that our office will not fill Rx.

## 2016-04-26 NOTE — Telephone Encounter (Signed)
Spoke to patient. Advised sent refill for meds. Patient requested lab results. Gave lab results. Patient verbalized understanding. Patient would like to know if Metformin could be reduced. He says it makes all of his food taste like medicine. Advised would inform Dr. Milinda CaveMcGowen. Patient agreed.

## 2016-04-27 ENCOUNTER — Encounter: Payer: Self-pay | Admitting: Family Medicine

## 2016-04-27 NOTE — Telephone Encounter (Signed)
OK to stop metformin, but if HbA1c gets up to 7% or above, we'll have to restart metformin or a different medication.

## 2016-04-28 NOTE — Telephone Encounter (Signed)
Spoke to patient. Gave instructions. Patient verbalized understanding and was very grateful!

## 2016-07-19 ENCOUNTER — Encounter: Payer: Self-pay | Admitting: Family Medicine

## 2016-07-19 ENCOUNTER — Telehealth: Payer: Self-pay

## 2016-07-19 ENCOUNTER — Ambulatory Visit (INDEPENDENT_AMBULATORY_CARE_PROVIDER_SITE_OTHER): Payer: Self-pay | Admitting: Family Medicine

## 2016-07-19 ENCOUNTER — Ambulatory Visit (INDEPENDENT_AMBULATORY_CARE_PROVIDER_SITE_OTHER): Payer: Self-pay | Admitting: Physician Assistant

## 2016-07-19 VITALS — BP 130/88 | HR 72 | Temp 98.5°F | Resp 16 | Ht 68.0 in | Wt 183.2 lb

## 2016-07-19 VITALS — BP 118/70 | HR 71 | Temp 98.4°F | Resp 16 | Ht 69.0 in | Wt 185.0 lb

## 2016-07-19 DIAGNOSIS — H6122 Impacted cerumen, left ear: Secondary | ICD-10-CM

## 2016-07-19 MED ORDER — NEOMYCIN-COLIST-HC-THONZONIUM 3.3-3-10-0.5 MG/ML OT SUSP
3.0000 [drp] | Freq: Four times a day (QID) | OTIC | 0 refills | Status: DC
Start: 2016-07-19 — End: 2017-03-07

## 2016-07-19 NOTE — Patient Instructions (Addendum)
For relief of cerumen accumulation apply 1-2 drops of hydrogen peroxide weekly and allow to sit for about 10-15 minutes.     IF you received an x-ray today, you will receive an invoice from [facility]. Please contact [facility] at [phone] with questions or concerns regarding your invoice.   IF you received labwork today, you will receive an invoice from [employer]. Please contact [facility] at [phone] with questions or concerns regarding your invoice.   Our billing staff will not be able to assist you with questions regarding bills from these companies.  You will be contacted with the lab results as soon as they are available. The fastest way to get your results is to activate your My Chart account. Instructions are located on the last page of this paperwork. If you have not heard from [place] regarding the results in 2 weeks, please contact this office.

## 2016-07-19 NOTE — Progress Notes (Signed)
Pre visit review using our clinic review tool, if applicable. No additional management support is needed unless otherwise documented below in the visit note. 

## 2016-07-19 NOTE — Progress Notes (Signed)
   07/19/2016 7:37 PM   DOB: 29-Jan-1951 / MRN: [id number]  SUBJECTIVE:  TANUJ TENZER is a 65 y.o. male presenting for a "clogged up left ear." He has had this in the past.  Reports he began to notice a fullness in the left ear about 5 days ago and since that time he as begun to have mild hearing loss in the left ear.  He denies jaw pain and nasal congestion.   He has No Known Allergies.   He  has a past medical history of CAD (coronary artery disease); Diabetes mellitus without complication (HCC); Hyperlipidemia; Kidney stones; NSTEMI (non-ST elevated myocardial infarction) (HCC); and Seizures (HCC).    He  reports that he quit smoking about 8 years ago. He quit smokeless tobacco use about 13 months ago. His smokeless tobacco use included Chew. He reports that he does not drink alcohol or use drugs. He  has no sexual activity history on file. The patient  has a past surgical history that includes Coronary angioplasty with stent; Cardiac catheterization (N/A, 12/23/2015); Coronary artery bypass graft (N/A, 12/29/2015); and TEE without cardioversion (N/A, 12/29/2015).  His family history includes Alcohol abuse in his father; Heart disease in his maternal aunt and maternal uncle.  Review of Systems  HENT: Positive for hearing loss.     The problem list and medications were reviewed and updated by myself where necessary and exist elsewhere in the encounter.   OBJECTIVE:  BP 118/70 (BP Location: Right Arm, Patient Position: Sitting, Cuff Size: Normal)   Pulse 71   Temp 98.4 F (36.9 C)   Resp 16   Ht 5\' 9"  (1.753 m)   Wt 185 lb (83.9 kg)   SpO2 97%   BMI 27.32 kg/m   Physical Exam  Constitutional: Vital signs are normal.  HENT:  Right Ear: Tympanic membrane normal.  Left Ear: Decreased hearing is noted.  Ears:    No results found for this or any previous visit (from the past 72 hour(s)).  No results found.  ASSESSMENT AND PLAN  Trieu was seen today for cerumen  impaction.  Diagnoses and all orders for this visit:  Cerumen impaction, left: After about 40-50 H2O lavages and multiple attempts with alligator forceps I was able to disimpact his ear.  TM pearly grey surrounded by erythema.  Given the level of manipulation today I am placing him on ABX drops for the ear to cover for a possible otitis.  -     Ear cerumen removal    The patient is advised to call or return to clinic if he does not see an improvement in symptoms, or to seek the care of the closest emergency department if he worsens with the above plan.   Deliah Boston, MHS, PA-C [facility] 07/19/2016 7:37 PM

## 2016-07-19 NOTE — Progress Notes (Signed)
OFFICE NOTE  07/19/2016  CC:  Chief Complaint  Patient presents with  . Ear Pain    left ear x 2 days   HPI: Patient is a 65 y.o. Caucasian male who is here for left ear feeling stopped up x 2 d.   Pertinent PMH:  Past medical, surgical, social, and family history reviewed and no changes are noted since last office visit.  MEDS:  Outpatient Medications Prior to Visit  Medication Sig Dispense Refill  . aspirin 81 MG tablet Take 81 mg by mouth daily.    . simvastatin (ZOCOR) 20 MG tablet Take 1 tablet (20 mg total) by mouth daily. 30 tablet 2  . metFORMIN (GLUCOPHAGE) 500 MG tablet Take 1 tablet (500 mg total) by mouth 2 (two) times daily with a meal. (Patient not taking: Reported on 07/19/2016) 60 tablet 3   No facility-administered medications prior to visit.     PE: Blood pressure 130/88, pulse 72, temperature 98.5 F (36.9 C), temperature source Oral, resp. rate 16, height 5\' 8"  (1.727 m), weight 183 lb 4 oz (83.1 kg), SpO2 95 %. Gen: Alert, well appearing.  Patient is oriented to person, place, time, and situation. Right EAR: EAC clear, TM normal. Left EAR : EAC with 100% cerumen impaction.  IMPRESSION AND PLAN:  Complete L EAC cerumen impaction: nurse attempted irrigation briefly but pt reacted quite adversely to this, so we decided he would need ENT referral for extraction of this.  An After Visit Summary was printed and given to the patient.   Signed:  Santiago Bumpers, MD           07/19/2016   FOLLOW UP: prn

## 2016-07-19 NOTE — Telephone Encounter (Signed)
Got a call from patient's pharmacy wanting to change Cortisporin drops to something else.  All the [facility] pharmacies are out of them.   Please advise.

## 2016-07-21 ENCOUNTER — Other Ambulatory Visit: Payer: Self-pay | Admitting: *Deleted

## 2016-07-21 MED ORDER — SIMVASTATIN 20 MG PO TABS: 20.0000 mg | ORAL_TABLET | Freq: Every day | ORAL | 3 refills | Status: DC

## 2016-07-21 NOTE — Telephone Encounter (Signed)
RF request for simvastatin LOV: 04/21/16 Next ov: 08/30/16 Last written: 04/26/16 #30 w/ 2RF

## 2016-08-06 IMAGING — CR DG CHEST 2V
2 series · 2 of 2 positions shown · non-contrast
Comparison: Portable chest x-ray January 01, 2016

CLINICAL DATA: Status post CABG 4 weeks ago, no current chest
complaints.

EXAM:
CHEST  2 VIEW

[view not recorded (1 of 2)]
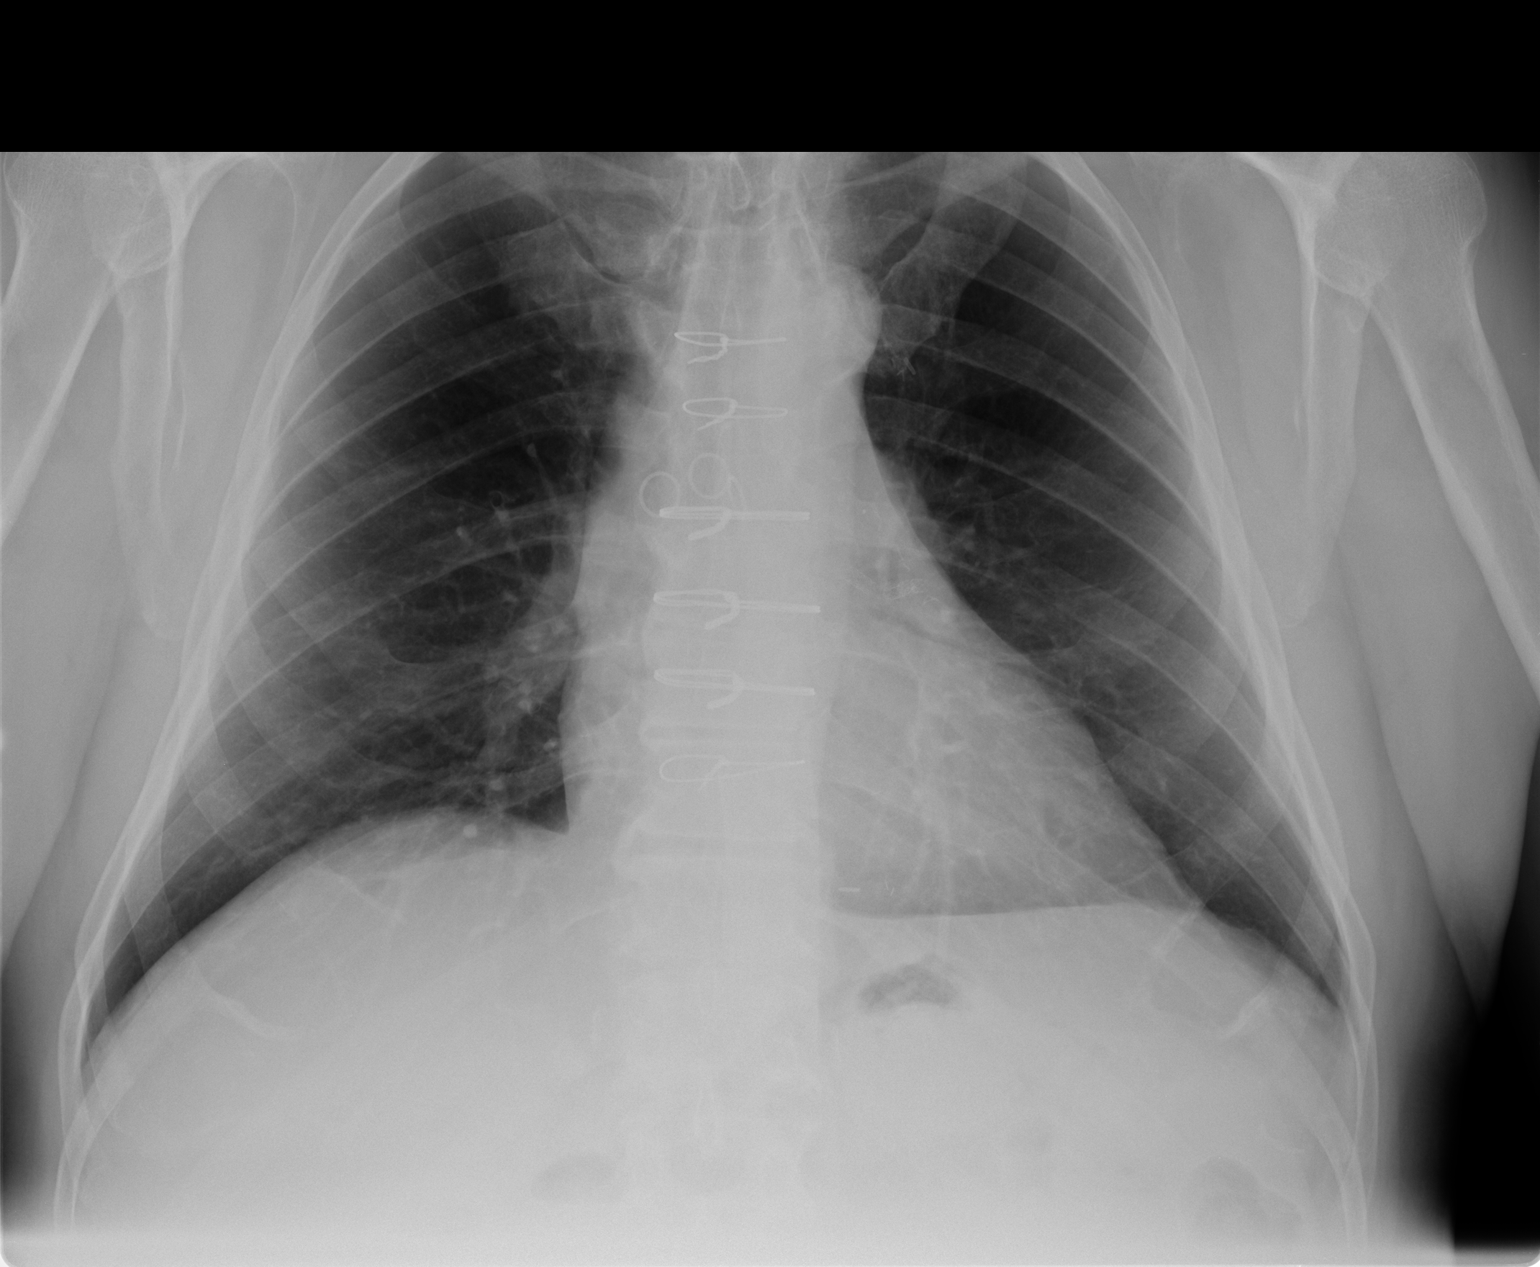

[view not recorded (2 of 2)]
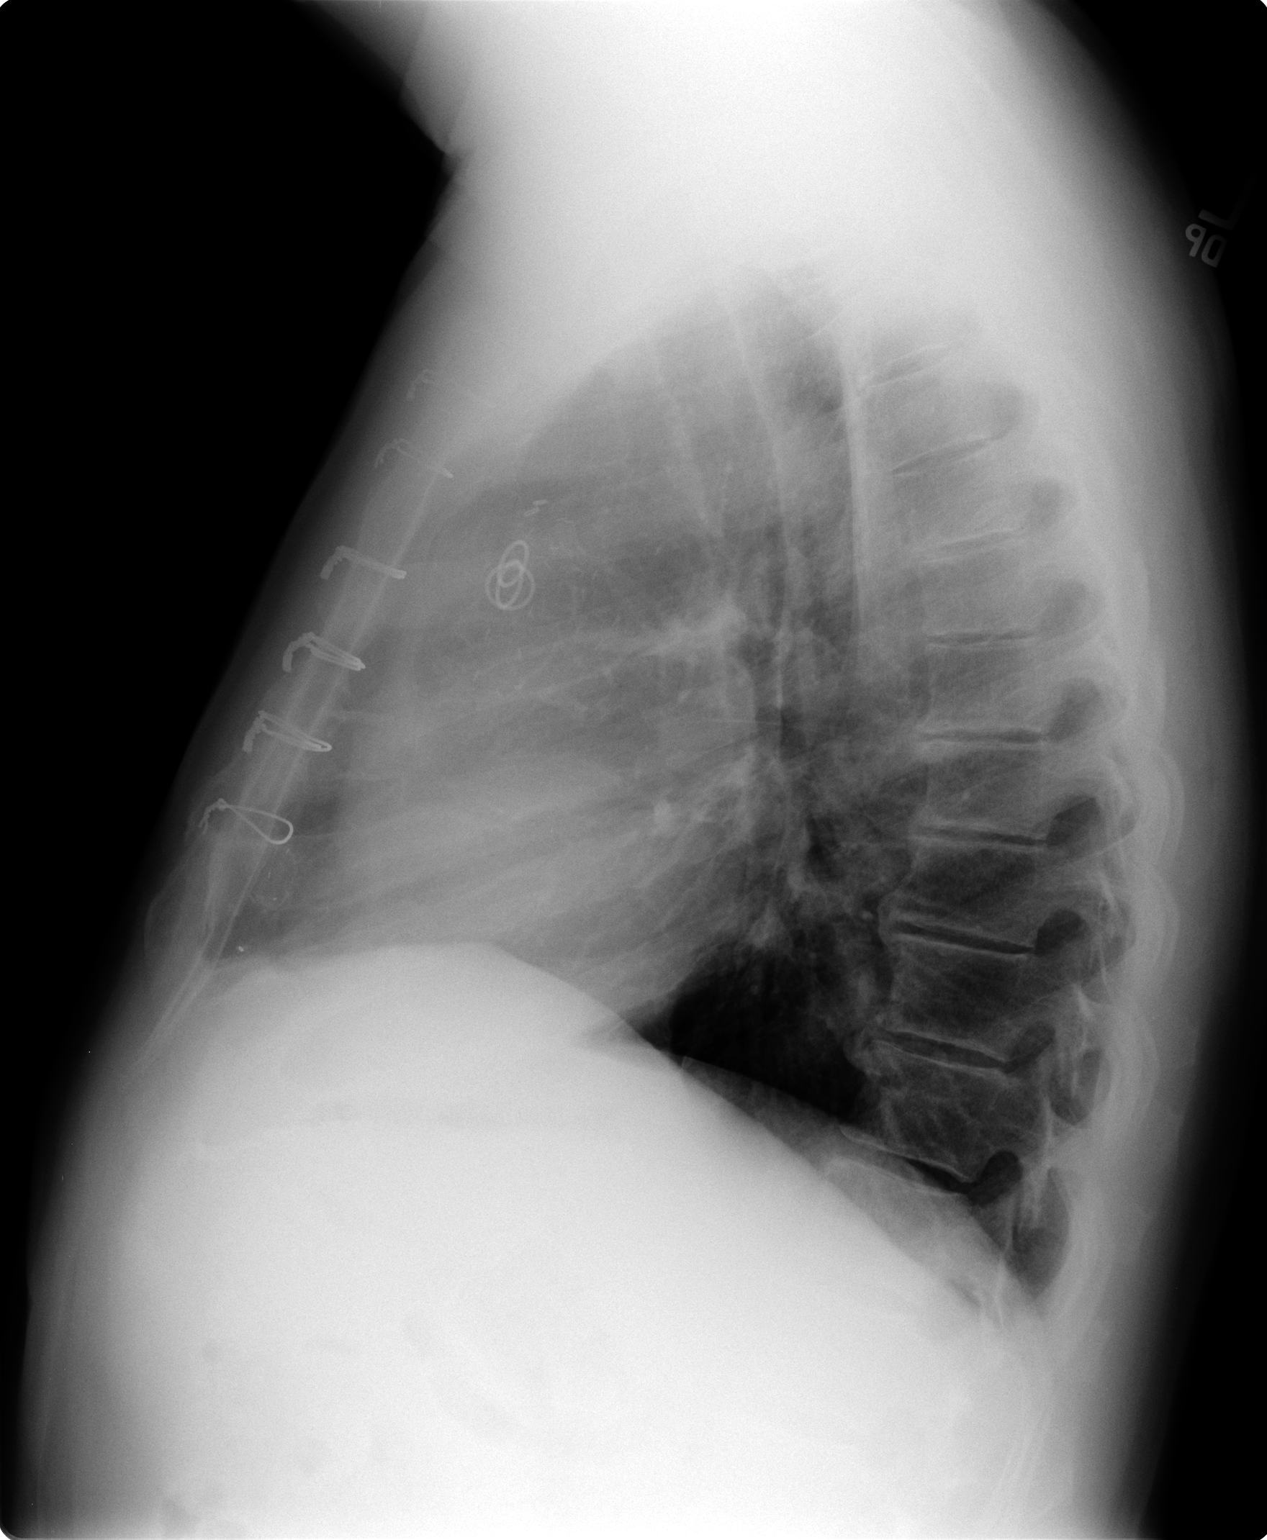

[2 of 2 positions shown; findings below may reference images not displayed]

FINDINGS: The lungs are well-expanded and clear. There is no pleural effusion,
pneumothorax, or pneumomediastinum. The heart is normal in size. The
pulmonary vascularity is not engorged. The mediastinum is normal in
width. The retrosternal soft tissues appear normal. The sternal
wires are intact. There is mild multilevel degenerative disc disease
of the thoracic spine.
IMPRESSION: Normal chest x-ray. No evidence of pneumonia, pleural effusion, or
CHF.

## 2016-08-30 ENCOUNTER — Encounter: Payer: Self-pay | Admitting: Family Medicine

## 2016-08-30 ENCOUNTER — Ambulatory Visit (INDEPENDENT_AMBULATORY_CARE_PROVIDER_SITE_OTHER): Payer: Self-pay | Admitting: Family Medicine

## 2016-08-30 VITALS — BP 107/76 | HR 66 | Temp 97.8°F | Resp 20 | Wt 185.5 lb

## 2016-08-30 DIAGNOSIS — E119 Type 2 diabetes mellitus without complications: Secondary | ICD-10-CM

## 2016-08-30 DIAGNOSIS — E785 Hyperlipidemia, unspecified: Secondary | ICD-10-CM

## 2016-08-30 DIAGNOSIS — I2510 Atherosclerotic heart disease of native coronary artery without angina pectoris: Secondary | ICD-10-CM

## 2016-08-30 LAB — POCT GLYCOSYLATED HEMOGLOBIN (HGB A1C): Hemoglobin A1C: 6.1

## 2016-08-30 NOTE — Progress Notes (Signed)
OFFICE VISIT  08/30/2016   CC:  Chief Complaint  Patient presents with  . Follow-up   HPI:    Patient is a 65 y.o. Caucasian male who presents for 4 mo f/u DM 2, HLD, CAD. Glucose 120 avg fasting.  As low as 86.  Nothing over 140.  Eats diabetic diet. Compliant with ASA and diabetic diet. 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  He runs on treadmill 3 times per week.   Past Medical History:  Diagnosis Date  . CAD (coronary artery disease)    CABG 2017  . Diabetes mellitus without complication (HCC)    type 2 --metformin stopped 04/2016 due to taste side effect  . Hyperlipidemia   . Kidney stones   . NSTEMI (non-ST elevated myocardial infarction) (HCC)   . Seizures (HCC)    post-traumatic (pediatric) initially, then was medicated for a few years, then was weened off med and has been seizure-free since.    Past Surgical History:  Procedure Laterality Date  . CARDIAC CATHETERIZATION N/A 12/23/2015   Procedure: Left Heart Cath and Coronary Angiography;  Surgeon: Marykay Lex, MD;  [facility] INVASIVE CV LAB;  Service: Cardiovascular;  Laterality: N/A;  any available cath attending  . CORONARY ANGIOPLASTY WITH STENT PLACEMENT     approx age 62 per pt report  . CORONARY ARTERY BYPASS GRAFT N/A 12/29/2015   Procedure: CORONARY ARTERY BYPASS GRAFTING (CABG) x four,  using left internal mammary artery and right leg greater saphenous vein harvested endoscopically;  Surgeon: [name], MD;  Location: [facility] OR;  Service: Open Heart Surgery;  Laterality: N/A;  . TEE WITHOUT CARDIOVERSION N/A 12/29/2015   Procedure: TRANSESOPHAGEAL ECHOCARDIOGRAM (TEE);  Surgeon: [name], MD;  Location: [facility] OR;  Service: Open Heart Surgery;  Laterality: N/A;    Outpatient Medications Prior to Visit  Medication Sig Dispense Refill  . aspirin 81 MG tablet Take 81 mg by mouth daily.    . simvastatin (ZOCOR) 20 MG tablet Take 1 tablet (20 mg total) by mouth daily. 30 tablet 3  .  neomycin-colistin-hydrocortisone-thonzonium (CORTISPORIN-TC) 3.02-19-09-0.5 MG/ML otic suspension Place 3 drops into the right ear 4 (four) times daily. (Patient not taking: Reported on 08/30/2016) 10 mL 0   No facility-administered medications prior to visit.     No Known Allergies  ROS As per HPI  PE: Blood pressure 107/76, pulse 66, temperature 97.8 F (36.6 C), resp. rate 20, weight 185 lb 8 oz (84.1 kg), SpO2 96 %. Gen: Alert, well appearing.  Patient is oriented to person, place, time, and situation. AFFECT: pleasant, lucid thought and speech. CV: RRR, no m/r/g.   LUNGS: CTA bilat, nonlabored resps, good aeration in all lung fields. EXT: no clubbing, cyanosis, or edema.    LABS:  Lab Results  Component Value Date   TSH 3.582 12/21/2015   Lab Results  Component Value Date   WBC 10.0 01/01/2016   HGB 11.8 (L) 01/01/2016   HCT 35.4 (L) 01/01/2016   MCV 88.9 01/01/2016   PLT 150 01/01/2016   Lab Results  Component Value Date   CREATININE 0.88 04/21/2016   BUN 15 04/21/2016   NA 140 04/21/2016   K 4.2 04/21/2016   CL 104 04/21/2016   CO2 28 04/21/2016   Lab Results  Component Value Date   ALT 29 12/21/2015   AST 22 12/21/2015   ALKPHOS 63 12/21/2015   BILITOT 0.9 12/21/2015   Lab Results  Component Value Date  CHOL 149 04/21/2016   Lab Results  Component Value Date   HDL 40.80 04/21/2016   Lab Results  Component Value Date   LDLCALC 76 04/21/2016   Lab Results  Component Value Date   TRIG 160.0 (H) 04/21/2016   Lab Results  Component Value Date   CHOLHDL 4 04/21/2016   Lab Results  Component Value Date   PSA 3.59 07/06/2010   Lab Results  Component Value Date   HGBA1C 6.5 04/21/2016   POCT HbA1c today: 6.1%  IMPRESSION AND PLAN:  1) DM 2, good control per home monitoring. POCT HbA1c today: 6.1%--excellent. He declined pneumovax and/or prevnar today, says he is not interested in these vaccines.  2) HLD: tolerating statin.  Lipids  great 4 mo ago.  We'll recheck AST/ALT at next f/u.  3) CAD: asymptomatic s/p CABG this year.  Continue ASA, statin, good glucose control, exercise.  An After Visit Summary was printed and given to the patient.  FOLLOW UP: Return in about 6 months (around 02/27/2017) for routine chronic illness f/u.  Signed:  Santiago BumpersPhil Jyaire Koudelka, MD           08/30/2016

## 2016-11-15 ENCOUNTER — Other Ambulatory Visit: Payer: Self-pay | Admitting: Family Medicine

## 2016-11-15 NOTE — Telephone Encounter (Signed)
RF request for simvastatin LOV: 08/30/16 Next ov: 09/07/17 Last written: 07/21/16 #30 w/ 3RF

## 2017-03-07 ENCOUNTER — Ambulatory Visit (INDEPENDENT_AMBULATORY_CARE_PROVIDER_SITE_OTHER): Payer: Self-pay | Admitting: Family Medicine

## 2017-03-07 ENCOUNTER — Encounter: Payer: Self-pay | Admitting: Family Medicine

## 2017-03-07 VITALS — BP 124/78 | HR 67 | Temp 98.1°F | Resp 16 | Ht 69.0 in | Wt 194.5 lb

## 2017-03-07 DIAGNOSIS — E7800 Pure hypercholesterolemia, unspecified: Secondary | ICD-10-CM

## 2017-03-07 DIAGNOSIS — E119 Type 2 diabetes mellitus without complications: Secondary | ICD-10-CM

## 2017-03-07 LAB — LIPID PANEL
Cholesterol: 183 mg/dL (ref 0–200)
HDL: 36.2 mg/dL — ABNORMAL LOW (ref >39.00)
NONHDL: 146.92
TRIGLYCERIDES: 376 mg/dL — AB (ref 0.0–149.0)
Total CHOL/HDL Ratio: 5
VLDL: 75.2 mg/dL — ABNORMAL HIGH (ref 0.0–40.0)

## 2017-03-07 LAB — COMPREHENSIVE METABOLIC PANEL
ALBUMIN: 4.4 g/dL (ref 3.5–5.2)
ALK PHOS: 72 U/L (ref 39–117)
ALT: 18 U/L (ref 0–53)
AST: 16 U/L (ref 0–37)
BUN: 17 mg/dL (ref 6–23)
CO2: 30 mEq/L (ref 19–32)
Calcium: 9.3 mg/dL (ref 8.4–10.5)
Chloride: 103 mEq/L (ref 96–112)
Creatinine, Ser: 0.94 mg/dL (ref 0.40–1.50)
GFR: 85.73 mL/min (ref >60.00)
GLUCOSE: 132 mg/dL — AB (ref 70–99)
Potassium: 4.3 mEq/L (ref 3.5–5.1)
Sodium: 140 mEq/L (ref 135–145)
TOTAL PROTEIN: 6.8 g/dL (ref 6.0–8.3)
Total Bilirubin: 0.9 mg/dL (ref 0.2–1.2)

## 2017-03-07 LAB — LDL CHOLESTEROL, DIRECT: Direct LDL: 83 mg/dL

## 2017-03-07 LAB — HEMOGLOBIN A1C: HEMOGLOBIN A1C: 6.5 % (ref 4.6–6.5)

## 2017-03-07 NOTE — Progress Notes (Signed)
Pre visit review using our clinic review tool, if applicable. No additional management support is needed unless otherwise documented below in the visit note. 

## 2017-03-07 NOTE — Progress Notes (Signed)
OFFICE VISIT  03/07/2017   CC:  Chief Complaint  Patient presents with  . Follow-up    RCI, pt is fasting.     HPI:    Patient is a 66 y.o.  male who presents for 6 mo f/u DM 2, Hyperlipidemia, hx of CAD.  DM: fastings 140 avg, 2H PP 140s avg. Eating mild diabetic diet. Exercise: 4-5 days a week, 30 min on treadmill.  No CP or SOB.  Takes simvastatin daily, no side effects.  ROS: some hand joint pain, without erythema or warmth.  Goes and comes.  Worse with using hands a lot.  Past Medical History:  Diagnosis Date  . CAD (coronary artery disease)    CABG 2017  . Diabetes mellitus without complication (HCC)    type 2 --metformin stopped 04/2016 due to taste side effect  . Hyperlipidemia   . Kidney stones   . NSTEMI (non-ST elevated myocardial infarction) (HCC)   . Seizures (HCC)    post-traumatic (pediatric) initially, then was medicated for a few years, then was weened off med and has been seizure-free since.    Past Surgical History:  Procedure Laterality Date  . CARDIAC CATHETERIZATION N/A 12/23/2015   Procedure: Left Heart Cath and Coronary Angiography;  Surgeon: Marykay Lexavid W Harding, MD;  [facility];  Service: Cardiovascular;  Laterality: N/A;  any available cath attending  . CORONARY ANGIOPLASTY WITH STENT PLACEMENT     approx age 66 per pt report  . CORONARY ARTERY BYPASS GRAFT N/A 12/29/2015   Procedure: CORONARY ARTERY BYPASS GRAFTING (CABG) x four,  using left internal mammary artery and right leg greater saphenous vein harvested endoscopically;  Surgeon: Kerin PernaPeter Van Trigt, MD;  [facility];  Service: Open Heart Surgery;  Laterality: N/A;  . TEE WITHOUT CARDIOVERSION N/A 12/29/2015   Procedure: TRANSESOPHAGEAL ECHOCARDIOGRAM (TEE);  Surgeon: Kerin PernaPeter Van Trigt, MD;  [facility];  Service: Open Heart Surgery;  Laterality: N/A;    Outpatient Medications Prior to Visit  Medication Sig Dispense Refill  . aspirin 81 MG tablet Take 81 mg by mouth daily.     . simvastatin (ZOCOR) 20 MG tablet TAKE 1 TABLET BY MOUTH EVERY DAY 30 tablet 11  . neomycin-colistin-hydrocortisone-thonzonium (CORTISPORIN-TC) 3.02-19-09-0.5 MG/ML otic suspension Place 3 drops into the right ear 4 (four) times daily. (Patient not taking: Reported on 08/30/2016) 10 mL 0   No facility-administered medications prior to visit.     No Known Allergies  ROS As per HPI  PE: Blood pressure 124/78, pulse 67, temperature 98.1 F (36.7 C), temperature source Oral, resp. rate 16, height 5\' 9"  (1.753 m), weight 194 lb 8 oz (88.2 kg), SpO2 96 %. Gen: Alert, well appearing.  Patient is oriented to person, place, time, and situation. CV: RRR, no m/r/g.   LUNGS: CTA bilat, nonlabored resps, good aeration in all lung fields. EXT: no clubbing, cyanosis, or edema.  Hands: no erythema, warmth, or tenderness.  Mild diffuse joint hypertrophy.  Full ROM of all hand/finger joints.  LABS:  Lab Results  Component Value Date   TSH 3.582 12/21/2015   Lab Results  Component Value Date   WBC 10.0 01/01/2016   HGB 11.8 (L) 01/01/2016   HCT 35.4 (L) 01/01/2016   MCV 88.9 01/01/2016   PLT 150 01/01/2016   Lab Results  Component Value Date   CREATININE 0.88 04/21/2016   BUN 15 04/21/2016   NA 140 04/21/2016   K 4.2 04/21/2016   CL 104 04/21/2016  CO2 28 04/21/2016   Lab Results  Component Value Date   ALT 29 12/21/2015   AST 22 12/21/2015   ALKPHOS 63 12/21/2015   BILITOT 0.9 12/21/2015   Lab Results  Component Value Date   CHOL 149 04/21/2016   Lab Results  Component Value Date   HDL 40.80 04/21/2016   Lab Results  Component Value Date   LDLCALC 76 04/21/2016   Lab Results  Component Value Date   TRIG 160.0 (H) 04/21/2016   Lab Results  Component Value Date   CHOLHDL 4 04/21/2016   Lab Results  Component Value Date   PSA 3.59 07/06/2010   Lab Results  Component Value Date   HGBA1C 6.1 08/30/2016    IMPRESSION AND PLAN:  1) DM 2, diet  controlled. Control good. HbA1c today.  CMET today.  2) HLD: tolerating statin. FLP today, CMET today.  3) CAD: stable.  4) Osteoarthritis, hands: pt does not want to take meds for this.  Reassured him.  An After Visit Summary was printed and given to the patient.  FOLLOW UP: No Follow-up on file.  Signed:  Santiago Bumpers, MD           03/07/2017

## 2017-03-08 ENCOUNTER — Encounter: Payer: Self-pay | Admitting: Family Medicine

## 2017-10-30 ENCOUNTER — Other Ambulatory Visit: Payer: Self-pay | Admitting: Family Medicine

## 2018-05-03 ENCOUNTER — Other Ambulatory Visit: Payer: Self-pay | Admitting: Family Medicine

## 2018-05-03 NOTE — Telephone Encounter (Signed)
Pt advised and voiced understanding. He stated that he will call back to schedule an apt. 

## 2018-05-03 NOTE — Telephone Encounter (Signed)
Pt is over due for f/u RCI, needs office visit for more refills, will send Rx for #30 w/ 0RF.

## 2018-06-03 ENCOUNTER — Other Ambulatory Visit: Payer: Self-pay | Admitting: Family Medicine

## 2018-06-05 ENCOUNTER — Ambulatory Visit: Payer: Self-pay | Admitting: Family Medicine

## 2018-06-12 ENCOUNTER — Encounter: Payer: Self-pay | Admitting: Family Medicine

## 2018-06-12 ENCOUNTER — Ambulatory Visit: Payer: Self-pay | Admitting: Family Medicine

## 2018-06-12 VITALS — BP 116/70 | HR 58 | Temp 98.1°F | Resp 16 | Ht 69.0 in | Wt 192.0 lb

## 2018-06-12 DIAGNOSIS — I2510 Atherosclerotic heart disease of native coronary artery without angina pectoris: Secondary | ICD-10-CM

## 2018-06-12 DIAGNOSIS — E663 Overweight: Secondary | ICD-10-CM

## 2018-06-12 DIAGNOSIS — E118 Type 2 diabetes mellitus with unspecified complications: Secondary | ICD-10-CM

## 2018-06-12 DIAGNOSIS — E7800 Pure hypercholesterolemia, unspecified: Secondary | ICD-10-CM

## 2018-06-12 LAB — LIPID PANEL
CHOL/HDL RATIO: 5
Cholesterol: 178 mg/dL (ref 0–200)
HDL: 37.2 mg/dL — ABNORMAL LOW (ref >39.00)
NONHDL: 140.6
Triglycerides: 321 mg/dL — ABNORMAL HIGH (ref 0.0–149.0)
VLDL: 64.2 mg/dL — ABNORMAL HIGH (ref 0.0–40.0)

## 2018-06-12 LAB — COMPREHENSIVE METABOLIC PANEL
ALK PHOS: 64 U/L (ref 39–117)
ALT: 17 U/L (ref 0–53)
AST: 15 U/L (ref 0–37)
Albumin: 4.5 g/dL (ref 3.5–5.2)
BILIRUBIN TOTAL: 0.7 mg/dL (ref 0.2–1.2)
BUN: 17 mg/dL (ref 6–23)
CO2: 27 meq/L (ref 19–32)
Calcium: 9.4 mg/dL (ref 8.4–10.5)
Chloride: 102 mEq/L (ref 96–112)
Creatinine, Ser: 0.96 mg/dL (ref 0.40–1.50)
GFR: 83.34 mL/min (ref >60.00)
Glucose, Bld: 132 mg/dL — ABNORMAL HIGH (ref 70–99)
Potassium: 4.1 mEq/L (ref 3.5–5.1)
Sodium: 137 mEq/L (ref 135–145)
TOTAL PROTEIN: 7.1 g/dL (ref 6.0–8.3)

## 2018-06-12 LAB — MICROALBUMIN / CREATININE URINE RATIO
CREATININE, U: 169.3 mg/dL
MICROALB UR: 1.2 mg/dL (ref 0.0–1.9)
MICROALB/CREAT RATIO: 0.7 mg/g (ref 0.0–30.0)

## 2018-06-12 LAB — LDL CHOLESTEROL, DIRECT: Direct LDL: 92 mg/dL

## 2018-06-12 LAB — HEMOGLOBIN A1C: Hgb A1c MFr Bld: 6.5 % (ref 4.6–6.5)

## 2018-06-12 NOTE — Progress Notes (Signed)
OFFICE VISIT  06/12/2018   CC:  Chief Complaint  Patient presents with  . Follow-up    RCI, pt is fasting.      HPI:    Patient is a 67 y.o. Caucasian male who presents for f/u DM 2, HLD, and CAD with hx of NSTEMI and CABG (2017). I have not seen him in 15 months.  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 Diet: "mediocre" with regard to paying attention to carbs/fats. Drinks lots of water.  DM 2: 100-128 fasting and PP. No burning or tingling or numbness in feet.  No vision complaints.  ROS: no CP, no SOB, No LE swelling, no melena, no dizziness or palpitations.  No myalgias.  Past Medical History:  Diagnosis Date  . CAD (coronary artery disease)    CABG 2017  . Diabetes mellitus without complication (HCC)    type 2 --metformin stopped 04/2016 due to taste side effect  . Kidney stones   . Mixed hyperlipidemia   . NSTEMI (non-ST elevated myocardial infarction) (HCC)   . Seizures (HCC)    post-traumatic (pediatric) initially, then was medicated for a few years, then was weened off med and has been seizure-free since.    Past Surgical History:  Procedure Laterality Date  . CARDIAC CATHETERIZATION N/A 12/23/2015   Procedure: Left Heart Cath and Coronary Angiography;  Surgeon: Marykay Lexavid W Harding, MD;  [facility];  Service: Cardiovascular;  Laterality: N/A;  any available cath attending  . CORONARY ANGIOPLASTY WITH STENT PLACEMENT     approx age 67 per pt report  . CORONARY ARTERY BYPASS GRAFT N/A 12/29/2015   Procedure: CORONARY ARTERY BYPASS GRAFTING (CABG) x four,  using left internal mammary artery and right leg greater saphenous vein harvested endoscopically;  Surgeon: Kerin PernaPeter Van Trigt, MD;  [facility];  Service: Open Heart Surgery;  Laterality: N/A;  . TEE WITHOUT CARDIOVERSION N/A 12/29/2015   Procedure: TRANSESOPHAGEAL ECHOCARDIOGRAM (TEE);  Surgeon: Kerin PernaPeter Van Trigt, MD;  [facility];   Service: Open Heart Surgery;  Laterality: N/A;    Outpatient Medications Prior to Visit  Medication Sig Dispense Refill  . aspirin 81 MG tablet Take 81 mg by mouth daily.    . simvastatin (ZOCOR) 20 MG tablet TAKE 1 TABLET BY MOUTH EVERY DAY 30 tablet 0   No facility-administered medications prior to visit.     No Known Allergies  ROS As per HPI  PE: Blood pressure 116/70, pulse (!) 58, temperature 98.1 F (36.7 C), temperature source Oral, resp. rate 16, height 5\' 9"  (1.753 m), weight 192 lb (87.1 kg), SpO2 93 %. Body mass index is 28.35 kg/m.  Gen: Alert, well appearing.  Patient is oriented to person, place, time, and situation. AFFECT: pleasant, lucid thought and speech. Neck: supple/nontender.  No LAD, mass, or TM.  Carotid pulses 2+ bilaterally, without bruits.  CV: RRR, no m/r/g.   LUNGS: CTA bilat, nonlabored resps, good aeration in all lung fields. EXT: no clubbing, cyanosis, or edema.  Foot exam - both normal; no swelling, tenderness or skin or vascular lesions. Color and temperature is normal. Sensation is intact. Peripheral pulses are palpable. Toenails are normal.   LABS:  Lab Results  Component Value Date   TSH 3.582 12/21/2015   Lab Results  Component Value Date   WBC 10.0 01/01/2016   HGB 11.8 (L) 01/01/2016   HCT 35.4 (L) 01/01/2016   MCV 88.9 01/01/2016  PLT 150 01/01/2016   Lab Results  Component Value Date   CREATININE 0.94 03/07/2017   BUN 17 03/07/2017   NA 140 03/07/2017   K 4.3 03/07/2017   CL 103 03/07/2017   CO2 30 03/07/2017   Lab Results  Component Value Date   ALT 18 03/07/2017   AST 16 03/07/2017   ALKPHOS 72 03/07/2017   BILITOT 0.9 03/07/2017   Lab Results  Component Value Date   CHOL 183 03/07/2017   Lab Results  Component Value Date   HDL 36.20 (L) 03/07/2017   Lab Results  Component Value Date   LDLCALC 76 04/21/2016   Lab Results  Component Value Date   TRIG 376.0 (H) 03/07/2017   Lab Results  Component  Value Date   CHOLHDL 5 03/07/2017   Lab Results  Component Value Date   PSA 3.59 07/06/2010   Lab Results  Component Value Date   HGBA1C 6.5 03/07/2017    IMPRESSION AND PLAN:  1) DM 2, good control on diet alone. HbA1c, urine microalbumin, and feet exam today. I ordered referral for diab retpthy screening --optometrist. Lytes/cr today. Pneumovax--Pt declines.  2) HLD: tolerating statin.  FLP and hepatic panel today.  3) CAD: asymptomatic, doing great. ASA and statin.  An After Visit Summary was printed and given to the patient.  FOLLOW UP: Return in about 6 months (around 12/12/2018) for routine chronic illness f/u.  Signed:  Santiago Bumpers, MD           06/12/2018

## 2018-06-13 ENCOUNTER — Other Ambulatory Visit: Payer: Self-pay | Admitting: *Deleted

## 2018-06-13 MED ORDER — ATORVASTATIN CALCIUM 40 MG PO TABS: 40.0000 mg | ORAL_TABLET | Freq: Every day | ORAL | 2 refills | Status: DC

## 2018-07-04 ENCOUNTER — Other Ambulatory Visit: Payer: Self-pay | Admitting: Family Medicine

## 2018-07-19 ENCOUNTER — Other Ambulatory Visit: Payer: Self-pay

## 2018-07-19 ENCOUNTER — Observation Stay (HOSPITAL_COMMUNITY): Payer: Self-pay | Admitting: Anesthesiology

## 2018-07-19 ENCOUNTER — Encounter (HOSPITAL_COMMUNITY): Payer: Self-pay

## 2018-07-19 ENCOUNTER — Encounter (HOSPITAL_COMMUNITY)
Admission: EM | Disposition: A | Discharge status: Home / Self Care | Payer: Self-pay | Source: Home / Self Care | Attending: Emergency Medicine

## 2018-07-19 ENCOUNTER — Emergency Department (HOSPITAL_COMMUNITY): Payer: Self-pay

## 2018-07-19 ENCOUNTER — Observation Stay (HOSPITAL_COMMUNITY)
Admission: EM | Admit: 2018-07-19 | Discharge: 2018-07-20 | Disposition: A | Discharge status: Home / Self Care | Payer: Self-pay

## 2018-07-19 DIAGNOSIS — I10 Essential (primary) hypertension: Secondary | ICD-10-CM | POA: Insufficient documentation

## 2018-07-19 DIAGNOSIS — Z951 Presence of aortocoronary bypass graft: Secondary | ICD-10-CM | POA: Insufficient documentation

## 2018-07-19 DIAGNOSIS — K4030 Unilateral inguinal hernia, with obstruction, without gangrene, not specified as recurrent: Principal | ICD-10-CM | POA: Insufficient documentation

## 2018-07-19 DIAGNOSIS — H918X9 Other specified hearing loss, unspecified ear: Secondary | ICD-10-CM | POA: Insufficient documentation

## 2018-07-19 DIAGNOSIS — E782 Mixed hyperlipidemia: Secondary | ICD-10-CM | POA: Insufficient documentation

## 2018-07-19 DIAGNOSIS — I252 Old myocardial infarction: Secondary | ICD-10-CM | POA: Insufficient documentation

## 2018-07-19 DIAGNOSIS — E1151 Type 2 diabetes mellitus with diabetic peripheral angiopathy without gangrene: Secondary | ICD-10-CM | POA: Insufficient documentation

## 2018-07-19 DIAGNOSIS — I25810 Atherosclerosis of coronary artery bypass graft(s) without angina pectoris: Secondary | ICD-10-CM | POA: Insufficient documentation

## 2018-07-19 DIAGNOSIS — Z7982 Long term (current) use of aspirin: Secondary | ICD-10-CM | POA: Insufficient documentation

## 2018-07-19 DIAGNOSIS — Z87891 Personal history of nicotine dependence: Secondary | ICD-10-CM | POA: Insufficient documentation

## 2018-07-19 DIAGNOSIS — K4090 Unilateral inguinal hernia, without obstruction or gangrene, not specified as recurrent: Secondary | ICD-10-CM

## 2018-07-19 HISTORY — PX: INGUINAL HERNIA REPAIR: SHX194

## 2018-07-19 HISTORY — PX: INSERTION OF MESH: SHX5868

## 2018-07-19 LAB — CBC WITH DIFFERENTIAL/PLATELET
Abs Immature Granulocytes: 0 10*3/uL (ref 0.0–0.1)
BASOS ABS: 0.1 10*3/uL (ref 0.0–0.1)
Basophils Relative: 1 %
EOS ABS: 0.3 10*3/uL (ref 0.0–0.7)
EOS PCT: 4 %
HCT: 50.8 % (ref 39.0–52.0)
Hemoglobin: 17 g/dL (ref 13.0–17.0)
Immature Granulocytes: 0 %
Lymphocytes Relative: 17 %
Lymphs Abs: 1.1 10*3/uL (ref 0.7–4.0)
MCH: 30.6 pg (ref 26.0–34.0)
MCHC: 33.5 g/dL (ref 30.0–36.0)
MCV: 91.5 fL (ref 78.0–100.0)
Monocytes Absolute: 0.6 10*3/uL (ref 0.1–1.0)
Monocytes Relative: 8 %
NEUTROS PCT: 70 %
Neutro Abs: 4.6 10*3/uL (ref 1.7–7.7)
PLATELETS: 182 10*3/uL (ref 150–400)
RBC: 5.55 MIL/uL (ref 4.22–5.81)
RDW: 12.2 % (ref 11.5–15.5)
WBC: 6.7 10*3/uL (ref 4.0–10.5)

## 2018-07-19 LAB — I-STAT CG4 LACTIC ACID, ED: LACTIC ACID, VENOUS: 1.32 mmol/L (ref 0.5–1.9)

## 2018-07-19 LAB — COMPREHENSIVE METABOLIC PANEL
ALT: 23 U/L (ref 0–44)
AST: 21 U/L (ref 15–41)
Albumin: 3.8 g/dL (ref 3.5–5.0)
Alkaline Phosphatase: 66 U/L (ref 38–126)
Anion gap: 9 (ref 5–15)
BUN: 14 mg/dL (ref 8–23)
CHLORIDE: 104 mmol/L (ref 98–111)
CO2: 27 mmol/L (ref 22–32)
CREATININE: 1 mg/dL (ref 0.61–1.24)
Calcium: 9.3 mg/dL (ref 8.9–10.3)
Glucose, Bld: 151 mg/dL — ABNORMAL HIGH (ref 70–99)
Potassium: 4.4 mmol/L (ref 3.5–5.1)
Sodium: 140 mmol/L (ref 135–145)
Total Bilirubin: 0.9 mg/dL (ref 0.3–1.2)
Total Protein: 6.6 g/dL (ref 6.5–8.1)

## 2018-07-19 LAB — GLUCOSE, CAPILLARY
Glucose-Capillary: 103 mg/dL — ABNORMAL HIGH (ref 70–99)
Glucose-Capillary: 102 mg/dL — ABNORMAL HIGH (ref 70–99)

## 2018-07-19 LAB — TYPE AND SCREEN
ABO/RH(D): O POS
Antibody Screen: NEGATIVE

## 2018-07-19 SURGERY — REPAIR, HERNIA, INGUINAL, ADULT
Anesthesia: General | Site: Inguinal | Laterality: Right

## 2018-07-19 MED ORDER — MORPHINE SULFATE (PF) 4 MG/ML IV SOLN: 1.0000 mg | INTRAVENOUS | Status: DC | PRN

## 2018-07-19 MED ORDER — SUFENTANIL CITRATE 50 MCG/ML IV SOLN
INTRAVENOUS | Status: DC | PRN
  Administered 2018-07-19 (×2): 5 ug via INTRAVENOUS
  Administered 2018-07-19: 10 ug via INTRAVENOUS

## 2018-07-19 MED ORDER — SODIUM CHLORIDE 0.9 % IV SOLN
2.0000 g | INTRAVENOUS | Status: AC
  Administered 2018-07-19: 2 g via INTRAVENOUS
  Filled 2018-07-19: qty 2

## 2018-07-19 MED ORDER — EPHEDRINE SULFATE 50 MG/ML IJ SOLN
INTRAMUSCULAR | Status: DC | PRN
  Administered 2018-07-19: 10 mg via INTRAVENOUS

## 2018-07-19 MED ORDER — TRAMADOL HCL 50 MG PO TABS: 50.0000 mg | ORAL_TABLET | Freq: Four times a day (QID) | ORAL | Status: DC | PRN

## 2018-07-19 MED ORDER — DIPHENHYDRAMINE HCL 50 MG/ML IJ SOLN: 12.5000 mg | Freq: Four times a day (QID) | INTRAMUSCULAR | Status: DC | PRN

## 2018-07-19 MED ORDER — GLYCOPYRROLATE PF 0.2 MG/ML IJ SOSY
PREFILLED_SYRINGE | INTRAMUSCULAR | Status: AC
  Filled 2018-07-19: qty 1

## 2018-07-19 MED ORDER — DEXAMETHASONE SODIUM PHOSPHATE 10 MG/ML IJ SOLN
INTRAMUSCULAR | Status: AC
  Filled 2018-07-19: qty 1

## 2018-07-19 MED ORDER — 0.9 % SODIUM CHLORIDE (POUR BTL) OPTIME
TOPICAL | Status: DC | PRN
  Administered 2018-07-19: 1000 mL

## 2018-07-19 MED ORDER — IBUPROFEN 600 MG PO TABS: 600.0000 mg | ORAL_TABLET | Freq: Four times a day (QID) | ORAL | Status: DC | PRN

## 2018-07-19 MED ORDER — ONDANSETRON HCL 4 MG/2ML IJ SOLN
INTRAMUSCULAR | Status: DC | PRN
  Administered 2018-07-19: 4 mg via INTRAVENOUS

## 2018-07-19 MED ORDER — ONDANSETRON HCL 4 MG/2ML IJ SOLN
INTRAMUSCULAR | Status: AC
  Filled 2018-07-19: qty 2

## 2018-07-19 MED ORDER — SUGAMMADEX SODIUM 200 MG/2ML IV SOLN
INTRAVENOUS | Status: DC | PRN
  Administered 2018-07-19: 200 mg via INTRAVENOUS

## 2018-07-19 MED ORDER — KCL IN DEXTROSE-NACL 20-5-0.45 MEQ/L-%-% IV SOLN
INTRAVENOUS | Status: DC
  Filled 2018-07-19: qty 1000

## 2018-07-19 MED ORDER — ONDANSETRON HCL 4 MG/2ML IJ SOLN
4.0000 mg | Freq: Once | INTRAMUSCULAR | Status: AC
  Administered 2018-07-19: 4 mg via INTRAVENOUS
  Filled 2018-07-19: qty 2

## 2018-07-19 MED ORDER — HYDROMORPHONE HCL 1 MG/ML IJ SOLN: 0.5000 mg | INTRAMUSCULAR | Status: DC | PRN

## 2018-07-19 MED ORDER — ONDANSETRON HCL 4 MG/2ML IJ SOLN: 4.0000 mg | Freq: Four times a day (QID) | INTRAMUSCULAR | Status: DC | PRN

## 2018-07-19 MED ORDER — PROPOFOL 10 MG/ML IV BOLUS
INTRAVENOUS | Status: AC
  Filled 2018-07-19: qty 20

## 2018-07-19 MED ORDER — SUFENTANIL CITRATE 50 MCG/ML IV SOLN
INTRAVENOUS | Status: AC
  Filled 2018-07-19: qty 1

## 2018-07-19 MED ORDER — SODIUM CHLORIDE 0.9 % IJ SOLN
INTRAMUSCULAR | Status: AC
  Filled 2018-07-19: qty 10

## 2018-07-19 MED ORDER — PROPOFOL 10 MG/ML IV BOLUS
INTRAVENOUS | Status: DC | PRN
  Administered 2018-07-19: 100 mg via INTRAVENOUS
  Administered 2018-07-19: 50 mg via INTRAVENOUS

## 2018-07-19 MED ORDER — EPHEDRINE 5 MG/ML INJ
INTRAVENOUS | Status: AC
  Filled 2018-07-19: qty 10

## 2018-07-19 MED ORDER — LIDOCAINE HCL (CARDIAC) PF 100 MG/5ML IV SOSY
PREFILLED_SYRINGE | INTRAVENOUS | Status: DC | PRN
  Administered 2018-07-19: 100 mg via INTRAVENOUS

## 2018-07-19 MED ORDER — DIPHENHYDRAMINE HCL 12.5 MG/5ML PO ELIX: 12.5000 mg | ORAL_SOLUTION | Freq: Four times a day (QID) | ORAL | Status: DC | PRN

## 2018-07-19 MED ORDER — LIDOCAINE HCL 1 % IJ SOLN
INTRAMUSCULAR | Status: DC | PRN
  Administered 2018-07-19: 20 mL

## 2018-07-19 MED ORDER — LACTATED RINGERS IV SOLN
INTRAVENOUS | Status: DC
  Administered 2018-07-19 (×2): via INTRAVENOUS

## 2018-07-19 MED ORDER — GLYCOPYRROLATE 0.2 MG/ML IJ SOLN
INTRAMUSCULAR | Status: DC | PRN
  Administered 2018-07-19: 0.2 mg via INTRAVENOUS

## 2018-07-19 MED ORDER — BUPIVACAINE-EPINEPHRINE (PF) 0.5% -1:200000 IJ SOLN
INTRAMUSCULAR | Status: AC
  Filled 2018-07-19: qty 30

## 2018-07-19 MED ORDER — MIDAZOLAM HCL 2 MG/2ML IJ SOLN
INTRAMUSCULAR | Status: AC
  Filled 2018-07-19: qty 2

## 2018-07-19 MED ORDER — ROCURONIUM BROMIDE 10 MG/ML (PF) SYRINGE
PREFILLED_SYRINGE | INTRAVENOUS | Status: AC
  Filled 2018-07-19: qty 10

## 2018-07-19 MED ORDER — ROCURONIUM 10MG/ML (10ML) SYRINGE FOR MEDFUSION PUMP - OPTIME
INTRAVENOUS | Status: DC | PRN
  Administered 2018-07-19: 50 mg via INTRAVENOUS

## 2018-07-19 MED ORDER — ACETAMINOPHEN 325 MG PO TABS
650.0000 mg | ORAL_TABLET | Freq: Four times a day (QID) | ORAL | Status: DC
  Administered 2018-07-19 – 2018-07-20 (×2): 650 mg via ORAL
  Filled 2018-07-19 (×2): qty 2

## 2018-07-19 MED ORDER — ONDANSETRON 4 MG PO TBDP: 4.0000 mg | ORAL_TABLET | Freq: Four times a day (QID) | ORAL | Status: DC | PRN

## 2018-07-19 MED ORDER — ATORVASTATIN CALCIUM 40 MG PO TABS: 40.0000 mg | ORAL_TABLET | Freq: Every day | ORAL | Status: DC

## 2018-07-19 MED ORDER — HYDRALAZINE HCL 20 MG/ML IJ SOLN: 10.0000 mg | INTRAMUSCULAR | Status: DC | PRN

## 2018-07-19 MED ORDER — HYDROMORPHONE HCL 1 MG/ML IJ SOLN
1.0000 mg | Freq: Once | INTRAMUSCULAR | Status: AC
  Administered 2018-07-19: 1 mg via INTRAVENOUS
  Filled 2018-07-19: qty 1

## 2018-07-19 MED ORDER — DEXAMETHASONE SODIUM PHOSPHATE 10 MG/ML IJ SOLN
INTRAMUSCULAR | Status: DC | PRN
Start: 2018-07-19 — End: 2018-07-19
  Administered 2018-07-19: 10 mg via INTRAVENOUS

## 2018-07-19 MED ORDER — LIDOCAINE HCL 1 % IJ SOLN
INTRAMUSCULAR | Status: AC
  Filled 2018-07-19: qty 20

## 2018-07-19 MED ORDER — MIDAZOLAM HCL 5 MG/5ML IJ SOLN
INTRAMUSCULAR | Status: DC | PRN
  Administered 2018-07-19: 2 mg via INTRAVENOUS

## 2018-07-19 MED ORDER — SUGAMMADEX SODIUM 200 MG/2ML IV SOLN
INTRAVENOUS | Status: AC
  Filled 2018-07-19: qty 2

## 2018-07-19 MED ORDER — IOHEXOL 300 MG/ML  SOLN
100.0000 mL | Freq: Once | INTRAMUSCULAR | Status: AC | PRN
  Administered 2018-07-19: 100 mL via INTRAVENOUS

## 2018-07-19 MED ORDER — HYDROMORPHONE HCL 1 MG/ML IJ SOLN: 0.2500 mg | INTRAMUSCULAR | Status: DC | PRN

## 2018-07-19 SURGICAL SUPPLY — 61 items
ADH SKN CLS APL DERMABOND .7 (GAUZE/BANDAGES/DRESSINGS) ×2
BLADE CLIPPER SURG (BLADE) IMPLANT
BLADE SURG 10 STRL SS (BLADE) ×3 IMPLANT
BLADE SURG 15 STRL LF DISP TIS (BLADE) ×2 IMPLANT
BLADE SURG 15 STRL SS (BLADE) ×3
CANISTER SUCT 3000ML PPV (MISCELLANEOUS) ×3 IMPLANT
CHLORAPREP W/TINT 26ML (MISCELLANEOUS) ×3 IMPLANT
COVER SURGICAL LIGHT HANDLE (MISCELLANEOUS) ×3 IMPLANT
DERMABOND ADVANCED (GAUZE/BANDAGES/DRESSINGS) ×1
DERMABOND ADVANCED .7 DNX12 (GAUZE/BANDAGES/DRESSINGS) ×2 IMPLANT
DRAIN PENROSE 1/2X12 LTX STRL (WOUND CARE) ×2 IMPLANT
DRAPE LAPAROSCOPIC ABDOMINAL (DRAPES) ×3 IMPLANT
DRAPE LAPAROTOMY TRNSV 102X78 (DRAPE) ×3 IMPLANT
DRAPE UTILITY XL STRL (DRAPES) ×3 IMPLANT
DRAPE WARM FLUID 44X44 (DRAPE) ×3 IMPLANT
ELECT BLADE 6.5 EXT (BLADE) IMPLANT
ELECT CAUTERY BLADE 6.4 (BLADE) ×3 IMPLANT
ELECT REM PT RETURN 9FT ADLT (ELECTROSURGICAL) ×3
ELECTRODE REM PT RTRN 9FT ADLT (ELECTROSURGICAL) ×2 IMPLANT
GLOVE BIO SURGEON STRL SZ7.5 (GLOVE) ×3 IMPLANT
GLOVE INDICATOR 8.0 STRL GRN (GLOVE) ×5 IMPLANT
GOWN STRL REUS W/ TWL LRG LVL3 (GOWN DISPOSABLE) ×2 IMPLANT
GOWN STRL REUS W/ TWL XL LVL3 (GOWN DISPOSABLE) ×2 IMPLANT
GOWN STRL REUS W/TWL LRG LVL3 (GOWN DISPOSABLE) ×3
GOWN STRL REUS W/TWL XL LVL3 (GOWN DISPOSABLE) ×3
KIT BASIN OR (CUSTOM PROCEDURE TRAY) ×3 IMPLANT
KIT TURNOVER KIT B (KITS) ×3 IMPLANT
LIGASURE IMPACT 36 18CM CVD LR (INSTRUMENTS) IMPLANT
MESH ULTRAPRO 3X6 7.6X15CM (Mesh General) ×2 IMPLANT
NDL HYPO 25GX1X1/2 BEV (NEEDLE) ×1 IMPLANT
NEEDLE HYPO 25GX1X1/2 BEV (NEEDLE) ×3 IMPLANT
NS IRRIG 1000ML POUR BTL (IV SOLUTION) ×6 IMPLANT
PACK GENERAL/GYN (CUSTOM PROCEDURE TRAY) ×3 IMPLANT
PACK SURGICAL SETUP 50X90 (CUSTOM PROCEDURE TRAY) ×3 IMPLANT
PAD ARMBOARD 7.5X6 YLW CONV (MISCELLANEOUS) ×3 IMPLANT
PENCIL BUTTON HOLSTER BLD 10FT (ELECTRODE) ×3 IMPLANT
SPECIMEN JAR LARGE (MISCELLANEOUS) IMPLANT
SPECIMEN JAR SMALL (MISCELLANEOUS) IMPLANT
SPONGE LAP 18X18 X RAY DECT (DISPOSABLE) ×3 IMPLANT
STAPLER VISISTAT 35W (STAPLE) ×3 IMPLANT
SUCTION POOLE TIP (SUCTIONS) ×3 IMPLANT
SUT ETHIBOND 0 MO6 C/R (SUTURE) ×5 IMPLANT
SUT MNCRL AB 4-0 PS2 18 (SUTURE) ×3 IMPLANT
SUT PDS AB 1 TP1 96 (SUTURE) ×6 IMPLANT
SUT SILK 2 0 SH CR/8 (SUTURE) ×3 IMPLANT
SUT SILK 2 0 TIES 10X30 (SUTURE) ×3 IMPLANT
SUT SILK 3 0 SH CR/8 (SUTURE) ×3 IMPLANT
SUT SILK 3 0 TIES 10X30 (SUTURE) ×3 IMPLANT
SUT VIC AB 2-0 CT1 27 (SUTURE) ×3
SUT VIC AB 2-0 CT1 TAPERPNT 27 (SUTURE) ×2 IMPLANT
SUT VIC AB 2-0 SH 27 (SUTURE) ×3
SUT VIC AB 2-0 SH 27XBRD (SUTURE) ×1 IMPLANT
SUT VIC AB 3-0 CT1 27 (SUTURE) ×3
SUT VIC AB 3-0 CT1 TAPERPNT 27 (SUTURE) ×2 IMPLANT
SYR BULB 3OZ (MISCELLANEOUS) ×3 IMPLANT
SYR CONTROL 10ML LL (SYRINGE) ×3 IMPLANT
TOWEL OR 17X24 6PK STRL BLUE (TOWEL DISPOSABLE) ×3 IMPLANT
TOWEL OR 17X26 10 PK STRL BLUE (TOWEL DISPOSABLE) ×3 IMPLANT
TRAY FOLEY MTR SLVR 16FR STAT (SET/KITS/TRAYS/PACK) ×3 IMPLANT
TUBE CONNECTING 12X1/4 (SUCTIONS) IMPLANT
YANKAUER SUCT BULB TIP NO VENT (SUCTIONS) IMPLANT

## 2018-07-19 NOTE — ED Notes (Signed)
Pt refuses pain medication at this time. 

## 2018-07-19 NOTE — ED Notes (Signed)
Pt denies any further pain at this time  

## 2018-07-19 NOTE — ED Provider Notes (Signed)
[facility] Provider Note   CSN: [id number] Arrival date & time: 07/19/18  [id number]     History   Chief Complaint Chief Complaint  Patient presents with  . Inguinal Hernia    HPI Adrian Foley is a 67 y.o. male.  HPI  Adrian Foley is a 67yo male with a history of type 2 diabetes, hyperlipidemia, hypertension,  STEMI, kidney stones who presents to the emergency department for evaluation of right inguinal swelling and pain.  Patient reports that he was diagnosed with a right inguinal hernia several years ago.  States that he is normally able to reduce it on his own and usually does not give him problems because he wears a hernia belt.  Reports that he woke up this morning with right groin and testicular swelling and has been unable to reduce the hernia on his own.  States that was on a lot more all day yesterday and believes that contributed to his symptoms today.  He has never been evaluated by general surgery.  States he has not passed gas today and has some nausea, no vomiting. Has not had a bowel movement todayHe denies fevers, chills, nausea/vomiting, hematochezia, abdominal pain, penile pain or discharge.  Past Medical History:  Diagnosis Date  . CAD (coronary artery disease)    CABG 2017  . Diabetes mellitus without complication (HCC)    type 2 --metformin stopped 04/2016 due to taste side effect  . Kidney stones   . Mixed hyperlipidemia   . NSTEMI (non-ST elevated myocardial infarction) (HCC)   . Seizures (HCC)    post-traumatic (pediatric) initially, then was medicated for a few years, then was weened off med and has been seizure-free since.    Patient Active Problem List   Diagnosis Date Noted  . CAD (coronary artery disease)   . Unstable angina (HCC) 12/21/2015  . CAD S/P percutaneous coronary angioplasty 12/21/2015  . Type 2 diabetes mellitus with vascular disease (HCC) 02/18/2014  . OTHER SPECIFIED FORMS OF HEARING LOSS 12/26/2008    . Dyslipidemia 09/11/2007  . CAD (coronary artery disease) of artery bypass graft 09/11/2007    Past Surgical History:  Procedure Laterality Date  . CARDIAC CATHETERIZATION N/A 12/23/2015   Procedure: Left Heart Cath and Coronary Angiography;  Surgeon: Marykay Lexavid W Harding, MD;  [facility];  Service: Cardiovascular;  Laterality: N/A;  any available cath attending  . CORONARY ANGIOPLASTY WITH STENT PLACEMENT     approx age 67 per pt report  . CORONARY ARTERY BYPASS GRAFT N/A 12/29/2015   Procedure: CORONARY ARTERY BYPASS GRAFTING (CABG) x four,  using left internal mammary artery and right leg greater saphenous vein harvested endoscopically;  Surgeon: Kerin PernaPeter Van Trigt, MD;  [facility];  Service: Open Heart Surgery;  Laterality: N/A;  . TEE WITHOUT CARDIOVERSION N/A 12/29/2015   Procedure: TRANSESOPHAGEAL ECHOCARDIOGRAM (TEE);  Surgeon: Kerin PernaPeter Van Trigt, MD;  [facility];  Service: Open Heart Surgery;  Laterality: N/A;        Home Medications    Prior to Admission medications   Medication Sig Start Date End Date Taking? Authorizing Provider  aspirin 81 MG tablet Take 81 mg by mouth daily.    [provider]  atorvastatin (LIPITOR) 40 MG tablet Take 1 tablet (40 mg total) by mouth daily. 06/13/18   McGowen, Maryjean MornPhilip H, MD    Family History Family History  Problem Relation Age of Onset  . Alcohol abuse Father   . Heart  disease Maternal Aunt   . Heart disease Maternal Uncle     Social History Social History   Tobacco Use  . Smoking status: Former Smoker    Last attempt to quit: 12/21/2007    Years since quitting: 10.5  . Smokeless tobacco: Former Neurosurgeon    Types: Chew    Quit date: 05/23/2015  Substance Use Topics  . Alcohol use: No  . Drug use: No     Allergies   Patient has no known allergies.   Review of Systems Review of Systems  Constitutional: Negative for chills and fever.  Respiratory: Negative for shortness of breath.   Cardiovascular:  Negative for chest pain.  Gastrointestinal: Positive for constipation and nausea. Negative for abdominal pain, anal bleeding, blood in stool and vomiting.  Genitourinary: Positive for scrotal swelling. Negative for difficulty urinating, discharge, dysuria, frequency and testicular pain.  Musculoskeletal: Negative for back pain.  Skin: Negative for color change.  Neurological: Negative for syncope and light-headedness.  Psychiatric/Behavioral: Negative for agitation.  All other systems reviewed and are negative.    Physical Exam Updated Vital Signs BP 105/67   Pulse (!) 50   Temp 98.4 F (36.9 C) (Oral)   Resp 16   SpO2 94%   Physical Exam  Constitutional: He is oriented to person, place, and time. He appears well-developed and well-nourished. No distress.  Non-toxic. No acute distress.   HENT:  Head: Normocephalic and atraumatic.  Mouth/Throat: Oropharynx is clear and moist.  Eyes: Pupils are equal, round, and reactive to light. Conjunctivae are normal. Right eye exhibits no discharge. Left eye exhibits no discharge.  Neck: Normal range of motion. Neck supple.  Cardiovascular: Normal rate, regular rhythm and intact distal pulses.  Pulmonary/Chest: Effort normal and breath sounds normal. No stridor. No respiratory distress. He has no wheezes. He has no rales.  Abdominal: Soft. Bowel sounds are normal. There is no tenderness.  Genitourinary:  Genitourinary Comments: Right inguinal hernia which extends into the scrotum. It is exquisitely tender to palpation and irreducible. No overlying skin changes. No lesion or erythema over the penis.   Neurological: He is alert and oriented to person, place, and time. Coordination normal.  Skin: Skin is warm and dry. Capillary refill takes less than 2 seconds. He is not diaphoretic.  Psychiatric: He has a normal mood and affect. His behavior is normal.  Nursing note and vitals reviewed.    ED Treatments / Results  Labs (all labs ordered are  listed, but only abnormal results are displayed) Labs Reviewed  COMPREHENSIVE METABOLIC PANEL - Abnormal; Notable for the following components:      Result Value   Glucose, Bld 151 (*)    All other components within normal limits  CBC WITH DIFFERENTIAL/PLATELET  HIV ANTIBODY (ROUTINE TESTING)  I-STAT CG4 LACTIC ACID, ED    EKG None  Radiology No results found.  Procedures Procedures (including critical care time)  Medications Ordered in ED Medications  HYDROmorphone (DILAUDID) injection 1 mg (1 mg Intravenous Given 07/19/18 0834)  ondansetron (ZOFRAN) injection 4 mg (4 mg Intravenous Given 07/19/18 1610)     Initial Impression / Assessment and Plan / ED Course  I have reviewed the triage vital signs and the nursing notes.  Pertinent labs & imaging results that were available during my care of the patient were reviewed by me and considered in my medical decision making (see chart for details).     Patient presents with incarcerated right inguinal hernia.  Reports that he has  but not been able to pass gas today and has some nausea.  On exam there is no overlying erythema of the skin.  No guarding, rebound or rigidity.  Lactic acid within normal limits.  Do not suspect strangulation at this point.  Discussed this patient with general surgery who plans to come and evaluate the patient in the ED.  Discussed this patient with General Surgery PA Hosie Spangle who saw the patient and plans to admit patient for urgent hernia repair with possible mesh.  Patient will be kept n.p.o.  This was a shared visit with Dr. Rush Landmark who also saw the patient and agrees with plan.   Final Clinical Impressions(s) / ED Diagnoses   Final diagnoses:  None    ED Discharge Orders    None       Kellie Shropshire, PA-C 07/19/18 1326    Tegeler, Canary Brim, MD 07/20/18 [phone]

## 2018-07-19 NOTE — ED Triage Notes (Signed)
Pt states that he has a hernia in the R groin area that has been there for 2-3 months, painful over the past week, worse today and bigger.

## 2018-07-19 NOTE — H&P (Addendum)
[facility] Admission Note  Adrian Foley 1951/05/18  [id number].    Requesting MD: Tegeler, MD Chief Complaint/Reason for Consult: incarcerated inguinal hernia   HPI:  [name] is a 67 y/o male with PMH CAD, NSTEMI 2017 s/p CABGx4, and right inguinal hernia who presented to [facility] with a cc painful R inguinal hernia. States that he has known about his hernia for years but it has never caused him trouble and usually reduces. He wears a hernia belt. Today he states right groin pain woke him up from his sleep. Some radiation of pain to right testicle. Denies nausea, vomiting. Last BM was yesterday afternoon and was normal. Has not had flatus today. Denies chest pain, SOB, orthopnea, or DOE. Works as a Games developer and walks regularly. NKDA. States he is a former smoker and drinks one alocholic beverage monthly. Denies illicit drug use. Last took his 325 mg ASA this morning at 0700. Denies use of plavix or other blood thinners.   ROS: Review of Systems  Constitutional: Negative for chills and fever.  Gastrointestinal: Positive for abdominal pain (groin). Negative for vomiting.  All other systems reviewed and are negative.   Family History  Problem Relation Age of Onset  . Alcohol abuse Father   . Heart disease Maternal Aunt   . Heart disease Maternal Uncle     Past Medical History:  Diagnosis Date  . CAD (coronary artery disease)    CABG 2017  . Diabetes mellitus without complication ([place])    type 2 --metformin stopped 04/2016 due to taste side effect  . Kidney stones   . Mixed hyperlipidemia   . NSTEMI (non-ST elevated myocardial infarction) ([place])   . Seizures ([place])    post-traumatic (pediatric) initially, then was medicated for a few years, then was weened off med and has been seizure-free since.    Past Surgical History:  Procedure Laterality Date  . CARDIAC CATHETERIZATION N/A 12/23/2015   Procedure: Left Heart Cath and Coronary Angiography;  Surgeon: Leonie Man,  MD;  Location: [place] CV LAB;  Service: Cardiovascular;  Laterality: N/A;  any available cath attending  . CORONARY ANGIOPLASTY WITH STENT PLACEMENT     approx age 52 per pt report  . CORONARY ARTERY BYPASS GRAFT N/A 12/29/2015   Procedure: CORONARY ARTERY BYPASS GRAFTING (CABG) x four,  using left internal mammary artery and right leg greater saphenous vein harvested endoscopically;  Surgeon: Ivin Poot, MD;  Location: [place];  Service: Open Heart Surgery;  Laterality: N/A;  . TEE WITHOUT CARDIOVERSION N/A 12/29/2015   Procedure: TRANSESOPHAGEAL ECHOCARDIOGRAM (TEE);  Surgeon: Ivin Poot, MD;  Location: [place];  Service: Open Heart Surgery;  Laterality: N/A;    Social History:  reports that he quit smoking about 10 years ago. He quit smokeless tobacco use about 3 years ago. His smokeless tobacco use included chew. He reports that he does not drink alcohol or use drugs.  Allergies: No Known Allergies   (Not in a hospital admission)  Blood pressure 131/90, pulse (!) 52, temperature 98.4 F (36.9 C), temperature source Oral, resp. rate 16, SpO2 94 %. Physical Exam: Physical Exam  Constitutional: He is oriented to person, place, and time. He appears well-developed and well-nourished. No distress.  HENT:  Head: Normocephalic and atraumatic.  Right Ear: External ear normal.  Left Ear: External ear normal.  Eyes: Pupils are equal, round, and reactive to light. EOM are normal. Right eye exhibits no discharge. Left eye exhibits no discharge. No  scleral icterus.  Neck: Normal range of motion. Neck supple. No tracheal deviation present. No thyromegaly present.  Cardiovascular: Normal rate, regular rhythm, normal heart sounds and intact distal pulses. Exam reveals no friction rub.  No murmur heard. Blanching erythema chest wall s/p recent administration of dilaudid.   Pulmonary/Chest: Effort normal and breath sounds normal. No stridor. No respiratory distress. He has no wheezes. He has no  rales.  Abdominal: Soft. Bowel sounds are normal. He exhibits no distension. There is no tenderness. There is no guarding. A hernia (R inguinal hernia, not reducible, no overylying skin changes) is present.  Musculoskeletal: Normal range of motion. He exhibits no edema, tenderness or deformity.  Neurological: He is alert and oriented to person, place, and time. No sensory deficit.  Skin: Skin is warm and dry. Rash noted. He is not diaphoretic.  Psychiatric: He has a normal mood and affect. His behavior is normal.   Results for orders placed or performed during the hospital encounter of 07/19/18 (from the past 48 hour(s))  CBC with Differential     Status: None   Collection Time: 07/19/18  8:23 AM  Result Value Ref Range   WBC 6.7 4.0 - 10.5 K/uL   RBC 5.55 4.22 - 5.81 MIL/uL   Hemoglobin 17.0 13.0 - 17.0 g/dL   HCT 50.8 39.0 - 52.0 %   MCV 91.5 78.0 - 100.0 fL   MCH 30.6 26.0 - 34.0 pg   MCHC 33.5 30.0 - 36.0 g/dL   RDW 12.2 11.5 - 15.5 %   Platelets 182 150 - 400 K/uL   Neutrophils Relative % 70 %   Neutro Abs 4.6 1.7 - 7.7 K/uL   Lymphocytes Relative 17 %   Lymphs Abs 1.1 0.7 - 4.0 K/uL   Monocytes Relative 8 %   Monocytes Absolute 0.6 0.1 - 1.0 K/uL   Eosinophils Relative 4 %   Eosinophils Absolute 0.3 0.0 - 0.7 K/uL   Basophils Relative 1 %   Basophils Absolute 0.1 0.0 - 0.1 K/uL   Immature Granulocytes 0 %   Abs Immature Granulocytes 0.0 0.0 - 0.1 K/uL    Comment: Performed at [facility], [address] [place], [place] [address]  Comprehensive metabolic panel     Status: Abnormal   Collection Time: 07/19/18  8:23 AM  Result Value Ref Range   Sodium 140 135 - 145 mmol/L   Potassium 4.4 3.5 - 5.1 mmol/L   Chloride 104 98 - 111 mmol/L   CO2 27 22 - 32 mmol/L   Glucose, Bld 151 (H) 70 - 99 mg/dL   BUN 14 8 - 23 mg/dL   Creatinine, Ser 1.00 0.61 - 1.24 mg/dL   Calcium 9.3 8.9 - 10.3 mg/dL   Total Protein 6.6 6.5 - 8.1 g/dL   Albumin 3.8 3.5 - 5.0 g/dL   AST 21  15 - 41 U/L   ALT 23 0 - 44 U/L   Alkaline Phosphatase 66 38 - 126 U/L   Total Bilirubin 0.9 0.3 - 1.2 mg/dL   GFR calc non Af Amer >60 >60 mL/min   GFR calc Af Amer >60 >60 mL/min    Comment: (NOTE) The eGFR has been calculated using the CKD EPI equation. This calculation has not been validated in all clinical situations. eGFR's persistently <60 mL/min signify possible Chronic Kidney Disease.    Anion gap 9 5 - 15    Comment: Performed at [facility] [address] [place], [place]  [address]  I-Stat CG4 Lactic Acid, ED     Status: None   Collection Time: 07/19/18  8:29 AM  Result Value Ref Range   Lactic Acid, Venous 1.32 0.5 - 1.9 mmol/L   No results found.  Assessment/Plan Blanching skin rash - suspect drug eruption, stop dilaudid, benadryl PRN CAD  NSTEMI s/p CABGx4 2017 - ASA 325 mg, last dose 0700 7/31  Incarcerated R inguinal hernia  - CT abd/pelvis pending - afebrile, labs WNL  - NPO, IVF  - pain control  - patient will need urgent hernia repair - discussed possible bowel resection if blood flow to incarcerated bowel is compromised, discussed primary repair and possible mesh placement.  Jill Alexanders, [facility] Surgery 07/19/2018, 12:41 PM Pager: [phone] Consults: [phone] Mon-Fri 7:00 am-4:30 pm Sat-Sun 7:00 am-11:30 am

## 2018-07-19 NOTE — Anesthesia Preprocedure Evaluation (Signed)
Anesthesia Evaluation  Patient identified by MRN, date of birth, ID band Patient awake    Reviewed: Allergy & Precautions, NPO status , [name] Chart, lab work & pertinent test results  Airway Mallampati: II  TM Distance: >3 FB     Dental   Pulmonary former smoker,    breath sounds clear to auscultation       Cardiovascular + angina + CAD, + Past MI and + Peripheral Vascular Disease   Rhythm:Regular Rate:Normal     Neuro/Psych    GI/Hepatic negative GI ROS, Neg liver ROS,   Endo/Other  diabetes  Renal/GU Renal disease     Musculoskeletal   Abdominal   Peds  Hematology   Anesthesia Other Findings   Reproductive/Obstetrics                             Anesthesia Physical Anesthesia Plan  ASA: III  Anesthesia Plan: General   Post-op Pain Management:    Induction: Intravenous  PONV Risk Score and Plan: Ondansetron, Dexamethasone and Midazolam  Airway Management Planned:   Additional Equipment:   Intra-op Plan:   Post-operative Plan: Extubation in OR  Informed Consent: I have reviewed the patients History and Physical, chart, labs and discussed the procedure including the risks, benefits and alternatives for the proposed anesthesia with the patient or authorized representative who has indicated his/her understanding and acceptance.   Dental advisory given  Plan Discussed with: CRNA and Anesthesiologist  Anesthesia Plan Comments:         Anesthesia Quick Evaluation

## 2018-07-19 NOTE — Anesthesia Postprocedure Evaluation (Signed)
Anesthesia Post Note  Patient: Adrian Foley  Procedure(s) Performed: RIGHT HERNIA REPAIR INCARCERATED INGUINAL ADULT (Right Inguinal) INSERTION OF MESH (Right Inguinal)     Patient location during evaluation: PACU Anesthesia Type: General Level of consciousness: awake Pain management: pain level controlled Vital Signs Assessment: post-procedure vital signs reviewed and stable Respiratory status: spontaneous breathing Cardiovascular status: stable Anesthetic complications: no    Last Vitals:  Vitals:   07/19/18 1730 07/19/18 2102  BP: 117/73 119/77  Pulse: (!) 50   Resp: 18 12  Temp: 37.3 C   SpO2: 93%     Last Pain:  Vitals:   07/19/18 1740  TempSrc:   PainSc: 1                  Rainen Vanrossum

## 2018-07-19 NOTE — Op Note (Signed)
07/19/2018  8:50 PM  PATIENT:  Adrian Foley  67 y.o. male  Patient Care Team: Adrian Massed, MD as PCP - General (Family Medicine)  PRE-OPERATIVE DIAGNOSIS:  Incarcerated right inguinal hernia  POST-OPERATIVE DIAGNOSIS:  Incarcerated right inguinal hernia, indirect  PROCEDURE:  Open right inguinal hernia repair with mesh  SURGEON:  Adrian Coup. Marsa Matteo, MD  ASSISTANT: Scrub nurse  ANESTHESIA:  General endotracheal  COUNTS:  Sponge, needle and instrument counts were reported correct x2 at the conclusion of the operation.  EBL: 5cc  DRAINS: None  SPECIMEN: None  FINDINGS: Indirect right inguinal hernia containing colon and mesocolon - all of which were clearly viable and easily reduced. Mesh was therefore used.  INDICATION: Adrian Foley is a very pleasant 66yoM with hx of CAD, NSTEMI 2017 s/p CABG x4 - presented to ED with painful right groin hernia. He has had this for many years and typically gets by with a hernia belt. Always has been reducible by his account. This time it hasn't been. Denies any n/v, just bulge that's uncomfortable. Last BM yesterday. Denies flatus. He denies any blood thinners aside from daily aspirin.   On exam, he had a nonreducible right inguinal hernia. He had previously undergone CT a/p earlier today which demonstrated a bowel containing right inguinal hernia. WBC was normal and he had no overlying skin changes. Given incarceration, options were discussed and he opted to pursue surgery. Please refer to H&P for details regarding this discussion.  DESCRIPTION: The patient was identified in preop holding and taken to the OR where they were placed supine on the operating room table and SCDs were placed. General endotracheal anesthesia was induced without difficulty. The hair along the abdomen and groin was clipped. A foley catheter was placed by the nursing staff. The patient was then prepped and draped in the usual sterile fashion. A surgical timeout was  performed indicating the correct patient, procedure, positioning and need for preoperative antibiotics.  The lower abdomen and groin was prepped with Chloraprep and draped in the standard fashion, and 0.25% Marcaine with epinephrine was used to anesthetize the skin over the mid-portion of the inguinal canal. An oblique incision was made. Dissection was carried down through the subcutaneous tissue and Scarpa's fascia with cautery to the external oblique fascia.  The external oblique fascia was opened along the direction of its fibers to the external ring.  The spermatic cord was circumferentially dissected bluntly and retracted with a Penrose drain.  The ilioinguinal nerve was identified and preserved.  The floor of the inguinal canal was inspected.  The cord structures were all identified and then dissected. The hernia sac was identified, care being taken not to skeletonize the cord this was dissected free from the cord structures. The hernia was only partially freed from the cord structures due to its size. The hernia sac was opened in an area that was empty and see through. The sac was found to contain a pink segment of sigmoid, sigmoid mesentery that was yellow and clearly viable. The contents were easily reduced into the abdomen.  High ligation of the sac was performed under direct visualization and the sac suture ligated with a 2-0 Vicryl suture. The cord was explored and no significant cord lipomas were identified. A piece of light weight prolene mesh was cut to fit the inguinal floor and deployed. The mesh was secured to the pubic tubercle using two 0 Ethibond sutures - ensuring that there was 2cm of overlap between the mesh and  the pubic tubercle. The mesh was then secured using additional 0 Ethibond suture to the shelving edge of the inguinal ligament inferiorly and the conjoined tendon superiorly. The mesh was tucked underneath the external oblique fascia laterally.  The tails of the mesh were then  closed around the spermatic cord to recreate the internal inguinal ring and this ring was secured using 0 Ethibond suture. The shutter created easily accommodated the tip of a finger so as not to apply undue constriction of the spermatic cord.  The external oblique fascia was reapproximated with 2-0 Vicryl to recreate the external ring.  3-0 Vicryl was then used to close Scarpa's fascia. A running 4-0 Monocryl subcuticular suture was used to close the skin.  Dermabond was the applied. The patient was awakened from general anesthesia, extubated and taken to the recovery in satisfactory condition.  DISPOSITION: PACU in satisfactory condition

## 2018-07-19 NOTE — Transfer of Care (Signed)
Immediate Anesthesia Transfer of Care Note  Patient: Adrian Foley  Procedure(s) Performed: RIGHT HERNIA REPAIR INCARCERATED INGUINAL ADULT (Right Inguinal) INSERTION OF MESH (Right Inguinal)  Patient Location: PACU  Anesthesia Type:General and GA combined with regional for post-op pain  Level of Consciousness: awake, alert , oriented and patient cooperative  Airway & Oxygen Therapy: Patient Spontanous Breathing  Post-op Assessment: Report given to RN, Post -op Vital signs reviewed and stable and Patient moving all extremities X 4  Post vital signs: Reviewed and stable  Last Vitals:  Vitals Value Taken Time  BP    Temp    Pulse 68 07/19/2018  9:01 PM  Resp 9 07/19/2018  9:01 PM  SpO2 96 % 07/19/2018  9:01 PM  Vitals shown include unvalidated device data.  Last Pain:  Vitals:   07/19/18 1740  TempSrc:   PainSc: 1          Complications: No apparent anesthesia complications

## 2018-07-19 NOTE — ED Notes (Signed)
Pt states he feels area has swollen more. Swelling now noted at right and left scrotal area. Same reported to Darl PikesSusan of Short stay.

## 2018-07-19 NOTE — ED Notes (Signed)
Swelling noted at right groin and right scrotal area.

## 2018-07-19 NOTE — Progress Notes (Addendum)
Subjective H&P reviewed by myself - note from today by our PA - Hosie SpangleElizabeth Simaan, PA-C. Patient reports no interval changes.  Adrian Foley is a [age]yoM with hx of CAD, NSTEMI (2017) s/p CABG x4 - presented to ED with painful right groin hernia. He has had this for many years and typically gets by with a hernia belt. Always has been reducible by his account. This time it hasn't been. Denies any n/v, just bulge that's uncomfortable. Last BM yesterday. Denies flatus. He denies any blood thinners aside from daily aspirin.  Objective: Vital signs in last 24 hours: Temp:  [98.4 F (36.9 C)] 98.4 F (36.9 C) (07/31 0634) Pulse Rate:  [48-57] 50 (07/31 1715) Resp:  [16-19] 18 (07/31 1620) BP: (105-139)/(67-95) 139/73 (07/31 1715) SpO2:  [94 %-97 %] 95 % (07/31 1715)    Intake/Output from previous day: No intake/output data recorded. Intake/Output this shift: No intake/output data recorded.  Gen: NAD, comfortable CV: RRR Pulm: Normal work of breathing Abd: Soft, NT/ND. Right inguinal hernia incarcerated. No overlying skin changes Ext: SCDs in place  Lab Results: CBC  Recent Labs    07/19/18 0823  WBC 6.7  HGB 17.0  HCT 50.8  PLT 182   BMET Recent Labs    07/19/18 0823  NA 140  K 4.4  CL 104  CO2 27  GLUCOSE 151*  BUN 14  CREATININE 1.00  CALCIUM 9.3   PT/INR No results for input(s): LABPROT, INR in the last 72 hours. ABG No results for input(s): PHART, HCO3 in the last 72 hours.  Invalid input(s): PCO2, PO2  Studies/Results:  Anti-infectives: Anti-infectives (From admission, onward)   Start     Dose/Rate Route Frequency Ordered Stop   07/20/18 0600  cefoTEtan (CEFOTAN) 2 g in sodium chloride 0.9 % 100 mL IVPB    Note to Pharmacy:  Pharmacy may adjust dose strength for optimal dosing.   Send with patient on call to the OR.  Anesthesia to complete antibiotic administration <5860min prior to incision per [facility].   2 g 200 mL/hr over 30 Minutes Intravenous On call  to O.R. 07/19/18 1657 07/21/18 0559       Assessment/Plan: Patient Active Problem List   Diagnosis Date Noted  . Incarcerated right inguinal hernia 07/19/2018  . CAD (coronary artery disease)   . Unstable angina (HCC) 12/21/2015  . CAD S/P percutaneous coronary angioplasty 12/21/2015  . Type 2 diabetes mellitus with vascular disease (HCC) 02/18/2014  . OTHER SPECIFIED FORMS OF HEARING LOSS 12/26/2008  . Dyslipidemia 09/11/2007  . CAD (coronary artery disease) of artery bypass graft 09/11/2007   A/P Adrian Foley is a [phone] with incarcerated bowel containing right inguinal hernia  -The anatomy and physiology of the GI tract and abdominal wall was discussed at length with the patient with associated illustrations. The pathophysiology of hernias was discussed at length as well -We discussed options moving forward including observation vs surgery. With observation, risks ongoing incarceration and subsequent strangulation.  We discussed surgical options including open right inguinal hernia repair with mesh, possible laparoscopy, possible laparotomy and bowel resection. We discussed the possible use of mesh. -The planned procedure, material risks (including, but not limited to, pain, bleeding, infection, scarring, need for blood transfusion, damage to surrounding structures-blood vessels/nerves/viscus/organs, need for additional procedures, leak from bowel anastomosis if one is performed, injury to ureter, recurrence of hernia, chronic pain, mesh complications including erosion into other structures/vessels/organs/viscus, worsening of pre-existing medical conditions, pneumonia, heart attack, stroke, death) benefits and alternatives  to surgery were discussed at length. The patient's questions were answered to his satisfaction, he voiced understanding and elected to proceed with surgery. Additionally, we discussed typical postoperative expectations and the recovery process.   LOS: 0 days   Stephanie Coup. Cliffton Asters, M.D. [facility]

## 2018-07-19 NOTE — Anesthesia Procedure Notes (Signed)
Procedure Name: Intubation Date/Time: 07/19/2018 7:06 PM Performed by: Claris Che, CRNA Pre-anesthesia Checklist: Patient identified, Emergency Drugs available, Suction available, Patient being monitored and Timeout performed Patient Re-evaluated:Patient Re-evaluated prior to induction Oxygen Delivery Method: Circle system utilized Preoxygenation: Pre-oxygenation with 100% oxygen Induction Type: IV induction Ventilation: Mask ventilation without difficulty Laryngoscope Size: Mac and 3 Grade View: Grade II Tube type: Oral Tube size: 8.0 mm Number of attempts: 1 Airway Equipment and Method: Stylet Placement Confirmation: ETT inserted through vocal cords under direct vision,  positive ETCO2 and breath sounds checked- equal and bilateral Secured at: 24 cm Tube secured with: Tape Dental Injury: Teeth and Oropharynx as per pre-operative assessment

## 2018-07-19 NOTE — Progress Notes (Signed)
Patient arrived to the room in hospital bed. Oriented patient to room, call light, tv, Trail policies and belonging policies.

## 2018-07-20 ENCOUNTER — Encounter (HOSPITAL_COMMUNITY): Payer: Self-pay | Admitting: Surgery

## 2018-07-20 LAB — HIV ANTIBODY (ROUTINE TESTING W REFLEX): HIV SCREEN 4TH GENERATION: NONREACTIVE

## 2018-07-20 MED ORDER — ACETAMINOPHEN 325 MG PO TABS: 650.0000 mg | ORAL_TABLET | Freq: Four times a day (QID) | ORAL | Status: DC | PRN

## 2018-07-20 NOTE — Discharge Instructions (Signed)
[facility]      [facility] [phone]  OPEN ABDOMINAL SURGERY: POST OP INSTRUCTIONS  Always review your discharge instruction sheet given to you by the facility where your surgery was performed.  IF YOU HAVE DISABILITY OR FAMILY LEAVE FORMS, YOU MUST BRING THEM TO THE OFFICE FOR PROCESSING.  PLEASE DO NOT GIVE THEM TO YOUR DOCTOR.  1. A prescription for pain medication may be given to you upon discharge.  Take your pain medication as prescribed, if needed.  If narcotic pain medicine is not needed, then you may take acetaminophen (Tylenol) or ibuprofen (Advil) as needed. 2. Take your usually prescribed medications unless otherwise directed. 3. If you need a refill on your pain medication, please contact your pharmacy. They will contact our office to request authorization.  Prescriptions will not be filled after 5pm or on week-ends. 4. You should follow a light diet the first few days after arrival home, such as soup and crackers, pudding, etc.unless your doctor has advised otherwise. A high-fiber, low fat diet can be resumed as tolerated.   Be sure to include lots of fluids daily. Most patients will experience some swelling and bruising on the chest and neck area.  Ice packs will help.  Swelling and bruising can take several days to resolve 5. Most patients will experience some swelling and bruising in the area of the incision. Ice pack will help. Swelling and bruising can take several days to resolve..  6. It is common to experience some constipation if taking pain medication after surgery.  Increasing fluid intake and taking a stool softener will usually help or prevent this problem from occurring.  A mild laxative (Milk of Magnesia or Miralax) should be taken according to package directions if there are no bowel movements after 48 hours. 7.  You may have steri-strips (small skin tapes) in place directly over the incision.  These strips should be left on the skin for 7-10 days.  If your  surgeon used skin glue on the incision, you may shower in 24 hours.  The glue will flake off over the next 2-3 weeks.  Any sutures or staples will be removed at the office during your follow-up visit. You may find that a light gauze bandage over your incision may keep your staples from being rubbed or pulled. You may shower and replace the bandage daily. 8. ACTIVITIES:  You may resume regular (light) daily activities beginning the next day--such as daily self-care, walking, climbing stairs--gradually increasing activities as tolerated.  You may have sexual intercourse when it is comfortable.  Refrain from any heavy lifting or straining until approved by your doctor. a. You may drive when you no longer are taking prescription pain medication, you can comfortably wear a seatbelt, and you can safely maneuver your car and apply brakes b. Return to Work: ___________________________________ 9. You should see your doctor in the office for a follow-up appointment approximately two weeks after your surgery.  Make sure that you call for this appointment within a day or two after you arrive home to insure a convenient appointment time. OTHER INSTRUCTIONS:  _____________________________________________________________ _____________________________________________________________  WHEN TO CALL YOUR DOCTOR: 1. Fever over 101.0 2. Inability to urinate 3. Nausea and/or vomiting 4. Extreme swelling or bruising 5. Continued bleeding from incision. 6. Increased pain, redness, or drainage from the incision. 7. Difficulty swallowing or breathing 8. Muscle cramping or spasms. 9. Numbness or tingling in hands or feet or around lips.  The clinic staff is available to   answer your questions during regular business hours.  Please don't hesitate to call and ask to speak to one of the nurses if you have concerns.  For further questions, please visit [email or web address]   

## 2018-07-20 NOTE — Progress Notes (Signed)
Patient given discharge instructions. Patient verbalized understanding. Patient left in stable condition.

## 2018-07-20 NOTE — Discharge Summary (Signed)
     Patient ID: Adrian Foley [id number] 12/25/1950 67 y.o.  Admit date: 07/19/2018 Discharge date: 07/20/2018  Admitting Diagnosis: Incarcerated right inguinal hernia  Discharge Diagnosis Patient Active Problem List   Diagnosis Date Noted  . Incarcerated right inguinal hernia 07/19/2018  . CAD (coronary artery disease)   . Unstable angina (HCC) 12/21/2015  . CAD S/P percutaneous coronary angioplasty 12/21/2015  . Type 2 diabetes mellitus with vascular disease (HCC) 02/18/2014  . OTHER SPECIFIED FORMS OF HEARING LOSS 12/26/2008  . Dyslipidemia 09/11/2007  . CAD (coronary artery disease) of artery bypass graft 09/11/2007    Consultants none  Reason for Admission: Adrian Foley is a 67 y/o male with PMH CAD, NSTEMI 2017 s/p CABGx4, and right inguinal hernia [facility] R inguinal hernia. States that he has known about his hernia for years but it has never caused him trouble and usually reduces. He wears a hernia belt. Today he states right groin pain woke him up from his sleep. Some radiation of pain to right testicle. Denies nausea, vomiting. Last BM was yesterday afternoon and was normal. Has not had flatus today. Denies chest pain, SOB, orthopnea, or DOE. Works as a Music therapistcarpenter and walks regularly. NKDA. States he is a former smoker and drinks one alocholic beverage monthly. Denies illicit drug use. Last took his 325 mg ASA this morning at 0700. Denies use of plavix or other blood thinners.  Procedures Open right inguinal hernia repair with mesh, Dr. Cliffton AstersWhite 7/31  Hospital Course:  The patient was admitted and underwent the above procedure.  On POD 1, he was tolerating a regular diet, voiding well, good pain control, and mobilizing well.  He was otherwise stable for DC home at this time.    Physical Exam: Abd: soft, essentially nontender, incision does have ecchymosis, but is soft and no evidence of significant hematoma, +BS For baseline  reference:     Allergies as of 07/20/2018   No Known Allergies     Medication List    TAKE these medications   acetaminophen 325 MG tablet Commonly known as:  TYLENOL Take 2 tablets (650 mg total) by mouth every 6 (six) hours as needed.   aspirin 81 MG tablet Take 81 mg by mouth daily.   atorvastatin 40 MG tablet Commonly known as:  LIPITOR Take 1 tablet (40 mg total) by mouth daily.   Co Q 10 100 MG Caps Take 100 mg by mouth daily.        Follow-up Information    Andria MeuseWhite, Christopher M, MD Follow up in 3 week(s).   Specialty:  General Surgery Why:  our office will call you Contact information: [address] [place] [address] [phone]           Signed: Barnetta ChapelKelly Montserrath Madding, [facility] 07/20/2018, 2:03 PM Pager: [phone]

## 2018-09-10 ENCOUNTER — Other Ambulatory Visit: Payer: Self-pay | Admitting: Family Medicine

## 2018-10-11 ENCOUNTER — Other Ambulatory Visit: Payer: Self-pay | Admitting: Family Medicine

## 2018-12-15 ENCOUNTER — Ambulatory Visit (INDEPENDENT_AMBULATORY_CARE_PROVIDER_SITE_OTHER): Payer: Self-pay | Admitting: Family Medicine

## 2018-12-15 ENCOUNTER — Encounter: Payer: Self-pay | Admitting: Family Medicine

## 2018-12-15 VITALS — BP 110/64 | HR 55 | Temp 97.7°F | Resp 16 | Ht 69.0 in | Wt 196.4 lb

## 2018-12-15 DIAGNOSIS — I2510 Atherosclerotic heart disease of native coronary artery without angina pectoris: Secondary | ICD-10-CM

## 2018-12-15 DIAGNOSIS — E782 Mixed hyperlipidemia: Secondary | ICD-10-CM

## 2018-12-15 DIAGNOSIS — E119 Type 2 diabetes mellitus without complications: Secondary | ICD-10-CM

## 2018-12-15 LAB — HEMOGLOBIN A1C: HEMOGLOBIN A1C: 6.8 % — AB (ref 4.6–6.5)

## 2018-12-15 LAB — BASIC METABOLIC PANEL
BUN: 14 mg/dL (ref 6–23)
CHLORIDE: 106 meq/L (ref 96–112)
CO2: 28 mEq/L (ref 19–32)
Calcium: 9.4 mg/dL (ref 8.4–10.5)
Creatinine, Ser: 1 mg/dL (ref 0.40–1.50)
GFR: 79.38 mL/min (ref >60.00)
Glucose, Bld: 145 mg/dL — ABNORMAL HIGH (ref 70–99)
POTASSIUM: 4.6 meq/L (ref 3.5–5.1)
SODIUM: 141 meq/L (ref 135–145)

## 2018-12-15 LAB — LIPID PANEL
CHOLESTEROL: 149 mg/dL (ref 0–200)
HDL: 31.5 mg/dL — AB (ref >39.00)
NonHDL: 117.72
TRIGLYCERIDES: 301 mg/dL — AB (ref 0.0–149.0)
Total CHOL/HDL Ratio: 5
VLDL: 60.2 mg/dL — ABNORMAL HIGH (ref 0.0–40.0)

## 2018-12-15 LAB — LDL CHOLESTEROL, DIRECT: Direct LDL: 79 mg/dL

## 2018-12-15 NOTE — Progress Notes (Signed)
OFFICE VISIT  12/15/2018   CC:  Chief Complaint  Patient presents with  . Follow-up    RCI, pt is fasting.    HPI:    Patient is a 67 y.o. Caucasian male who presents for 6 mo f/u DM 2, HLD, and CAD (hx of NSTEMI, CABG 2017).  Feeling well, no complaints.  HLD: changed pt from simva to atorva 40mg  qd 6 mo ago due to poor lipid panel. Has soreness in MCPs of both hands but tolerable.  No other side effects.  Takes this med every day.  UJ:WJXBJM:tries to eat a good diabetic diet.  Good water drinker--no colas. 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  CAD: on ASA and statin.  No CP or SOB.  ROS: no CP, no SOB, no wheezing, no cough, no dizziness, no HAs, no rashes, no melena/hematochezia.  No polyuria or polydipsia.  No myalgias or arthralgias.  He is currently on cephalexin for a tooth abscess---will be getting this tooth extracted.  Past Medical History:  Diagnosis Date  . CAD (coronary artery disease)    CABG 2017  . Diabetes mellitus without complication (HCC)    type 2 --metformin stopped 04/2016 due to taste side effect  . Kidney stones   . Mixed hyperlipidemia   . NSTEMI (non-ST elevated myocardial infarction) (HCC)   . Seizures (HCC)    post-traumatic (pediatric) initially, then was medicated for a few years, then was weened off med and has been seizure-free since.    Past Surgical History:  Procedure Laterality Date  . CARDIAC CATHETERIZATION N/A 12/23/2015   Procedure: Left Heart Cath and Coronary Angiography;  Surgeon: Marykay Lexavid W Harding, MD;  [facility];  Service: Cardiovascular;  Laterality: N/A;  any available cath attending  . CORONARY ANGIOPLASTY WITH STENT PLACEMENT     approx age 67 per pt report  . CORONARY ARTERY BYPASS GRAFT N/A 12/29/2015   Procedure: CORONARY ARTERY BYPASS GRAFTING (CABG) x four,  using left internal mammary artery and right leg greater saphenous vein harvested endoscopically;  Surgeon: Kerin PernaPeter Van Trigt,  MD;  [facility];  Service: Open Heart Surgery;  Laterality: N/A;  . INGUINAL HERNIA REPAIR Right 07/19/2018   Procedure: RIGHT HERNIA REPAIR INCARCERATED INGUINAL ADULT;  Surgeon: Andria MeuseWhite, Christopher M, MD;  Location: [facility] OR;  Service: General;  Laterality: Right;  . INSERTION OF MESH Right 07/19/2018   Procedure: INSERTION OF MESH;  Surgeon: Andria MeuseWhite, Christopher M, MD;  Location: [facility] OR;  Service: General;  Laterality: Right;  . TEE WITHOUT CARDIOVERSION N/A 12/29/2015   Procedure: TRANSESOPHAGEAL ECHOCARDIOGRAM (TEE);  Surgeon: Kerin PernaPeter Van Trigt, MD;  [facility];  Service: Open Heart Surgery;  Laterality: N/A;    Outpatient Medications Prior to Visit  Medication Sig Dispense Refill  . aspirin 81 MG tablet Take 81 mg by mouth daily.    Marland Kitchen. atorvastatin (LIPITOR) 40 MG tablet TAKE 1 TABLET BY MOUTH EVERY DAY 30 tablet 3  . cephALEXin (KEFLEX) 500 MG capsule Take 500 mg by mouth 3 (three) times daily.    Marland Kitchen. acetaminophen (TYLENOL) 325 MG tablet Take 2 tablets (650 mg total) by mouth every 6 (six) hours as needed. (Patient not taking: Reported on 12/15/2018)    . Coenzyme Q10 (CO Q 10) 100 MG CAPS Take 100 mg by mouth daily.     No facility-administered medications prior to visit.     No Known Allergies  ROS As per HPI  PE: Blood  pressure 110/64, pulse (!) 55, temperature 97.7 F (36.5 C), temperature source Oral, resp. rate 16, height 5\' 9"  (1.753 m), weight 196 lb 6 oz (89.1 kg), SpO2 95 %. Gen: Alert, well appearing.  Patient is oriented to person, place, time, and situation. AFFECT: pleasant, lucid thought and speech. CV: RRR, no m/r/g.   LUNGS: CTA bilat, nonlabored resps, good aeration in all lung fields. EXT: no clubbing or cyanosis.  no edema.    LABS:  Lab Results  Component Value Date   TSH 3.582 12/21/2015   Lab Results  Component Value Date   WBC 6.7 07/19/2018   HGB 17.0 07/19/2018   HCT 50.8 07/19/2018   MCV 91.5 07/19/2018   PLT 182 07/19/2018   Lab Results   Component Value Date   CREATININE 1.00 07/19/2018   BUN 14 07/19/2018   NA 140 07/19/2018   K 4.4 07/19/2018   CL 104 07/19/2018   CO2 27 07/19/2018   Lab Results  Component Value Date   ALT 23 07/19/2018   AST 21 07/19/2018   ALKPHOS 66 07/19/2018   BILITOT 0.9 07/19/2018   Lab Results  Component Value Date   CHOL 178 06/12/2018   Lab Results  Component Value Date   HDL 37.20 (L) 06/12/2018   Lab Results  Component Value Date   LDLCALC 76 04/21/2016   Lab Results  Component Value Date   TRIG 321.0 (H) 06/12/2018   Lab Results  Component Value Date   CHOLHDL 5 06/12/2018   Lab Results  Component Value Date   PSA 3.59 07/06/2010   Lab Results  Component Value Date   HGBA1C 6.5 06/12/2018    IMPRESSION AND PLAN:  1) CAD, hx of CABG: asymptomatic.  Exercising, eating healthy. Continue ASA and statin.  2) HLD: check FLP today to see how the change from simva to atorva did. He is tolerating atorva well w/exception of MCP joint pains which he is tolerating w/out problem.  3) DM, diet controlled. Continue good diabetic diet and exercise habits. HbA1c and BMET today.  An After Visit Summary was printed and given to the patient.  FOLLOW UP: Return in about 6 months (around 06/16/2019) for annual CPE (fasting).  Signed:  Santiago BumpersPhil McGowen, MD           12/15/2018

## 2018-12-18 ENCOUNTER — Ambulatory Visit: Payer: Self-pay | Admitting: Family Medicine

## 2018-12-18 ENCOUNTER — Other Ambulatory Visit: Payer: Self-pay | Admitting: *Deleted

## 2018-12-18 MED ORDER — ATORVASTATIN CALCIUM 80 MG PO TABS: 80.0000 mg | ORAL_TABLET | Freq: Every day | ORAL | 2 refills | Status: DC

## 2019-01-19 IMAGING — CT CT ABD-PELV W/ CM
2 of 5 series · 16 of 46 positions shown, 18 images · IV contrast (omnipaque)
Comparison: CT abdomen pelvis 10/06/2015

CLINICAL DATA: Incarcerated inguinal hernia

EXAM:
CT ABDOMEN AND PELVIS WITH CONTRAST
TECHNIQUE: Multidetector CT imaging of the abdomen and pelvis was performed
using the standard protocol following bolus administration of
intravenous contrast.
CONTRAST:  100mL OMNIPAQUE IOHEXOL 300 MG/ML  SOLN

[Series 3: abdomen 5.0 · axial · 0.82mm/px · z∈[+710,+1190]mm · 13 of 110 slices shown, 15 images]
[im 7/110  soft-tissue]
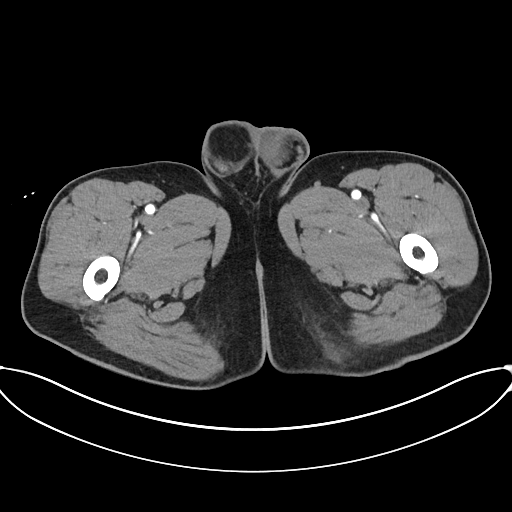
[im 7/110  bone]
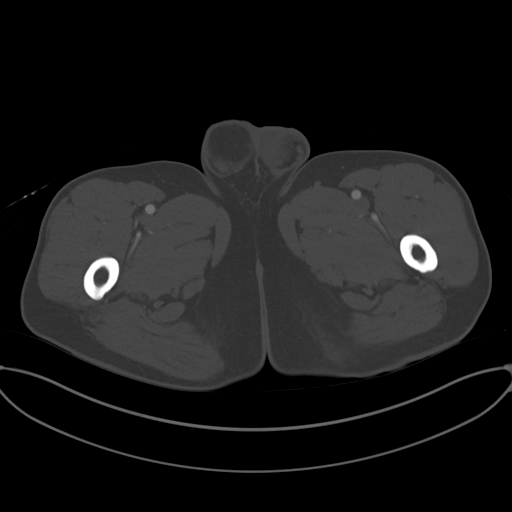
[im 14/110  soft-tissue]
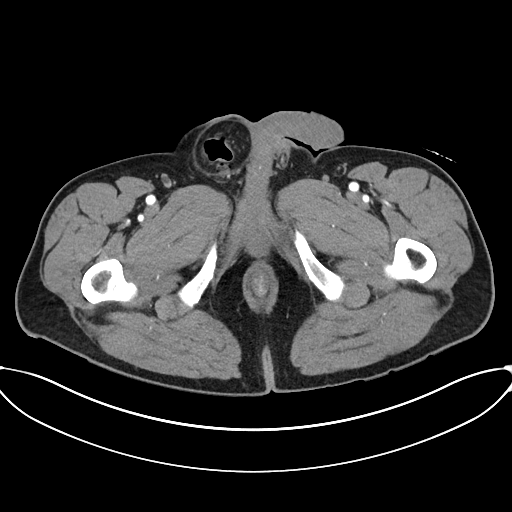
[im 21/110  soft-tissue]
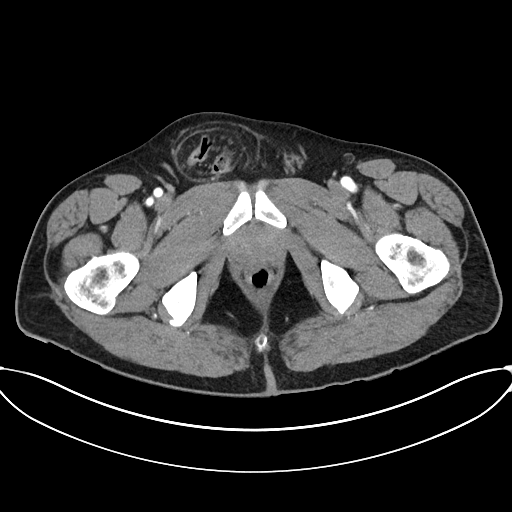
[im 35/110  soft-tissue]
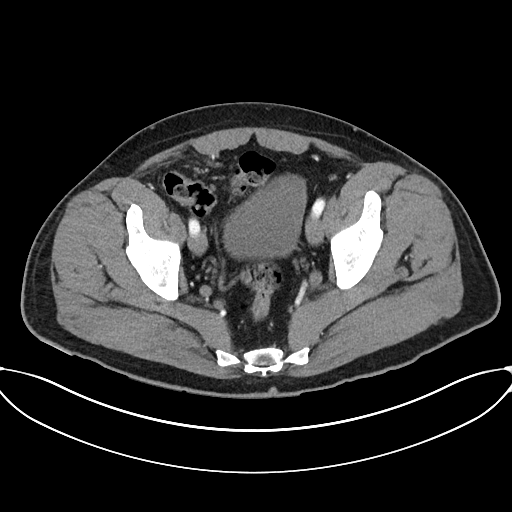
[im 41/110  soft-tissue]
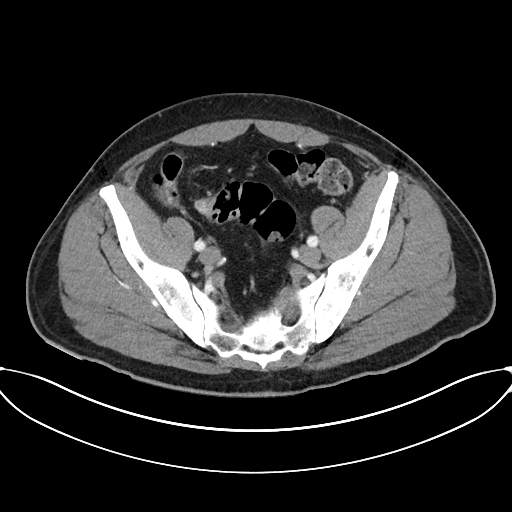
[im 48/110  soft-tissue]
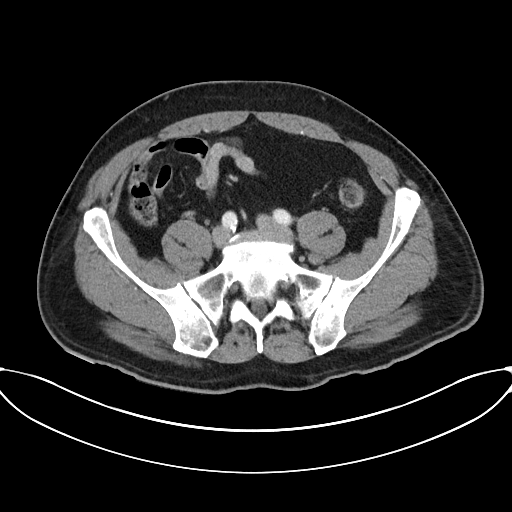
[im 55/110  soft-tissue]
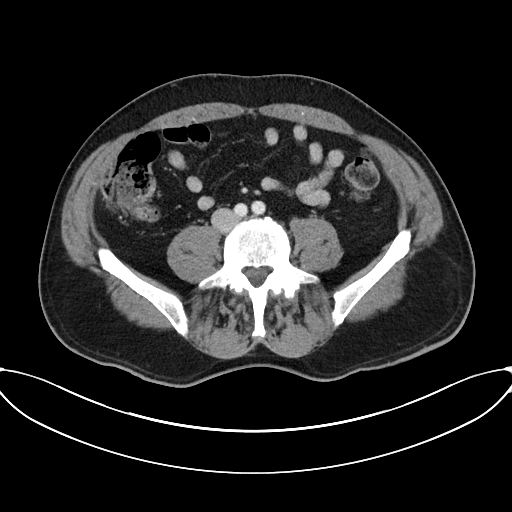
[im 62/110  soft-tissue]
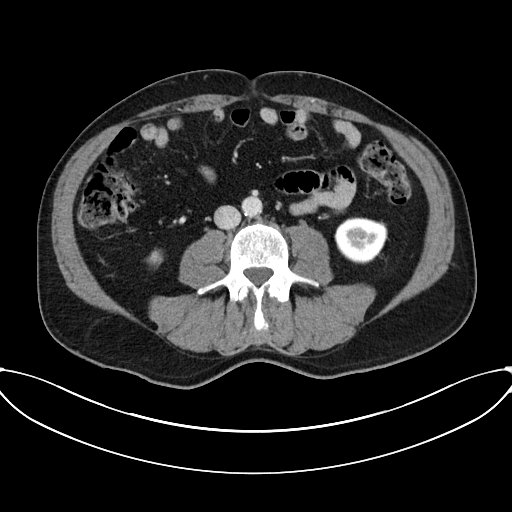
[im 69/110  soft-tissue]
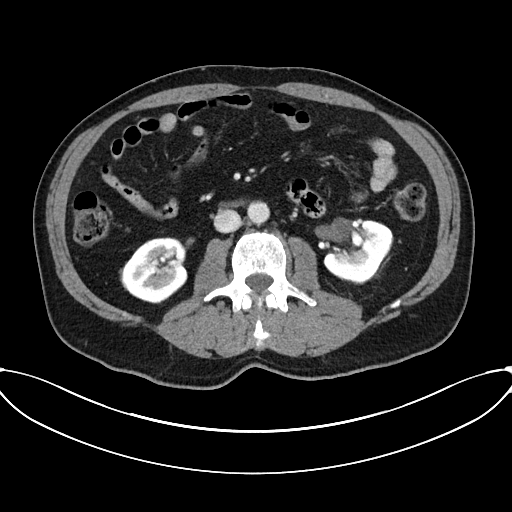
[im 69/110  bone]
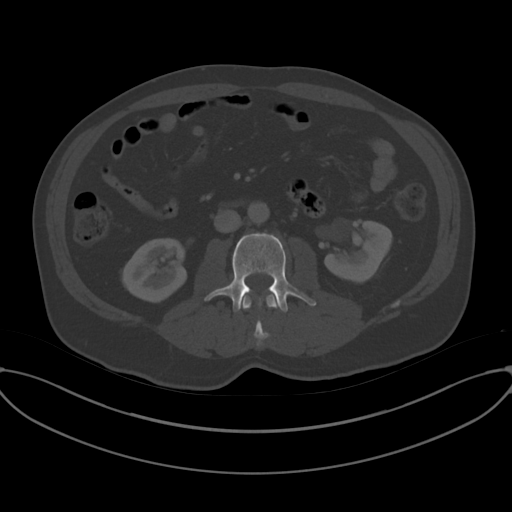
[im 75/110  soft-tissue]
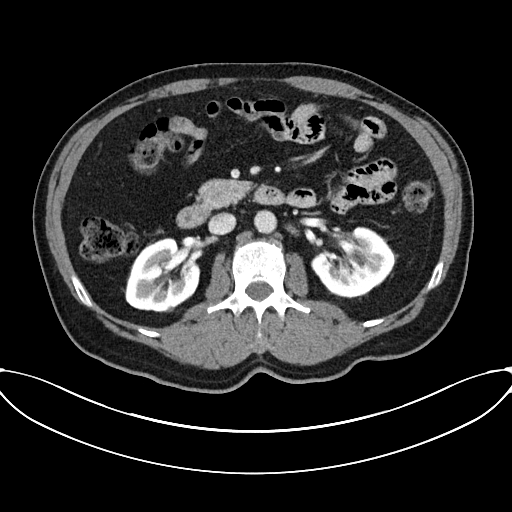
[im 89/110  soft-tissue]
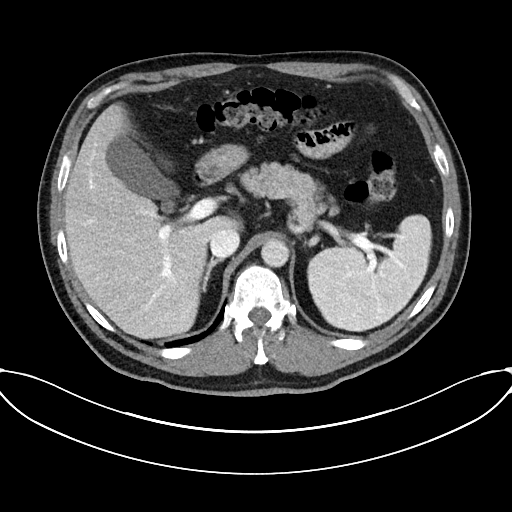
[im 96/110  soft-tissue]
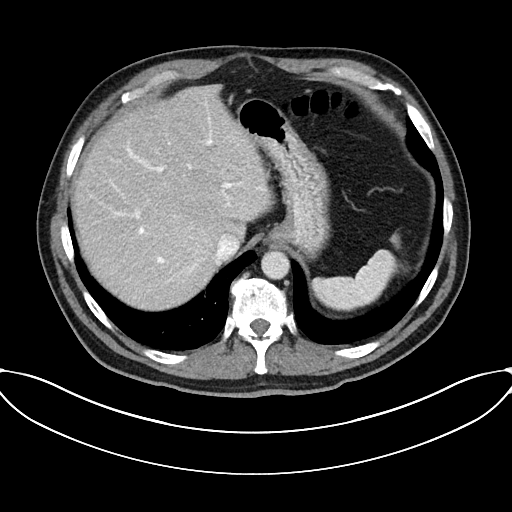
[im 103/110  soft-tissue]
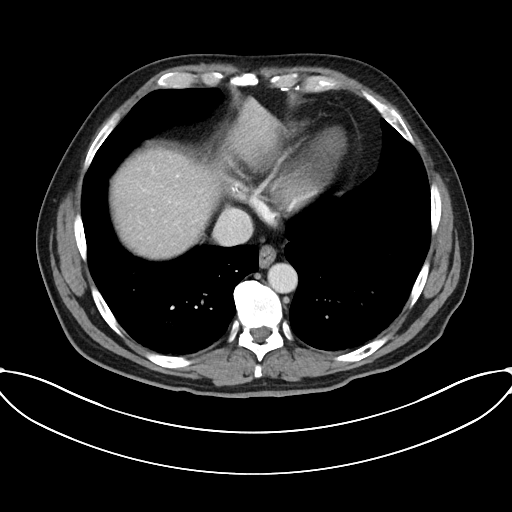

[Series 6: abdomen 3.0 mpr cor · coronal · 0.81mm/px · 3 of 109 slices shown]
[im 37/109  soft-tissue]
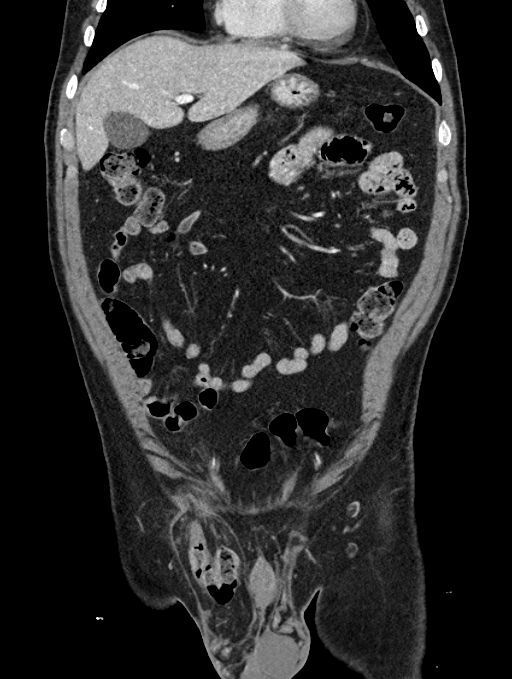
[im 49/109  soft-tissue]
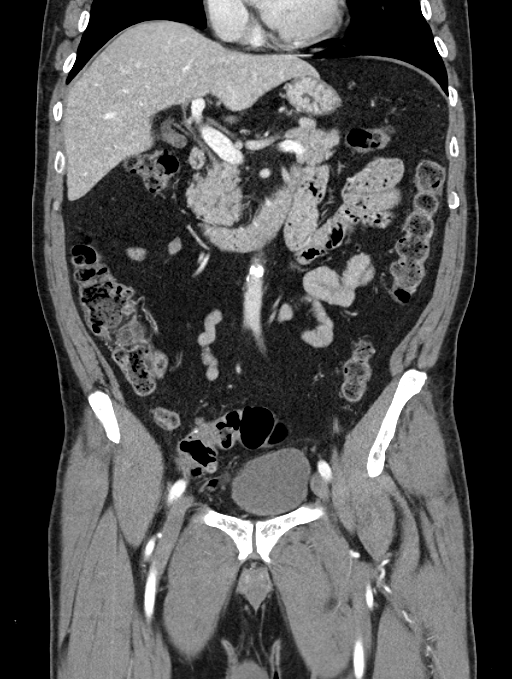
[im 61/109  soft-tissue]
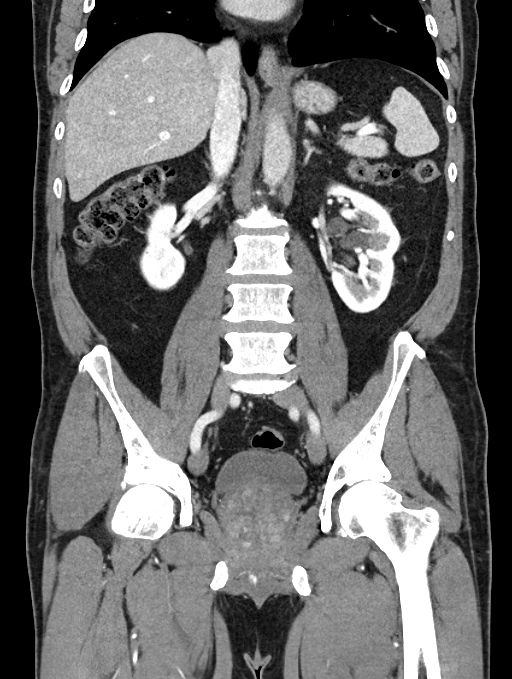

[16 of 46 positions shown; findings below may reference images not displayed]

FINDINGS: Lower chest: Lung bases clear bilaterally.

Hepatobiliary: Liver gallbladder and bile ducts normal.

Pancreas: Normal

Spleen: Hyperenhancing left splenic lesions measures 23 x 30 mm.
Contour deformity seen in this area on the prior CT which was done
without intravenous contrast and this lesion appears stable.
Possible hemangioma

Adrenals/Urinary Tract: Normal kidneys ureter and bladder. No renal
mass obstruction or stone.

Stomach/Bowel: Normal stomach. Negative for bowel obstruction.
Normal appendix. Mild sigmoid diverticulosis.

Progression of right inguinal hernia which now contains a loop of
sigmoid colon. No fluid in the hernia sac. No bowel wall edema.
Scrotal hydrocele.

Vascular/Lymphatic: Mild atherosclerotic disease. Negative for
aortic aneurysm. Negative for lymphadenopathy.

Reproductive: Moderate to marked prostate enlargement measuring
x 6.8 cm.

Other: Negative for free fluid or abscess

Musculoskeletal: Moderate disc degeneration and spondylosis at
L5-S1. Mild spurring L3-4.
IMPRESSION: 1. Right inguinal hernia now containing a loop of sigmoid colon. No
bowel edema or fluid in the hernia sac. Mild sigmoid diverticulosis.
2. Normal appendix
3. Moderate to marked prostate enlargement
4. Hyperenhancing splenic lesion appearing stable since [DATE].

## 2019-01-24 ENCOUNTER — Ambulatory Visit: Payer: Self-pay

## 2019-01-24 NOTE — Telephone Encounter (Signed)
Please advise. Thanks.  

## 2019-01-24 NOTE — Telephone Encounter (Signed)
Go back to taking 40 mg atorvastatin daily and see if joint pain/stiffness gets better.

## 2019-01-24 NOTE — Telephone Encounter (Signed)
Pt advised. He wants to know if there is an alternative to this medication. He stated that he had the joint pain/stiffness on the 40mg  dose. 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 He is also concerned that this will cause permanet damage.   Please advise. Thanks.

## 2019-01-24 NOTE — Telephone Encounter (Signed)
  Patient called to say that on last visit with Dr Milinda Cave he informed the doctor of pain and stiffness in his joints and the doctor increased his atorvastatin (LIPITOR) 80 MG tablet. Patient states that since the increase of this medication the pain and stiffness has gotten significantly worse. Patient is requesting a call back with some instructions on what he can do. Ph# [phone]

## 2019-01-25 ENCOUNTER — Other Ambulatory Visit: Payer: Self-pay | Admitting: Family Medicine

## 2019-01-25 ENCOUNTER — Encounter: Payer: Self-pay | Admitting: Family Medicine

## 2019-01-25 MED ORDER — PRAVASTATIN SODIUM 20 MG PO TABS: 20.0000 mg | ORAL_TABLET | Freq: Every day | ORAL | 3 refills | Status: DC

## 2019-01-25 NOTE — Telephone Encounter (Signed)
Pt advised and voiced understanding.   

## 2019-01-25 NOTE — Telephone Encounter (Signed)
OK, stop atorvastatin completely. Wait 2 weeks. Then start pravastatin.  I'll eRx pravastatin now. Reassure him that this does NOT cause any actual muscle or joint damage.

## 2020-03-31 ENCOUNTER — Telehealth: Payer: Self-pay

## 2020-03-31 NOTE — Telephone Encounter (Signed)
  Patient Consent for Virtual Visit         Adrian Foley has provided verbal consent on 03/31/2020 for a virtual visit (video or telephone).   CONSENT FOR VIRTUAL VISIT FOR:  Adrian Foley  By participating in this virtual visit I agree to the following:  I hereby voluntarily request, consent and authorize [facility] and its employed or contracted physicians, physician assistants, nurse practitioners or other licensed health care professionals (the Practitioner), to provide me with telemedicine health care services (the "Services") as deemed necessary by the treating Practitioner. I acknowledge and consent to receive the Services by the Practitioner via telemedicine. I understand that the telemedicine visit will involve communicating with the Practitioner through live audiovisual communication technology and the disclosure of certain medical information by electronic transmission. I acknowledge that I have been given the opportunity to request an in-person assessment or other available alternative prior to the telemedicine visit and am voluntarily participating in the telemedicine visit.  I understand that I have the right to withhold or withdraw my consent to the use of telemedicine in the course of my care at any time, without affecting my right to future care or treatment, and that the Practitioner or I may terminate the telemedicine visit at any time. I understand that I have the right to inspect all information obtained and/or recorded in the course of the telemedicine visit and may receive copies of available information for a reasonable fee.  I understand that some of the potential risks of receiving the Services via telemedicine include:  Marland Kitchen Delay or interruption in medical evaluation due to technological equipment failure or disruption; . Information transmitted may not be sufficient (e.g. poor resolution of images) to allow for appropriate medical decision making by the Practitioner;  and/or  . In rare instances, security protocols could fail, causing a breach of personal health information.  Furthermore, I acknowledge that it is my responsibility to provide information about my medical history, conditions and care that is complete and accurate to the best of my ability. I acknowledge that Practitioner's advice, recommendations, and/or decision may be based on factors not within their control, such as incomplete or inaccurate data provided by me or distortions of diagnostic images or specimens that may result from electronic transmissions. I understand that the practice of medicine is not an exact science and that Practitioner makes no warranties or guarantees regarding treatment outcomes. I acknowledge that a copy of this consent can be made available to me via my patient portal [facility] MyChart), or I can request a printed copy by calling the office of [facility].    I understand that my insurance will be billed for this visit.   I have read or had this consent read to me. . I understand the contents of this consent, which adequately explains the benefits and risks of the Services being provided via telemedicine.  . I have been provided ample opportunity to ask questions regarding this consent and the Services and have had my questions answered to my satisfaction. . I give my informed consent for the services to be provided through the use of telemedicine in my medical care

## 2020-04-01 ENCOUNTER — Telehealth: Payer: Self-pay | Admitting: Cardiology

## 2020-04-01 ENCOUNTER — Telehealth (INDEPENDENT_AMBULATORY_CARE_PROVIDER_SITE_OTHER): Payer: Medicare Other | Admitting: Cardiology

## 2020-04-01 ENCOUNTER — Encounter: Payer: Self-pay | Admitting: Cardiology

## 2020-04-01 VITALS — Ht 69.0 in | Wt 191.5 lb

## 2020-04-01 DIAGNOSIS — E785 Hyperlipidemia, unspecified: Secondary | ICD-10-CM | POA: Diagnosis not present

## 2020-04-01 DIAGNOSIS — E1159 Type 2 diabetes mellitus with other circulatory complications: Secondary | ICD-10-CM

## 2020-04-01 DIAGNOSIS — I2510 Atherosclerotic heart disease of native coronary artery without angina pectoris: Secondary | ICD-10-CM

## 2020-04-01 DIAGNOSIS — Z951 Presence of aortocoronary bypass graft: Secondary | ICD-10-CM | POA: Diagnosis not present

## 2020-04-01 DIAGNOSIS — Z87891 Personal history of nicotine dependence: Secondary | ICD-10-CM

## 2020-04-01 DIAGNOSIS — Z7982 Long term (current) use of aspirin: Secondary | ICD-10-CM | POA: Diagnosis not present

## 2020-04-01 DIAGNOSIS — Z9861 Coronary angioplasty status: Secondary | ICD-10-CM

## 2020-04-01 NOTE — Progress Notes (Signed)
Virtual Visit via Telephone Note   This visit type was conducted due to national recommendations for restrictions regarding the COVID-19 Pandemic (e.g. social distancing) in an effort to limit this patient's exposure and mitigate transmission in our community.  Due to his co-morbid illnesses, this patient is at least at moderate risk for complications without adequate follow up.  This format is felt to be most appropriate for this patient at this time.  The patient did not have access to video technology/had technical difficulties with video requiring transitioning to audio format only (telephone).  All issues noted in this document were discussed and addressed.  No physical exam could be performed with this format.  Please refer to the patient's chart for his  consent to telehealth for [facility].   The patient was identified using 2 identifiers.  Date:  04/01/2020   ID:  Adrian Foley, DOB February 12, 1951, MRN [id number]  Patient Location: Home Provider Location: Home  PCP:  [name], MD  Cardiologist:  Dr Duke Salvia Electrophysiologist:  None   Evaluation Performed:  Follow-Up Visit  Chief Complaint:  none  History of Present Illness:    Adrian Foley is a 69 y.o. male with history of coronary disease.  He had an LAD PCI in 2000.  He then presented in January 2017 with an NSTEMI.  He underwent catheterization and ultimately CABG x4 with an LIMA to LAD, SVG to RI, SVG to OM, and SVG to PL.  During that admission he was also diagnosed with type II non-insulin-dependent diabetes.  He was seen in follow-up in 2017 once and then we have not seen him since.  He was contacted today for a follow-up.  The patient says he is been doing well since his CABG.  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  He denies having any chest pain or any significant medical issues since we saw him in 2017.  He is followed by his primary care provider.  The only medication he takes is an aspirin a day.  He stopped  taking statin therapy because of myalgias.  He no longer is on medication for his diabetes, he says he is controlling it with diet alone.  The patient does not have symptoms concerning for COVID-19 infection (fever, chills, cough, or new shortness of breath).    Past Medical History:  Diagnosis Date  . CAD (coronary artery disease)    CABG 2017  . Diabetes mellitus without complication (HCC)    type 2 --metformin stopped 04/2016 due to taste side effect  . Kidney stones   . Mixed hyperlipidemia   . NSTEMI (non-ST elevated myocardial infarction) (HCC)   . Seizures (HCC)    post-traumatic (pediatric) initially, then was medicated for a few years, then was weened off med and has been seizure-free since.   Past Surgical History:  Procedure Laterality Date  . CARDIAC CATHETERIZATION N/A 12/23/2015   Procedure: Left Heart Cath and Coronary Angiography;  Surgeon: Marykay Lex, MD;  Location[facility] INVASIVE CV LAB;  Service: Cardiovascular;  Laterality: N/A;  any available cath attending  . CORONARY ANGIOPLASTY WITH STENT PLACEMENT     approx age 34 per pt report  . CORONARY ARTERY BYPASS GRAFT N/A 12/29/2015   Procedure: CORONARY ARTERY BYPASS GRAFTING (CABG) x four,  using left internal mammary artery and right leg greater saphenous vein harvested endoscopically;  Surgeon: [name], MD;  Location: [facility];  Service: Open Heart Surgery;  Laterality: N/A;  . INGUINAL  HERNIA REPAIR Right 07/19/2018   Procedure: RIGHT HERNIA REPAIR INCARCERATED INGUINAL ADULT;  Surgeon: Ileana Roup, MD;  Location: [place];  Service: General;  Laterality: Right;  . INSERTION OF MESH Right 07/19/2018   Procedure: INSERTION OF MESH;  Surgeon: Ileana Roup, MD;  Location: [place];  Service: General;  Laterality: Right;  . TEE WITHOUT CARDIOVERSION N/A 12/29/2015   Procedure: TRANSESOPHAGEAL ECHOCARDIOGRAM (TEE);  Surgeon: Ivin Poot, MD;  Location: [place];  Service: Open Heart Surgery;  Laterality: N/A;       Current Meds  Medication Sig  . aspirin 81 MG tablet Take 81 mg by mouth daily.     Allergies:   Atorvastatin and Simvastatin   Social History   Tobacco Use  . Smoking status: Former Smoker    Quit date: 12/21/2007    Years since quitting: 12.2  . Smokeless tobacco: Former Systems developer    Types: Index date: 05/23/2015  Substance Use Topics  . Alcohol use: No  . Drug use: No     Family Hx: The patient's family history includes Alcohol abuse in his father; Heart disease in his maternal aunt and maternal uncle.  ROS:   Please see the history of present illness.    Rt hernia repair 2019 All other systems reviewed and are negative.   Prior CV studies:   The following studies were reviewed today: Cath 2017 Echo 2017  Labs/Other Tests and Data Reviewed:    EKG:  An ECG dated 07/19/2018 was personally reviewed today and demonstrated:  NSR- SB HR 51  Recent Labs: No results found for requested labs within last 8760 hours.   Recent Lipid Panel Lab Results  Component Value Date/Time   CHOL 149 12/15/2018 08:15 AM   TRIG 301.0 (H) 12/15/2018 08:15 AM   HDL 31.50 (L) 12/15/2018 08:15 AM   CHOLHDL 5 12/15/2018 08:15 AM   LDLCALC 76 04/21/2016 08:28 AM   LDLDIRECT 79.0 12/15/2018 08:15 AM    Wt Readings from Last 3 Encounters:  04/01/20 191 lb 8 oz (86.9 kg)  12/15/18 196 lb 6 oz (89.1 kg)  07/19/18 190 lb (86.2 kg)     Objective:    Vital Signs:  Ht 5\' 9"  (1.753 m)   Wt 191 lb 8 oz (86.9 kg)   BMI 28.28 kg/m    VITAL SIGNS:  reviewed  ASSESSMENT & PLAN:    CABG- CABG x 24 Dec 2015- doing well since  NIDDM- Currently diet controlled- followed by PCP  HLD- He reports intolerance (myalgia) to statin Rx  SB- Not on beta blocker  H/O PCI- History of LAD PCI in 2000.  Plan: I will request records from his PCP regarding his lipids. He would be a candidate for PCSK9.  He is not on beta blocker secondary to sinus bradycardia. His last B/P in Epic was  110/64. He should follow up with [place] yearly.   COVID-19 Education: The signs and symptoms of COVID-19 were discussed with the patient and how to seek care for testing (follow up with PCP or arrange E-visit).  The importance of social distancing was discussed today.  Time:   Today, I have spent 15 minutes with the patient with telehealth technology discussing the above problems.     Medication Adjustments/Labs and Tests Ordered: Current medicines are reviewed at length with the patient today.  Concerns regarding medicines are outlined above.   Tests Ordered: No orders of the defined types were placed in this  encounter.   Medication Changes: No orders of the defined types were placed in this encounter.   Follow Up:  In Person Dr Duke Salvia in one year  Signed, Corine Shelter, Cordelia Poche  04/01/2020 8:18 AM    [facility]

## 2020-04-01 NOTE — Patient Instructions (Signed)
Medication Instructions:  Continue same medications *If you need a refill on your cardiac medications before your next appointment, please call your pharmacy*   Lab Work: None ordered    Testing/Procedures: None ordered   Follow-Up: At [facility], you and your health needs are our priority.  As part of our continuing mission to provide you with exceptional heart care, we have created designated Provider Care Teams.  These Care Teams include your primary Cardiologist (physician) and Advanced Practice Providers (APPs -  Physician Assistants and Nurse Practitioners) who all work together to provide you with the care you need, when you need it.  We recommend signing up for the patient portal called "MyChart".  Sign up information is provided on this After Visit Summary.  MyChart is used to connect with patients for Virtual Visits (Telemedicine).  Patients are able to view lab/test results, encounter notes, upcoming appointments, etc.  Non-urgent messages can be sent to your provider as well.   To learn more about what you can do with MyChart, go to [email or web address].    Your next appointment:  1 year     Call in Feb to schedule a April appointment    The format for your next appointment: Office     Provider:  Dr.[place]    

## 2020-05-15 ENCOUNTER — Telehealth: Payer: Self-pay | Admitting: *Deleted

## 2020-05-15 DIAGNOSIS — Z9861 Coronary angioplasty status: Secondary | ICD-10-CM

## 2020-05-15 DIAGNOSIS — E785 Hyperlipidemia, unspecified: Secondary | ICD-10-CM

## 2020-05-15 NOTE — Telephone Encounter (Signed)
  Adrian Foley I would suggest he start Crestor 5 mg 3 times a week. Check CMET and fasting lipids in 3 months. F/U with Dr Duke Salvia in April 2022.   Corine Shelter PA-C  05/12/2020  7:45 AM

## 2020-06-02 MED ORDER — ROSUVASTATIN CALCIUM 5 MG PO TABS: 5.0000 mg | ORAL_TABLET | ORAL | 3 refills | Status: DC

## 2020-06-02 NOTE — Telephone Encounter (Signed)
Patient aware and verbalized understanding.  rx sent to pharmacy and lab slips to be mailed

## 2020-08-05 ENCOUNTER — Other Ambulatory Visit: Payer: Self-pay | Admitting: *Deleted

## 2020-08-05 DIAGNOSIS — E785 Hyperlipidemia, unspecified: Secondary | ICD-10-CM

## 2020-08-05 DIAGNOSIS — I2510 Atherosclerotic heart disease of native coronary artery without angina pectoris: Secondary | ICD-10-CM

## 2023-02-28 ENCOUNTER — Ambulatory Visit (INDEPENDENT_AMBULATORY_CARE_PROVIDER_SITE_OTHER): Payer: Medicare Other | Admitting: Family Medicine

## 2023-02-28 ENCOUNTER — Encounter: Payer: Self-pay | Admitting: Family Medicine

## 2023-02-28 VITALS — BP 146/82 | HR 52 | Temp 98.0°F | Ht 69.25 in | Wt 195.6 lb

## 2023-02-28 DIAGNOSIS — I25118 Atherosclerotic heart disease of native coronary artery with other forms of angina pectoris: Secondary | ICD-10-CM

## 2023-02-28 DIAGNOSIS — Z125 Encounter for screening for malignant neoplasm of prostate: Secondary | ICD-10-CM

## 2023-02-28 DIAGNOSIS — R0789 Other chest pain: Secondary | ICD-10-CM

## 2023-02-28 DIAGNOSIS — E119 Type 2 diabetes mellitus without complications: Secondary | ICD-10-CM

## 2023-02-28 DIAGNOSIS — E7800 Pure hypercholesterolemia, unspecified: Secondary | ICD-10-CM | POA: Diagnosis not present

## 2023-02-28 DIAGNOSIS — D751 Secondary polycythemia: Secondary | ICD-10-CM

## 2023-02-28 LAB — POCT GLYCOSYLATED HEMOGLOBIN (HGB A1C)
HbA1c POC (<> result, manual entry): 6.9 % (ref 4.0–5.6)
HbA1c, POC (controlled diabetic range): 6.9 % (ref 0.0–7.0)
HbA1c, POC (prediabetic range): 6.9 % — AB (ref 5.7–6.4)
Hemoglobin A1C: 6.9 % — AB (ref 4.0–5.6)

## 2023-02-28 LAB — COMPREHENSIVE METABOLIC PANEL
ALT: 20 U/L (ref 0–53)
AST: 16 U/L (ref 0–37)
Albumin: 4 g/dL (ref 3.5–5.2)
Alkaline Phosphatase: 74 U/L (ref 39–117)
BUN: 16 mg/dL (ref 6–23)
CO2: 28 mEq/L (ref 19–32)
Calcium: 9.6 mg/dL (ref 8.4–10.5)
Chloride: 104 mEq/L (ref 96–112)
Creatinine, Ser: 1.08 mg/dL (ref 0.40–1.50)
GFR: 69.52 mL/min (ref >60.00)
Glucose, Bld: 150 mg/dL — ABNORMAL HIGH (ref 70–99)
Potassium: 4.4 mEq/L (ref 3.5–5.1)
Sodium: 139 mEq/L (ref 135–145)
Total Bilirubin: 0.7 mg/dL (ref 0.2–1.2)
Total Protein: 6.8 g/dL (ref 6.0–8.3)

## 2023-02-28 LAB — CBC
HCT: 52.6 % — ABNORMAL HIGH (ref 39.0–52.0)
Hemoglobin: 17.5 g/dL — ABNORMAL HIGH (ref 13.0–17.0)
MCHC: 33.2 g/dL (ref 30.0–36.0)
MCV: 91.4 fl (ref 78.0–100.0)
Platelets: 215 10*3/uL (ref 150.0–400.0)
RBC: 5.76 Mil/uL (ref 4.22–5.81)
RDW: 13.3 % (ref 11.5–15.5)
WBC: 5.1 10*3/uL (ref 4.0–10.5)

## 2023-02-28 LAB — LIPID PANEL
Cholesterol: 221 mg/dL — ABNORMAL HIGH (ref 0–200)
HDL: 32.3 mg/dL — ABNORMAL LOW (ref >39.00)
Total CHOL/HDL Ratio: 7
Triglycerides: 419 mg/dL — ABNORMAL HIGH (ref 0.0–149.0)

## 2023-02-28 LAB — PSA, MEDICARE: PSA: 10.04 ng/ml — ABNORMAL HIGH (ref 0.10–4.00)

## 2023-02-28 LAB — MICROALBUMIN / CREATININE URINE RATIO
Creatinine,U: 146.9 mg/dL
Microalb Creat Ratio: 1.6 mg/g (ref 0.0–30.0)
Microalb, Ur: 2.4 mg/dL — ABNORMAL HIGH (ref 0.0–1.9)

## 2023-02-28 LAB — TSH: TSH: 4.11 u[IU]/mL (ref 0.35–5.50)

## 2023-02-28 LAB — LDL CHOLESTEROL, DIRECT: Direct LDL: 117 mg/dL

## 2023-02-28 MED ORDER — NITROGLYCERIN 0.4 MG SL SUBL: 0.4000 mg | SUBLINGUAL_TABLET | SUBLINGUAL | 1 refills | Status: AC | PRN

## 2023-02-28 NOTE — Progress Notes (Signed)
Office Note 02/28/2023  CC:  Chief Complaint  Patient presents with   Establish Care   Medical Management of Chronic Issues    HPI:  Adrian Foley is a 72 y.o. White male who is here to reestablish care: Diabetes, hyperlipidemia, and coronary artery disease (hx of NSTEMI, CABG 2017). [name] most recent primary MD: None since me back in 2021. Old records were reviewed prior to or during today's visit.  Adrian Foley states he has been doing well.  He had got off his aspirin at some point in the past because he felt like he was doing well and did not need it. About a week ago he noticed that when he walked outside in the morning his chest felt a little bit tight, states that it was mild intensity and lasted on and off for about an hour.  It was not brought on by any exertion. In fact, later in the afternoon he is able to do some physical labor on his property and has no symptoms at all. With his recent chest tightnes he has no shortness of breath, no chest pain or heaviness, no palpitations, no jaw or arm pain, no dizziness, no nausea, no diaphoresis. No recent prolonged immobilization or surgical procedures.  No history of thrombosis. No pain or swelling in lower extremities.  He has not followed up with his cardiologist since 2021. When he last saw them they started him on Crestor 5 mg 3 days a week (hx statin intol) but he never took this.  History of CABG back in 2017, no problems since.   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    Past Surgical History:  Procedure Laterality Date    CARDIAC CATHETERIZATION N/A 12/23/2015   Procedure: Left Heart Cath and Coronary Angiography;  Surgeon: Leonie Man, MD;  Location: [facility];  Service: Cardiovascular;  Laterality: N/A;  any available cath attending   CORONARY ANGIOPLASTY WITH STENT PLACEMENT     approx age 81 per pt report   CORONARY ARTERY BYPASS GRAFT N/A 12/29/2015   Procedure: CORONARY ARTERY BYPASS GRAFTING (CABG) x four,  using left internal mammary artery and right leg greater saphenous vein harvested endoscopically;  Surgeon: Ivin Poot, MD;  Location: [facility];  Service: Open Heart Surgery;  Laterality: N/A;   INGUINAL HERNIA REPAIR Right 07/19/2018   Procedure: RIGHT HERNIA REPAIR INCARCERATED INGUINAL ADULT;  Surgeon: Ileana Roup, MD;  Location: [place];  Service: General;  Laterality: Right;   INSERTION OF MESH Right 07/19/2018   Procedure: INSERTION OF MESH;  Surgeon: Ileana Roup, MD;  Location: [place];  Service: General;  Laterality: Right;   TEE WITHOUT CARDIOVERSION N/A 12/29/2015   Procedure: TRANSESOPHAGEAL ECHOCARDIOGRAM (TEE);  Surgeon: Ivin Poot, MD;  Location: [place] [facility];  Service: Open Heart Surgery;  Laterality: N/A;    Family History  Problem Relation Age of Onset   Early death Mother    Alcohol abuse Father    Early death Father    Depression Sister    Arthritis  Sister    Early death Sister    Heart disease Maternal Aunt    Heart disease Maternal Uncle     Social History   Socioeconomic History   Marital status: Married    Spouse name: Not on file   Number of children: Not on file   Years of education: Not on file   Highest education level: Not on file  Occupational History   Not on file  Tobacco Use   Smoking status: Former   Smokeless tobacco: Former    Types: Chew    Quit date: 05/23/2015  Substance and Sexual Activity   Alcohol use: No   Drug use: No   Sexual activity: Not on file  Other Topics Concern   Not on file  Social History Narrative    Married, 3 children, 6 grandkids.   Lives in [place].   Occup: carpentry   Tob: 30 pack-yr hx, quit 2009.  Chewed tobacco x "years"-quit around age 38.   Alc: none   Social Determinants of Health   Financial Resource Strain: Not on file  Food Insecurity: Not on file  Transportation Needs: Not on file  Physical Activity: Not on file  Stress: Not on file  Social Connections: Not on file  Intimate Partner Violence: Not on file    Outpatient Encounter Medications as of 02/28/2023  Medication Sig   aspirin 81 MG tablet Take 81 mg by mouth daily.   rosuvastatin (CRESTOR) 5 MG tablet Take 1 tablet (5 mg total) by mouth 3 (three) times a week. (Patient not taking: Reported on 02/28/2023)   No facility-administered encounter medications on file as of 02/28/2023.    Allergies  Allergen Reactions   Atorvastatin Other (See Comments)    myalgias   Simvastatin Other (See Comments)    Myalgias/arthralgias    Review of Systems  Constitutional:  Negative for appetite change, chills, fatigue and fever.  HENT:  Negative for congestion, dental problem, ear pain and sore throat.   Eyes:  Negative for discharge, redness and visual disturbance.  Respiratory:  Negative for cough, chest tightness, shortness of breath and wheezing.   Cardiovascular:  Positive for chest pain (tightness). Negative for palpitations and leg swelling.  Gastrointestinal:  Negative for abdominal pain, blood in stool, diarrhea, nausea and vomiting.  Genitourinary:  Negative for difficulty urinating, dysuria, flank pain, frequency, hematuria and urgency.  Musculoskeletal:  Negative for arthralgias, back pain, joint swelling, myalgias and neck stiffness.  Skin:  Negative for pallor and rash.  Neurological:  Negative for dizziness, speech difficulty, weakness and headaches.  Hematological:  Negative for adenopathy. Does not bruise/bleed easily.  Psychiatric/Behavioral:  Negative for confusion and sleep disturbance. The  patient is not nervous/anxious.     PE; Blood pressure (!) 154/91, pulse (!) 52, temperature 98 F (36.7 C), height 5' 9.25" (1.759 m), weight 195 lb 9.6 oz (88.7 kg), SpO2 97 %. Body mass index is 28.68 kg/m. Rpt bp 146/82  Physical Exam  Gen: Alert, well appearing.  Patient is oriented to person, place, time, and situation. AFFECT: pleasant, lucid thought and speech. Neck: no bruit CV: RRR, no m/r/g.   LUNGS: CTA bilat, nonlabored resps, good aeration in all lung fields. ABD: soft, NT, ND, BS normal.  No hepatospenomegaly or mass.  No bruits. EXT: no clubbing or cyanosis.  no edema.    Pertinent labs:  Last CBC Lab Results  Component Value Date   WBC 6.7 07/19/2018   HGB 17.0 07/19/2018   HCT  50.8 07/19/2018   MCV 91.5 07/19/2018   MCH 30.6 07/19/2018   RDW 12.2 07/19/2018   PLT 182 123456   Last metabolic panel Lab Results  Component Value Date   GLUCOSE 145 (H) 12/15/2018   NA 141 12/15/2018   K 4.6 12/15/2018   CL 106 12/15/2018   CO2 28 12/15/2018   BUN 14 12/15/2018   CREATININE 1.00 12/15/2018   GFRNONAA >60 07/19/2018   CALCIUM 9.4 12/15/2018   PROT 6.6 07/19/2018   ALBUMIN 3.8 07/19/2018   BILITOT 0.9 07/19/2018   ALKPHOS 66 07/19/2018   AST 21 07/19/2018   ALT 23 07/19/2018   ANIONGAP 9 07/19/2018   Last lipids Lab Results  Component Value Date   CHOL 149 12/15/2018   HDL 31.50 (L) 12/15/2018   LDLCALC 76 04/21/2016   LDLDIRECT 79.0 12/15/2018   TRIG 301.0 (H) 12/15/2018   CHOLHDL 5 12/15/2018   Last hemoglobin A1c Lab Results  Component Value Date   HGBA1C 6.8 (H) 12/15/2018   Last thyroid functions Lab Results  Component Value Date   TSH 3.582 12/21/2015   12 lead EKG today: sinus brady (51), incomplete RBBB, no ischemic changes, normal intervals and duration, no ectopy.  No change compared to 07/19/18.  ASSESSMENT AND PLAN:   New patient, reestablishing care.  1.  Chest tightness, history of coronary artery disease,  status post CABG in 2017. Atypical symptoms, nonexertional.  EKG today sinus bradycardia, otherwise normal and unchanged compared to EKG 07/19/2018. Although symptoms atypical his history is strongly and I feel like a stress test is appropriate. He restarted aspirin a week ago when he started having chest tightness. He declined statin. Will renew his nitroglycerin prescription. Recommended he call his cardiologist to arrange f/u-->phone # given today.  2.  Diabetes, diet controlled. Hemoglobin A1c today is 6.9%. Will recommend he get A1c below 6.5% but will discuss medication at a future visit.  3. Preventative health care:  PSA ordered. Needs Prevnar 20, Tdap, and Shingrix-->he declined. Has never had colon ca screening->will discuss in future.  An After Visit Summary was printed and given to the patient.  F/u: TBD  Signed:  Crissie Sickles, MD           02/28/2023

## 2023-02-28 NOTE — Patient Instructions (Addendum)
Erlene Quan PA-C Address: [address], [place], [place] [address] Phone: [phone]

## 2023-03-01 ENCOUNTER — Other Ambulatory Visit: Payer: Medicare Other

## 2023-03-01 DIAGNOSIS — D751 Secondary polycythemia: Secondary | ICD-10-CM

## 2023-03-01 NOTE — Addendum Note (Signed)
Addended by: Deveron Furlong D on: 03/01/2023 11:32 AM   Modules accepted: Orders

## 2023-03-01 NOTE — Addendum Note (Signed)
Addended by: Izell Fullerton on: 03/01/2023 11:33 AM   Modules accepted: Orders

## 2023-03-02 ENCOUNTER — Telehealth (HOSPITAL_COMMUNITY): Payer: Self-pay | Admitting: *Deleted

## 2023-03-02 LAB — IRON,TIBC AND FERRITIN PANEL
%SAT: 30 % (calc) (ref 20–48)
Ferritin: 139 ng/mL (ref 24–380)
Iron: 101 ug/dL (ref 50–180)
TIBC: 341 mcg/dL (calc) (ref 250–425)

## 2023-03-02 NOTE — Telephone Encounter (Signed)
Spoke with patient and he was given detailed instructions about his STRESS TEST on 03/04/23.

## 2023-03-04 ENCOUNTER — Ambulatory Visit (HOSPITAL_COMMUNITY): Payer: Medicare Other | Attending: Cardiology

## 2023-03-04 ENCOUNTER — Other Ambulatory Visit: Payer: Self-pay | Admitting: Family Medicine

## 2023-03-04 DIAGNOSIS — I25118 Atherosclerotic heart disease of native coronary artery with other forms of angina pectoris: Secondary | ICD-10-CM | POA: Diagnosis present

## 2023-03-04 DIAGNOSIS — R0789 Other chest pain: Secondary | ICD-10-CM | POA: Diagnosis present

## 2023-03-04 LAB — MYOCARDIAL PERFUSION IMAGING
LV dias vol: 69 mL (ref 62–150)
LV sys vol: 28 mL
Nuc Stress EF: 60 %
Peak HR: 76 {beats}/min
Rest HR: 48 {beats}/min
Rest Nuclear Isotope Dose: 10.7 mCi
SDS: 4
SRS: 1
SSS: 5
Stress Nuclear Isotope Dose: 29.6 mCi
TID: 1.05

## 2023-03-04 MED ORDER — REGADENOSON 0.4 MG/5ML IV SOLN
0.4000 mg | Freq: Once | INTRAVENOUS | Status: AC
  Administered 2023-03-04: 0.4 mg via INTRAVENOUS

## 2023-03-04 MED ORDER — TECHNETIUM TC 99M TETROFOSMIN IV KIT
10.7000 | PACK | Freq: Once | INTRAVENOUS | Status: AC | PRN
  Administered 2023-03-04: 10.7 via INTRAVENOUS

## 2023-03-04 MED ORDER — ROSUVASTATIN CALCIUM 5 MG PO TABS: ORAL_TABLET | ORAL | 1 refills | Status: DC

## 2023-03-04 MED ORDER — TECHNETIUM TC 99M TETROFOSMIN IV KIT
29.6000 | PACK | Freq: Once | INTRAVENOUS | Status: AC | PRN
  Administered 2023-03-04: 29.6 via INTRAVENOUS

## 2023-04-15 ENCOUNTER — Other Ambulatory Visit: Payer: Self-pay | Admitting: Family Medicine

## 2023-04-15 NOTE — Telephone Encounter (Signed)
Left pt a Vm to return my call  

## 2023-04-19 ENCOUNTER — Telehealth: Payer: Self-pay

## 2023-04-19 ENCOUNTER — Ambulatory Visit (INDEPENDENT_AMBULATORY_CARE_PROVIDER_SITE_OTHER): Payer: Medicare Other | Admitting: Family Medicine

## 2023-04-19 ENCOUNTER — Encounter: Payer: Self-pay | Admitting: Family Medicine

## 2023-04-19 VITALS — BP 140/70 | HR 50 | Temp 97.8°F | Resp 14 | Ht 69.0 in | Wt 193.0 lb

## 2023-04-19 DIAGNOSIS — E119 Type 2 diabetes mellitus without complications: Secondary | ICD-10-CM

## 2023-04-19 DIAGNOSIS — E782 Mixed hyperlipidemia: Secondary | ICD-10-CM

## 2023-04-19 DIAGNOSIS — R972 Elevated prostate specific antigen [PSA]: Secondary | ICD-10-CM

## 2023-04-19 DIAGNOSIS — Z1211 Encounter for screening for malignant neoplasm of colon: Secondary | ICD-10-CM

## 2023-04-19 DIAGNOSIS — R030 Elevated blood-pressure reading, without diagnosis of hypertension: Secondary | ICD-10-CM

## 2023-04-19 NOTE — Telephone Encounter (Signed)
Spoke with pt to schedule AWV in office. Patient declined to schedule wellness visit at this time.   

## 2023-04-19 NOTE — Patient Instructions (Signed)
Buy a blood pressure maching/cuff and check blood pressure and heart rate daily and write these numbers down. Bring the numbers in with you next visit with me in 2 wks. Normal blood pressure is less than 130 on top and less than 80 on bottom.

## 2023-04-19 NOTE — Progress Notes (Signed)
OFFICE VISIT  04/19/2023  CC:  Chief Complaint  Patient presents with   Medical Management of Chronic Issues    Patient is a 72 y.o. male who presents for 6 weeks follow-up diabetes. A/P as of last visit: "1.  Chest tightness, history of coronary artery disease, status post CABG in 2017. Atypical symptoms, nonexertional.  EKG today sinus bradycardia, otherwise normal and unchanged compared to EKG 07/19/2018. Although symptoms atypical his history is strongly and I feel like a stress test is appropriate. He restarted aspirin a week ago when he started having chest tightness. He declined statin. Will renew his nitroglycerin prescription. Recommended he call his cardiologist to arrange f/u-->phone # given today.   2.  Diabetes, diet controlled. Hemoglobin A1c today is 6.9%. Will recommend he get A1c below 6.5% but will discuss medication at a future visit.   3. Preventative health care:  PSA ordered. Needs Prevnar 20, Tdap, and Shingrix-->he declined. Has never had colon ca screening->will discuss in future."  INTERIM HX: Feeling well.  Glucoses checked-?no but has meter. Rosuva: took 3 doses and stopped b/c caused foamy urine and some dysuria-->resolved when stopped statin.  He has not had any problem with elevated blood pressures in the past.   Past Medical History:  Diagnosis Date   CAD (coronary artery disease)    CABG 2017   Diabetes mellitus without complication ([facility])    type 2 --metformin stopped 04/2016 due to taste side effect   Kidney stones    Mixed hyperlipidemia    NSTEMI (non-ST elevated myocardial infarction) ([facility])    Seizures ([facility])    post-traumatic (pediatric) initially, then was medicated for a few years, then was weened off med and has been seizure-free since.    Past Surgical History:  Procedure Laterality Date   CARDIAC CATHETERIZATION N/A 12/23/2015   Procedure: Left Heart Cath and Coronary Angiography;  Surgeon: Marykay Lex, MD;  Location: [facility]  INVASIVE [facility];  Service: Cardiovascular;  Laterality: N/A;  any available cath attending   CORONARY ANGIOPLASTY WITH STENT PLACEMENT     approx age 28 per pt report   CORONARY ARTERY BYPASS GRAFT N/A 12/29/2015   Procedure: CORONARY ARTERY BYPASS GRAFTING (CABG) x four,  using left internal mammary artery and right leg greater saphenous vein harvested endoscopically;  Surgeon: [name], MD;  Location: [facility];  Service: Open Heart Surgery;  Laterality: N/A;   INGUINAL HERNIA REPAIR Right 07/19/2018   Procedure: RIGHT HERNIA REPAIR INCARCERATED INGUINAL ADULT;  Surgeon: Andria Meuse, MD;  [facility];  Service: General;  Laterality: Right;   INSERTION OF MESH Right 07/19/2018   Procedure: INSERTION OF MESH;  Surgeon: Andria Meuse, MD;  [facility];  Service: General;  Laterality: Right;   TEE WITHOUT CARDIOVERSION N/A 12/29/2015   Procedure: TRANSESOPHAGEAL ECHOCARDIOGRAM (TEE);  Surgeon: [name], MD;  Location: [facility] OR;  Service: Open Heart Surgery;  Laterality: N/A;    Outpatient Medications Prior to Visit  Medication Sig Dispense Refill   aspirin 81 MG tablet Take 81 mg by mouth daily.     nitroGLYCERIN (NITROSTAT) 0.4 MG SL tablet Place 1 tablet (0.4 mg total) under the tongue every 5 (five) minutes as needed for chest pain. (Patient not taking: Reported on 04/19/2023) 15 tablet 1   rosuvastatin (CRESTOR) 5 MG tablet 1 tab po three days a week (Patient not taking: Reported on 04/19/2023) 12 tablet 1   No facility-administered medications prior to visit.  Allergies  Allergen Reactions   Atorvastatin Other (See Comments)    myalgias   Simvastatin Other (See Comments)    Myalgias/arthralgias    Review of Systems As per HPI  PE:    04/19/2023    7:56 AM 03/04/2023   10:12 AM 02/28/2023   11:09 AM  Vitals with BMI  Height 5\' 9"  5\' 9"    Weight 193 lbs 195 lbs   BMI 28.49 28.78   Systolic 159  146  Diastolic 81  82  Pulse 50       Physical  Exam  Gen: Alert, well appearing.  Patient is oriented to person, place, time, and situation. No further exam today  LABS:  Last CBC Lab Results  Component Value Date   WBC 5.1 02/28/2023   HGB 17.5 (H) 02/28/2023   HCT 52.6 (H) 02/28/2023   MCV 91.4 02/28/2023   MCH 30.6 07/19/2018   RDW 13.3 02/28/2023   PLT 215.0 02/28/2023   Lab Results  Component Value Date   IRON 101 03/01/2023   TIBC 341 03/01/2023   FERRITIN 139 03/01/2023   Last metabolic panel Lab Results  Component Value Date   GLUCOSE 150 (H) 02/28/2023   NA 139 02/28/2023   K 4.4 02/28/2023   CL 104 02/28/2023   CO2 28 02/28/2023   BUN 16 02/28/2023   CREATININE 1.08 02/28/2023   GFRNONAA >60 07/19/2018   CALCIUM 9.6 02/28/2023   PROT 6.8 02/28/2023   ALBUMIN 4.0 02/28/2023   BILITOT 0.7 02/28/2023   ALKPHOS 74 02/28/2023   AST 16 02/28/2023   ALT 20 02/28/2023   ANIONGAP 9 07/19/2018   Last lipids Lab Results  Component Value Date   CHOL 221 (H) 02/28/2023   HDL 32.30 (L) 02/28/2023   LDLCALC 76 04/21/2016   LDLDIRECT 117.0 02/28/2023   TRIG (H) 02/28/2023    419.0 Triglyceride is over 400; calculations on Lipids are invalid.   CHOLHDL 7 02/28/2023   Last hemoglobin A1c Lab Results  Component Value Date   HGBA1C 6.9 (A) 02/28/2023   HGBA1C 6.9 02/28/2023   HGBA1C 6.9 (A) 02/28/2023   HGBA1C 6.9 02/28/2023   Last thyroid functions Lab Results  Component Value Date   TSH 4.11 02/28/2023   Lab Results  Component Value Date   PSA 10.04 (H) 02/28/2023   PSA 3.59 07/06/2010   IMPRESSION AND PLAN:  #1 diabetes without complication. A1c 6.9% 6 weeks ago.  He is not checking any glucoses but says he has made some improvement in diet. We will defer medication at this time but if not improving at next follow-up will recommend metformin.  #2 mixed hyperlipidemia--> he did not tolerate rosuvastatin 5mg .  History of myalgias on other statins. He defers further trial of statin at this  time.  We we will revisit this in the future.  #3 elevated blood pressure without diagnosis of hypertension. Discussed option of starting medication now or having him check blood pressures at home for the next 2 weeks.  He chose to check blood pressures at home.  Discussed goal of 130/80.  #4 colon cancer screening--Cologuard ordered today.  5.  Elevated PSA.  There was some confusion when this result returned 6 weeks ago and he did not get a referral.  Referral ordered today.  An After Visit Summary was printed and given to the patient.  FOLLOW UP: Return in about 2 weeks (around 05/03/2023) for f/u bp.  Signed:  Santiago Bumpers, MD  04/19/2023   

## 2023-04-20 ENCOUNTER — Telehealth: Payer: Self-pay | Admitting: Family Medicine

## 2023-04-20 NOTE — Telephone Encounter (Signed)
Copied from CRM [id number]. Topic: Medicare AWV >> Apr 20, 2023 10:48 AM Gwenith Spitz wrote: Reason for CRM: Called patient to schedule Medicare Annual Wellness Visit (AWV). Left message for patient to call back and schedule Medicare Annual Wellness Visit (AWV).  Last date of AWV: N/A  Please schedule an appointment at any time with Please schedule an appointment  any Wednesday as Tele/Video visits with Inetta Fermo, NHA. Please schedule AWVS with Inetta Fermo, Please schedule as video/tele visit only, Wednesdays only [facility]. .  If any questions, please contact me at [phone].  Thank you ,  Gabriel Cirri [facility] AWV TEAM Direct Dial [phone]

## 2023-05-01 LAB — COLOGUARD

## 2023-05-03 ENCOUNTER — Ambulatory Visit: Payer: Medicare Other | Admitting: Family Medicine

## 2023-05-04 ENCOUNTER — Encounter: Payer: Self-pay | Admitting: Urology

## 2023-05-04 ENCOUNTER — Ambulatory Visit (INDEPENDENT_AMBULATORY_CARE_PROVIDER_SITE_OTHER): Payer: Medicare Other | Admitting: Urology

## 2023-05-04 VITALS — BP 150/81 | HR 58 | Ht 69.0 in | Wt 190.0 lb

## 2023-05-04 DIAGNOSIS — R972 Elevated prostate specific antigen [PSA]: Secondary | ICD-10-CM

## 2023-05-04 LAB — URINALYSIS, ROUTINE W REFLEX MICROSCOPIC
Bilirubin, UA: NEGATIVE
Glucose, UA: NEGATIVE
Ketones, UA: NEGATIVE
Leukocytes,UA: NEGATIVE
Nitrite, UA: NEGATIVE
Protein,UA: NEGATIVE
RBC, UA: NEGATIVE
Specific Gravity, UA: 1.03 (ref 1.005–1.030)
Urobilinogen, Ur: 0.2 mg/dL (ref 0.2–1.0)
pH, UA: 5.5 (ref 5.0–7.5)

## 2023-05-04 NOTE — Progress Notes (Signed)
Assessment: 1. Elevated PSA      Plan: Today I had a long discussion with the patient regarding elevated PSA including the issues and controversies regarding prostate cancer early detection.  We discussed options for further evaluation. Following our discussion, we will obtain iso-psa today Patient will return in approximately 2 weeks to review results of that and make further plans based on findings.  Chief Complaint: elevated psa  History of Present Illness:  Adrian Foley is a 72 y.o. male with a history of coronary artery disease status post CABG 2017, type 2 diabetes, history of kidney stones who is seen in consultation from [name], MD for evaluation of elevated PSA.  There is no family history of prostate cancer.  Patient denies any current GU complaints.  He reports minimal lower urinary tract symptoms. IPSS = 5  DRE reveals a large benign feeling prostate  Past Medical History:  Past Medical History:  Diagnosis Date   CAD (coronary artery disease)    CABG 2017   Diabetes mellitus without complication ([facility])    type 2 --metformin stopped 04/2016 due to taste side effect   Kidney stones    Mixed hyperlipidemia    NSTEMI (non-ST elevated myocardial infarction) ([facility])    Seizures ([facility])    post-traumatic (pediatric) initially, then was medicated for a few years, then was weened off med and has been seizure-free since.    Past Surgical History:  Past Surgical History:  Procedure Laterality Date   CARDIAC CATHETERIZATION N/A 12/23/2015   Procedure: Left Heart Cath and Coronary Angiography;  Surgeon: Marykay Lex, MD;  Location: [facility] INVASIVE CV LAB;  Service: Cardiovascular;  Laterality: N/A;  any available cath attending   CORONARY ANGIOPLASTY WITH STENT PLACEMENT     approx age 5 per pt report   CORONARY ARTERY BYPASS GRAFT N/A 12/29/2015   Procedure: CORONARY ARTERY BYPASS GRAFTING (CABG) x four,  using left internal mammary artery and right leg greater  saphenous vein harvested endoscopically;  Surgeon: [name], MD;  Location: [facility] OR;  Service: Open Heart Surgery;  Laterality: N/A;   INGUINAL HERNIA REPAIR Right 07/19/2018   Procedure: RIGHT HERNIA REPAIR INCARCERATED INGUINAL ADULT;  Surgeon: Andria Meuse, MD;  Location[facility];  Service: General;  Laterality: Right;   INSERTION OF MESH Right 07/19/2018   Procedure: INSERTION OF MESH;  Surgeon: Andria Meuse, MD;  Location: [facility];  Service: General;  Laterality: Right;   TEE WITHOUT CARDIOVERSION N/A 12/29/2015   Procedure: TRANSESOPHAGEAL ECHOCARDIOGRAM (TEE);  Surgeon: [name], MD;  Location: [facility] OR;  Service: Open Heart Surgery;  Laterality: N/A;    Allergies:  Allergies  Allergen Reactions   Atorvastatin Other (See Comments)    myalgias   Simvastatin Other (See Comments)    Myalgias/arthralgias    Family History:  Family History  Problem Relation Age of Onset   Early death Mother    Alcohol abuse Father    Early death Father    Depression Sister    Arthritis Sister    Early death Sister    Heart disease Maternal Aunt    Heart disease Maternal Uncle     Social History:  Social History   Tobacco Use   Smoking status: Former   Smokeless tobacco: Former    Types: Chew    Quit date: 05/23/2015  Substance Use Topics   Alcohol use: No   Drug use: No    Review of symptoms:  Constitutional:  Negative for unexplained weight loss, night sweats, fever, chills ENT:  Negative for nose bleeds, sinus pain, painful swallowing CV:  Negative for chest pain, shortness of breath, exercise intolerance, palpitations, loss of consciousness Resp:  Negative for cough, wheezing, shortness of breath GI:  Negative for nausea, vomiting, diarrhea, bloody stools GU:  Positives noted in HPI; otherwise negative for gross hematuria, dysuria, urinary incontinence Neuro:  Negative for seizures, poor balance, limb weakness, slurred speech Psych:  Negative for lack of energy,  depression, anxiety Endocrine:  Negative for polydipsia, polyuria, symptoms of hypoglycemia (dizziness, hunger, sweating) Hematologic:  Negative for anemia, purpura, petechia, prolonged or excessive bleeding, use of anticoagulants  Allergic:  Negative for difficulty breathing or choking as a result of exposure to anything; no shellfish allergy; no allergic response (rash/itch) to materials, foods  Physical exam: BP (!) 150/81   Pulse (!) 58   Ht 5\' 9"  (1.753 m)   Wt 190 lb (86.2 kg)   BMI 28.06 kg/m  GENERAL APPEARANCE:  Well appearing, well developed, well nourished, NAD  GU: Normal external genitalia DRE: Normal sphincter tone; prostate is very large approximately 80 g without evidence of nodules or induration  Results: UA negative

## 2023-05-09 ENCOUNTER — Encounter: Payer: Self-pay | Admitting: Urology

## 2023-05-19 ENCOUNTER — Ambulatory Visit: Payer: Medicare Other | Admitting: Family Medicine

## 2023-05-25 ENCOUNTER — Ambulatory Visit: Payer: Medicare Other | Admitting: Urology

## 2023-05-27 ENCOUNTER — Ambulatory Visit (INDEPENDENT_AMBULATORY_CARE_PROVIDER_SITE_OTHER): Payer: Medicare Other | Admitting: Urology

## 2023-05-27 ENCOUNTER — Encounter: Payer: Self-pay | Admitting: Urology

## 2023-05-27 VITALS — BP 149/76 | HR 61 | Ht 69.0 in | Wt 190.0 lb

## 2023-05-27 DIAGNOSIS — R972 Elevated prostate specific antigen [PSA]: Secondary | ICD-10-CM | POA: Diagnosis not present

## 2023-05-27 LAB — URINALYSIS, ROUTINE W REFLEX MICROSCOPIC
Bilirubin, UA: NEGATIVE
Ketones, UA: NEGATIVE
Leukocytes,UA: NEGATIVE
Nitrite, UA: NEGATIVE
Protein,UA: NEGATIVE
RBC, UA: NEGATIVE
Specific Gravity, UA: 1.03 (ref 1.005–1.030)
Urobilinogen, Ur: 0.2 mg/dL (ref 0.2–1.0)
pH, UA: 5.5 (ref 5.0–7.5)

## 2023-05-27 NOTE — Addendum Note (Signed)
Addended by: Malva Cogan L on: 05/27/2023 01:00 PM   Modules accepted: Orders

## 2023-05-27 NOTE — Progress Notes (Signed)
Assessment: 1. Elevated PSA     Plan: Today had a long and detailed discussion with the patient regarding his elevated PSA along with the issues and controversies regarding prostate cancer early detection.  We discussed options for further evaluation including prostate imaging and biopsy. Following our discussion today patient elects to proceed with transrectal ultrasound prostate biopsy.  Nature procedure discussed in detail today including potential adverse events and complications especially emphasizing infection risk.  Patient does take aspirin periodically and will hold prior to procedure for 1 week.  Schedule next available.  Chief Complaint: Elevated psa  HPI: Adrian Foley is a 72 y.o. male who presents for continued evaluation of elevated PSA. Please see my note 05/04/2023 at the time of initial visit for detailed history and exam. Patient had not undergone regular routine PSA testing. PSA 02/2023 = 10.04.  Prior PSA 12 years ago = 3.59.  Given his lack of regular PSA testing we repeated a PSA and he returns today for further discussions of treatment options. 04/2023 total PSA =13.95  iso PSA elevated at 7.5  There is no family history of prostate cancer.  Patient denies any current GU complaints.  He reports minimal lower urinary tract symptoms. IPSS = 5   DRE reveals a large benign feeling prostate  Portions of the above documentation were copied from a prior visit for review purposes only.  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    PSH: Past Surgical History:  Procedure Laterality Date   CARDIAC CATHETERIZATION N/A 12/23/2015   Procedure: Left Heart Cath and Coronary Angiography;  Surgeon: Marykay Lex, MD;  Location: [facility] INVASIVE CV LAB;  Service: Cardiovascular;  Laterality: N/A;  any available cath attending   CORONARY ANGIOPLASTY WITH STENT PLACEMENT     approx age 59 per pt report   CORONARY ARTERY BYPASS GRAFT N/A 12/29/2015   Procedure: CORONARY ARTERY BYPASS GRAFTING (CABG) x four,  using left internal mammary artery and right leg greater saphenous vein harvested endoscopically;  Surgeon: [name], MD;  Location[facility] OR;  Service: Open Heart Surgery;  Laterality: N/A;   INGUINAL HERNIA REPAIR Right 07/19/2018   Procedure: RIGHT HERNIA REPAIR INCARCERATED INGUINAL ADULT;  Surgeon: Andria Meuse, MD;  Location: [facility] OR;  Service: General;  Laterality: Right;   INSERTION OF MESH Right 07/19/2018   Procedure: INSERTION OF MESH;  Surgeon: Andria Meuse, MD;  Location: [facility] OR;  Service: General;  Laterality: Right;   TEE WITHOUT CARDIOVERSION N/A 12/29/2015   Procedure: TRANSESOPHAGEAL ECHOCARDIOGRAM (TEE);  Surgeon: [name], MD;  Location: [facility] OR;  Service: Open Heart Surgery;  Laterality: N/A;    SH: Social History   Tobacco Use   Smoking status: Former   Smokeless tobacco: Former    Types: Chew    Quit date: 05/23/2015  Substance Use Topics   Alcohol use:  No   Drug use: No    ROS: Constitutional:  Negative for fever, chills, weight loss CV: Negative for chest pain, previous MI, hypertension Respiratory:  Negative for shortness of breath, wheezing, sleep apnea, frequent cough GI:  Negative for nausea, vomiting, bloody stool, GERD  PE: BP (!) 149/76   Pulse 61   Ht 5\' 9"  (1.753 m)   Wt 190 lb (86.2 kg)   BMI 28.06 kg/m  GENERAL APPEARANCE:  Well  appearing, well developed, well nourished, NAD    Results: UA neg

## 2023-05-27 NOTE — Patient Instructions (Signed)
Prostate Biopsy Instructions  Stop all aspirin or blood thinners (aspirin, plavix, coumadin, warfarin, motrin, ibuprofen, advil, aleve, naproxen, naprosyn) for 7 days prior to the procedure.  If you have any questions about stopping these medications, please contact your primary care physician or cardiologist.  Having a light meal prior to the procedure is recommended.  If you are diabetic or have low blood sugar please bring a small snack or glucose tablet.  A Fleets enema is needed and can be purchased over the counter at a local pharmacy. This will need to be administered 2 hours prior to your procedure. Antibiotics will be administered in the clinic at the time of the procedure unless otherwise specified.    If you have any questions or concerns, please feel free to call the office at [phone] or send a Mychart message.   Thank you,  Staff at [facility]  

## 2023-06-08 ENCOUNTER — Ambulatory Visit (INDEPENDENT_AMBULATORY_CARE_PROVIDER_SITE_OTHER): Payer: Medicare Other

## 2023-06-08 VITALS — Wt 190.0 lb

## 2023-06-08 DIAGNOSIS — Z0000 Encounter for general adult medical examination without abnormal findings: Secondary | ICD-10-CM

## 2023-06-08 NOTE — Patient Instructions (Signed)
Adrian Foley , Thank you for taking time to come for your Medicare Wellness Visit. I appreciate your ongoing commitment to your health goals. Please review the following plan we discussed and let me know if I can assist you in the future.   These are the goals we discussed:  Goals      Patient Stated     Stay healthy         This is a list of the screening recommended for you and due dates:  Health Maintenance  Topic Date Due   Eye exam for diabetics  12/20/2014   Complete foot exam   06/13/2019   Pneumonia Vaccine (1 of 1 - PCV) 02/28/2024*   Hepatitis C Screening  02/28/2024*   Colon Cancer Screening  01/19/2026*   Flu Shot  07/21/2023   Hemoglobin A1C  08/31/2023   Yearly kidney function blood test for diabetes  02/28/2024   Yearly kidney health urinalysis for diabetes  02/28/2024   Medicare Annual Wellness Visit  06/07/2024   HPV Vaccine  Aged Out   DTaP/Tdap/Td vaccine  Discontinued   COVID-19 Vaccine  Discontinued   Zoster (Shingles) Vaccine  Discontinued  *Topic was postponed. The date shown is not the original due date.    Advanced directives: Advance directive discussed with you today. I have provided a copy for you to complete at home and have notarized. Once this is complete please bring a copy in to our office so we can scan it into your chart.  Conditions/risks identified: stay healthy   Next appointment: Follow up in one year for your annual wellness visit.   Preventive Care 71 Years and Older, Male  Preventive care refers to lifestyle choices and visits with your health care provider that can promote health and wellness. What does preventive care include? A yearly physical exam. This is also called an annual well check. Dental exams once or twice a year. Routine eye exams. Ask your health care provider how often you should have your eyes checked. Personal lifestyle choices, including: Daily care of your teeth and gums. Regular physical activity. Eating a  healthy diet. Avoiding tobacco and drug use. Limiting alcohol use. Practicing safe sex. Taking low doses of aspirin every day. Taking vitamin and mineral supplements as recommended by your health care provider. What happens during an annual well check? The services and screenings done by your health care provider during your annual well check will depend on your age, overall health, lifestyle risk factors, and family history of disease. Counseling  Your health care provider may ask you questions about your: Alcohol use. Tobacco use. Drug use. Emotional well-being. Home and relationship well-being. Sexual activity. Eating habits. History of falls. Memory and ability to understand (cognition). Work and work Astronomer. Screening  You may have the following tests or measurements: Height, weight, and BMI. Blood pressure. Lipid and cholesterol levels. These may be checked every 5 years, or more frequently if you are over 70 years old. Skin check. Lung cancer screening. You may have this screening every year starting at age 31 if you have a 30-pack-year history of smoking and currently smoke or have quit within the past 15 years. Fecal occult blood test (FOBT) of the stool. You may have this test every year starting at age 38. Flexible sigmoidoscopy or colonoscopy. You may have a sigmoidoscopy every 5 years or a colonoscopy every 10 years starting at age 72. Prostate cancer screening. Recommendations will vary depending on your family history and other  risks. Hepatitis C blood test. Hepatitis B blood test. Sexually transmitted disease (STD) testing. Diabetes screening. This is done by checking your blood sugar (glucose) after you have not eaten for a while (fasting). You may have this done every 1-3 years. Abdominal aortic aneurysm (AAA) screening. You may need this if you are a current or former smoker. Osteoporosis. You may be screened starting at age 57 if you are at high risk. Talk  with your health care provider about your test results, treatment options, and if necessary, the need for more tests. Vaccines  Your health care provider may recommend certain vaccines, such as: Influenza vaccine. This is recommended every year. Tetanus, diphtheria, and acellular pertussis (Tdap, Td) vaccine. You may need a Td booster every 10 years. Zoster vaccine. You may need this after age 69. Pneumococcal 13-valent conjugate (PCV13) vaccine. One dose is recommended after age 46. Pneumococcal polysaccharide (PPSV23) vaccine. One dose is recommended after age 59. Talk to your health care provider about which screenings and vaccines you need and how often you need them. This information is not intended to replace advice given to you by your health care provider. Make sure you discuss any questions you have with your health care provider. Document Released: 01/02/2016 Document Revised: 08/25/2016 Document Reviewed: 10/07/2015 Elsevier Interactive Patient Education  2017 [place] Prevention in the Home Falls can cause injuries. They can happen to people of all ages. There are many things you can do to make your home safe and to help prevent falls. What can I do on the outside of my home? Regularly fix the edges of walkways and driveways and fix any cracks. Remove anything that might make you trip as you walk through a door, such as a raised step or threshold. Trim any bushes or trees on the path to your home. Use bright outdoor lighting. Clear any walking paths of anything that might make someone trip, such as rocks or tools. Regularly check to see if handrails are loose or broken. Make sure that both sides of any steps have handrails. Any raised decks and porches should have guardrails on the edges. Have any leaves, snow, or ice cleared regularly. Use sand or salt on walking paths during winter. Clean up any spills in your garage right away. This includes oil or grease  spills. What can I do in the bathroom? Use night lights. Install grab bars by the toilet and in the tub and shower. Do not use towel bars as grab bars. Use non-skid mats or decals in the tub or shower. If you need to sit down in the shower, use a plastic, non-slip stool. Keep the floor dry. Clean up any water that spills on the floor as soon as it happens. Remove soap buildup in the tub or shower regularly. Attach bath mats securely with double-sided non-slip rug tape. Do not have throw rugs and other things on the floor that can make you trip. What can I do in the bedroom? Use night lights. Make sure that you have a light by your bed that is easy to reach. Do not use any sheets or blankets that are too big for your bed. They should not hang down onto the floor. Have a firm chair that has side arms. You can use this for support while you get dressed. Do not have throw rugs and other things on the floor that can make you trip. What can I do in the kitchen? Clean up any spills right away.  Avoid walking on wet floors. Keep items that you use a lot in easy-to-reach places. If you need to reach something above you, use a strong step stool that has a grab bar. Keep electrical cords out of the way. Do not use floor polish or wax that makes floors slippery. If you must use wax, use non-skid floor wax. Do not have throw rugs and other things on the floor that can make you trip. What can I do with my stairs? Do not leave any items on the stairs. Make sure that there are handrails on both sides of the stairs and use them. Fix handrails that are broken or loose. Make sure that handrails are as long as the stairways. Check any carpeting to make sure that it is firmly attached to the stairs. Fix any carpet that is loose or worn. Avoid having throw rugs at the top or bottom of the stairs. If you do have throw rugs, attach them to the floor with carpet tape. Make sure that you have a light switch at the  top of the stairs and the bottom of the stairs. If you do not have them, ask someone to add them for you. What else can I do to help prevent falls? Wear shoes that: Do not have high heels. Have rubber bottoms. Are comfortable and fit you well. Are closed at the toe. Do not wear sandals. If you use a stepladder: Make sure that it is fully opened. Do not climb a closed stepladder. Make sure that both sides of the stepladder are locked into place. Ask someone to hold it for you, if possible. Clearly mark and make sure that you can see: Any grab bars or handrails. First and last steps. Where the edge of each step is. Use tools that help you move around (mobility aids) if they are needed. These include: Canes. Walkers. Scooters. Crutches. Turn on the lights when you go into a dark area. Replace any light bulbs as soon as they burn out. Set up your furniture so you have a clear path. Avoid moving your furniture around. If any of your floors are uneven, fix them. If there are any pets around you, be aware of where they are. Review your medicines with your doctor. Some medicines can make you feel dizzy. This can increase your chance of falling. Ask your doctor what other things that you can do to help prevent falls. This information is not intended to replace advice given to you by your health care provider. Make sure you discuss any questions you have with your health care provider. Document Released: 10/02/2009 Document Revised: 05/13/2016 Document Reviewed: 01/10/2015 Elsevier Interactive Patient Education  2017 [employer].

## 2023-06-08 NOTE — Progress Notes (Signed)
Subjective:   Adrian Foley is a 72 y.o. male who presents for Medicare Annual/Subsequent preventive examination.  Visit Complete: Virtual  I connected with  Adrian Foley on 06/08/23 by a audio enabled telemedicine application and verified that I am speaking with the correct person using two identifiers.  Patient Location: Home  Provider Location: Home Office  I discussed the limitations of evaluation and management by telemedicine. The patient expressed understanding and agreed to proceed.  Review of Systems     Cardiac Risk Factors include: advanced age (>65men, >41 women);dyslipidemia;male gender;diabetes mellitus     Objective:    Today's Vitals   06/08/23 1135  Weight: 190 lb (86.2 kg)   Body mass index is 28.06 kg/m.     06/08/2023   11:38 AM 12/21/2015    2:27 AM 12/20/2015    9:21 AM 10/06/2015   11:27 PM  Advanced Directives  Does Patient Have a Medical Advance Directive? No No No No  Would patient like information on creating a medical advance directive? Yes (MAU/Ambulatory/Procedural Areas - Information given) No - patient declined information No - patient declined information     Current Medications (verified) Outpatient Encounter Medications as of 06/08/2023  Medication Sig   aspirin 81 MG tablet Take 81 mg by mouth daily.   nitroGLYCERIN (NITROSTAT) 0.4 MG SL tablet Place 1 tablet (0.4 mg total) under the tongue every 5 (five) minutes as needed for chest pain. (Patient not taking: Reported on 06/08/2023)   rosuvastatin (CRESTOR) 5 MG tablet 1 tab po three days a week (Patient not taking: Reported on 06/08/2023)   [DISCONTINUED] SODIUM FLUORIDE 5000 PPM 1.1 % PSTE Take by mouth at bedtime.   No facility-administered encounter medications on file as of 06/08/2023.    Allergies (verified) Atorvastatin and Simvastatin   History: Past Medical History:  Diagnosis Date   CAD (coronary artery disease)    CABG 2017   Diabetes mellitus without complication  (HCC)    type 2 --metformin stopped 04/2016 due to taste side effect   Kidney stones    Mixed hyperlipidemia    NSTEMI (non-ST elevated myocardial infarction) (HCC)    Seizures (HCC)    post-traumatic (pediatric) initially, then was medicated for a few years, then was weened off med and has been seizure-free since.   Past Surgical History:  Procedure Laterality Date   CARDIAC CATHETERIZATION N/A 12/23/2015   Procedure: Left Heart Cath and Coronary Angiography;  Surgeon: Marykay Lex, MD;  Location: [facility] INVASIVE CV LAB;  Service: Cardiovascular;  Laterality: N/A;  any available cath attending   CORONARY ANGIOPLASTY WITH STENT PLACEMENT     approx age 65 per pt report   CORONARY ARTERY BYPASS GRAFT N/A 12/29/2015   Procedure: CORONARY ARTERY BYPASS GRAFTING (CABG) x four,  using left internal mammary artery and right leg greater saphenous vein harvested endoscopically;  Surgeon: [name], MD;  Location: [facility] OR;  Service: Open Heart Surgery;  Laterality: N/A;   INGUINAL HERNIA REPAIR Right 07/19/2018   Procedure: RIGHT HERNIA REPAIR INCARCERATED INGUINAL ADULT;  Surgeon: Andria Meuse, MD;  Location: [facility] OR;  Service: General;  Laterality: Right;   INSERTION OF MESH Right 07/19/2018   Procedure: INSERTION OF MESH;  Surgeon: Andria Meuse, MD;  Location: [facility] OR;  Service: General;  Laterality: Right;   TEE WITHOUT CARDIOVERSION N/A 12/29/2015   Procedure: TRANSESOPHAGEAL ECHOCARDIOGRAM (TEE);  Surgeon: [name], MD;  Location: [facility] OR;  Service: Open Heart Surgery;  Laterality: N/A;   Family History  Problem Relation Age of Onset   Early death Mother    Alcohol abuse Father    Early death Father    Depression Sister    Arthritis Sister    Early death Sister    Heart disease Maternal Aunt    Heart disease Maternal Uncle    Social History   Socioeconomic History   Marital status: Married    Spouse name: Not on file   Number of children: Not on file   Years of  education: Not on file   Highest education level: Not on file  Occupational History   Not on file  Tobacco Use   Smoking status: Former   Smokeless tobacco: Former    Types: Chew    Quit date: 05/23/2015  Substance and Sexual Activity   Alcohol use: No   Drug use: No   Sexual activity: Not on file  Other Topics Concern   Not on file  Social History Narrative   Married, 3 children, 6 grandkids.   Lives in [place].   Occup: carpentry   Tob: 30 pack-yr hx, quit 2009.  Chewed tobacco x "years"-quit around age 22.   Alc: none   Social Determinants of Health   Financial Resource Strain: Low Risk  (06/08/2023)   Overall Financial Resource Strain (CARDIA)    Difficulty of Paying Living Expenses: Not hard at all  Food Insecurity: No Food Insecurity (06/08/2023)   Hunger Vital Sign    Worried About Running Out of Food in the Last Year: Never true    Ran Out of Food in the Last Year: Never true  Transportation Needs: No Transportation Needs (06/08/2023)   PRAPARE - Administrator, Civil Service (Medical): No    Lack of Transportation (Non-Medical): No  Physical Activity: Inactive (06/08/2023)   Exercise Vital Sign    Days of Exercise per Week: 0 days    Minutes of Exercise per Session: 0 min  Stress: No Stress Concern Present (06/08/2023)   [employer] of Occupational Health - Occupational Stress Questionnaire    Feeling of Stress : Not at all  Social Connections: Moderately Isolated (06/08/2023)   Social Connection and Isolation Panel [NHANES]    Frequency of Communication with Friends and Family: More than three times a week    Frequency of Social Gatherings with Friends and Family: More than three times a week    Attends Religious Services: Never    Database administrator or Organizations: No    Attends Engineer, structural: Never    Marital Status: Married    Tobacco Counseling Counseling given: Not Answered   Clinical Intake:  Pre-visit  preparation completed: Yes  Pain : No/denies pain     BMI - recorded: 28.06 Nutritional Status: BMI 25 -29 Overweight Nutritional Risks: None Diabetes: Yes CBG done?: No Did pt. bring in CBG monitor from home?: No  How often do you need to have someone help you when you read instructions, pamphlets, or other written materials from your doctor or pharmacy?: 1 - Never  Interpreter Needed?: No  Information entered by :: Lanier Ensign, LPN   Activities of Daily Living    06/08/2023   11:39 AM  In your present state of health, do you have any difficulty performing the following activities:  Hearing? 1  Comment slight loss  Vision? 0  Difficulty concentrating or making decisions? 0  Walking or climbing stairs? 0  Dressing or  bathing? 0  Doing errands, shopping? 0  Preparing Food and eating ? N  Using the Toilet? N  In the past six months, have you accidently leaked urine? N  Do you have problems with loss of bowel control? N  Managing your Medications? N  Managing your Finances? N  Housekeeping or managing your Housekeeping? N    Patient Care Team: [name], MD as PCP - General (Family Medicine)  Indicate any recent Medical Services you may have received from other than [facility] providers in the past year (date may be approximate).     Assessment:   This is a routine wellness examination for Adrian Foley.  Hearing/Vision screen Hearing Screening - Comments:: Pt slight hearing loss  Vision Screening - Comments:: Encouraged to follow up   Dietary issues and exercise activities discussed:     Goals Addressed             This Visit's Progress    Patient Stated       Stay healthy        Depression Screen    06/08/2023   11:37 AM 02/28/2023   10:16 AM 06/12/2018    8:41 AM 07/19/2016    5:00 PM  PHQ 2/9 Scores  PHQ - 2 Score 0 0 0 0    Fall Risk    06/08/2023   11:39 AM 02/28/2023   10:16 AM 06/12/2018    8:41 AM 07/19/2016    5:00 PM  Fall Risk    Falls in the past year? 0 1 No No  Number falls in past yr: 0 1    Injury with Fall? 0 0    Risk for fall due to : Impaired vision No Fall Risks    Follow up Falls prevention discussed Falls evaluation completed      MEDICARE RISK AT HOME:  Medicare Risk at Home - 06/08/23 1140     Any stairs in or around the home? No    If so, are there any without handrails? No    Home free of loose throw rugs in walkways, pet beds, electrical cords, etc? Yes    Adequate lighting in your home to reduce risk of falls? Yes    Life alert? No    Use of a cane, walker or w/c? No    Grab bars in the bathroom? No    Shower chair or bench in shower? No    Elevated toilet seat or a handicapped toilet? No             TIMED UP AND GO:  Was the test performed?  No    Cognitive Function:        06/08/2023   11:41 AM  6CIT Screen  What Year? 0 points  What month? 0 points  What time? 0 points  Count back from 20 0 points  Months in reverse 0 points  Repeat phrase 0 points  Total Score 0 points    Immunizations Immunization History  Administered Date(s) Administered   Td 07/13/2010    TDAP status: Up to date  Flu Vaccine status: Declined, Education has been provided regarding the importance of this vaccine but patient still declined. Advised may receive this vaccine at local pharmacy or Health Dept. Aware to provide a copy of the vaccination record if obtained from local pharmacy or Health Dept. Verbalized acceptance and understanding.  Pneumococcal vaccine status: Declined,  Education has been provided regarding the importance of this vaccine but patient still declined.  Advised may receive this vaccine at local pharmacy or Health Dept. Aware to provide a copy of the vaccination record if obtained from local pharmacy or Health Dept. Verbalized acceptance and understanding.   Covid-19 vaccine status: Declined, Education has been provided regarding the importance of this vaccine but  patient still declined. Advised may receive this vaccine at local pharmacy or Health Dept.or vaccine clinic. Aware to provide a copy of the vaccination record if obtained from local pharmacy or Health Dept. Verbalized acceptance and understanding.  Qualifies for Shingles Vaccine? Yes   Zostavax completed No   Shingrix Completed?: No.    Education has been provided regarding the importance of this vaccine. Patient has been advised to call insurance company to determine out of pocket expense if they have not yet received this vaccine. Advised may also receive vaccine at local pharmacy or Health Dept. Verbalized acceptance and understanding.  Screening Tests Health Maintenance  Topic Date Due   OPHTHALMOLOGY EXAM  12/20/2014   FOOT EXAM  06/13/2019   Pneumonia Vaccine 39+ Years old (1 of 1 - PCV) 02/28/2024 (Originally 08/20/2017)   Hepatitis C Screening  02/28/2024 (Originally 08/20/1969)   Colonoscopy  01/19/2026 (Originally 08/20/1997)   INFLUENZA VACCINE  07/21/2023   HEMOGLOBIN A1C  08/31/2023   Diabetic kidney evaluation - eGFR measurement  02/28/2024   Diabetic kidney evaluation - Urine ACR  02/28/2024   Medicare Annual Wellness (AWV)  06/07/2024   HPV VACCINES  Aged Out   DTaP/Tdap/Td  Discontinued   COVID-19 Vaccine  Discontinued   Zoster Vaccines- Shingrix  Discontinued    Health Maintenance  Health Maintenance Due  Topic Date Due   OPHTHALMOLOGY EXAM  12/20/2014   FOOT EXAM  06/13/2019    Postponed colonoscopy   Additional Screening:  Hepatitis C Screening: does qualify;   Vision Screening: Recommended annual ophthalmology exams for early detection of glaucoma and other disorders of the eye. Is the patient up to date with their annual eye exam?  No  Who is the provider or what is the name of the office in which the patient attends annual eye exams? Encouraged to follow up  If pt is not established with a provider, would they like to be referred to a provider to establish  care? No .   Dental Screening: Recommended annual dental exams for proper oral hygiene  Diabetic Foot Exam: Diabetic Foot Exam: Overdue, Pt has been advised about the importance in completing this exam. Pt is scheduled for diabetic foot exam on next appt .  Community Resource Referral / Chronic Care Management: CRR required this visit?  No   CCM required this visit?  No     Plan:     I have personally reviewed and noted the following in the patient's chart:   Medical and social history Use of alcohol, tobacco or illicit drugs  Current medications and supplements including opioid prescriptions. Patient is not currently taking opioid prescriptions. Functional ability and status Nutritional status Physical activity Advanced directives List of other physicians Hospitalizations, surgeries, and ER visits in previous 12 months Vitals Screenings to include cognitive, depression, and falls Referrals and appointments  In addition, I have reviewed and discussed with patient certain preventive protocols, quality metrics, and best practice recommendations. A written personalized care plan for preventive services as well as general preventive health recommendations were provided to patient.     Marzella Schlein, LPN   1/61/0960   After Visit Summary: (Mail) Due to this being a telephonic visit,  the after visit summary with patients personalized plan was offered to patient via mail   Nurse Notes: none

## 2023-06-15 ENCOUNTER — Other Ambulatory Visit: Payer: Medicare Other | Admitting: Urology

## 2023-06-15 ENCOUNTER — Ambulatory Visit (HOSPITAL_BASED_OUTPATIENT_CLINIC_OR_DEPARTMENT_OTHER): Payer: Medicare Other

## 2023-07-11 ENCOUNTER — Inpatient Hospital Stay (HOSPITAL_COMMUNITY): Payer: Medicare Other

## 2023-07-11 ENCOUNTER — Emergency Department (HOSPITAL_COMMUNITY): Payer: Medicare Other

## 2023-07-11 ENCOUNTER — Other Ambulatory Visit: Payer: Self-pay

## 2023-07-11 ENCOUNTER — Encounter (HOSPITAL_COMMUNITY): Payer: Self-pay

## 2023-07-11 ENCOUNTER — Encounter (HOSPITAL_COMMUNITY)
Admission: EM | Disposition: A | Discharge status: Home / Self Care | Payer: Self-pay | Source: Home / Self Care | Attending: Cardiology

## 2023-07-11 ENCOUNTER — Inpatient Hospital Stay (HOSPITAL_COMMUNITY)
Admission: EM | Admit: 2023-07-11 | Discharge: 2023-07-12 | DRG: 322 | Disposition: A | Discharge status: Home / Self Care | Payer: Medicare Other | Attending: Cardiology | Admitting: Cardiology

## 2023-07-11 ENCOUNTER — Other Ambulatory Visit (HOSPITAL_COMMUNITY): Payer: Self-pay

## 2023-07-11 DIAGNOSIS — E782 Mixed hyperlipidemia: Secondary | ICD-10-CM | POA: Diagnosis not present

## 2023-07-11 DIAGNOSIS — Z811 Family history of alcohol abuse and dependence: Secondary | ICD-10-CM

## 2023-07-11 DIAGNOSIS — Z7982 Long term (current) use of aspirin: Secondary | ICD-10-CM | POA: Diagnosis not present

## 2023-07-11 DIAGNOSIS — I2119 ST elevation (STEMI) myocardial infarction involving other coronary artery of inferior wall: Secondary | ICD-10-CM | POA: Diagnosis not present

## 2023-07-11 DIAGNOSIS — I10 Essential (primary) hypertension: Secondary | ICD-10-CM | POA: Diagnosis not present

## 2023-07-11 DIAGNOSIS — I252 Old myocardial infarction: Secondary | ICD-10-CM | POA: Diagnosis not present

## 2023-07-11 DIAGNOSIS — E1169 Type 2 diabetes mellitus with other specified complication: Secondary | ICD-10-CM | POA: Diagnosis not present

## 2023-07-11 DIAGNOSIS — I25110 Atherosclerotic heart disease of native coronary artery with unstable angina pectoris: Secondary | ICD-10-CM | POA: Diagnosis not present

## 2023-07-11 DIAGNOSIS — Z87891 Personal history of nicotine dependence: Secondary | ICD-10-CM | POA: Diagnosis not present

## 2023-07-11 DIAGNOSIS — I25709 Atherosclerosis of coronary artery bypass graft(s), unspecified, with unspecified angina pectoris: Secondary | ICD-10-CM | POA: Diagnosis present

## 2023-07-11 DIAGNOSIS — E1159 Type 2 diabetes mellitus with other circulatory complications: Secondary | ICD-10-CM | POA: Diagnosis not present

## 2023-07-11 DIAGNOSIS — Z888 Allergy status to other drugs, medicaments and biological substances status: Secondary | ICD-10-CM | POA: Diagnosis not present

## 2023-07-11 DIAGNOSIS — I25118 Atherosclerotic heart disease of native coronary artery with other forms of angina pectoris: Secondary | ICD-10-CM | POA: Diagnosis not present

## 2023-07-11 DIAGNOSIS — Z8249 Family history of ischemic heart disease and other diseases of the circulatory system: Secondary | ICD-10-CM

## 2023-07-11 DIAGNOSIS — E785 Hyperlipidemia, unspecified: Secondary | ICD-10-CM | POA: Diagnosis not present

## 2023-07-11 DIAGNOSIS — Z955 Presence of coronary angioplasty implant and graft: Secondary | ICD-10-CM

## 2023-07-11 DIAGNOSIS — Z8261 Family history of arthritis: Secondary | ICD-10-CM | POA: Diagnosis not present

## 2023-07-11 DIAGNOSIS — I472 Ventricular tachycardia, unspecified: Secondary | ICD-10-CM | POA: Diagnosis not present

## 2023-07-11 DIAGNOSIS — I25718 Atherosclerosis of autologous vein coronary artery bypass graft(s) with other forms of angina pectoris: Secondary | ICD-10-CM

## 2023-07-11 DIAGNOSIS — I2510 Atherosclerotic heart disease of native coronary artery without angina pectoris: Secondary | ICD-10-CM | POA: Diagnosis not present

## 2023-07-11 DIAGNOSIS — I213 ST elevation (STEMI) myocardial infarction of unspecified site: Principal | ICD-10-CM

## 2023-07-11 DIAGNOSIS — Z951 Presence of aortocoronary bypass graft: Secondary | ICD-10-CM | POA: Diagnosis not present

## 2023-07-11 HISTORY — PX: CORONARY/GRAFT ACUTE MI REVASCULARIZATION: CATH118305

## 2023-07-11 HISTORY — PX: CORONARY THROMBECTOMY: CATH118304

## 2023-07-11 HISTORY — PX: LEFT HEART CATH AND CORS/GRAFTS ANGIOGRAPHY: CATH118250

## 2023-07-11 LAB — LDL CHOLESTEROL, DIRECT: Direct LDL: 113 mg/dL — ABNORMAL HIGH (ref 0–99)

## 2023-07-11 LAB — COMPREHENSIVE METABOLIC PANEL
ALT: 22 U/L (ref 0–44)
ALT: 24 U/L (ref 0–44)
AST: 22 U/L (ref 15–41)
AST: 20 U/L (ref 15–41)
Albumin: 4.1 g/dL (ref 3.5–5.0)
Albumin: 4 g/dL (ref 3.5–5.0)
Alkaline Phosphatase: 72 U/L (ref 38–126)
Alkaline Phosphatase: 70 U/L (ref 38–126)
Anion gap: 13 (ref 5–15)
Anion gap: 15 (ref 5–15)
BUN: 18 mg/dL (ref 8–23)
BUN: 17 mg/dL (ref 8–23)
CO2: 22 mmol/L (ref 22–32)
CO2: 18 mmol/L — ABNORMAL LOW (ref 22–32)
Calcium: 9.5 mg/dL (ref 8.9–10.3)
Calcium: 9.2 mg/dL (ref 8.9–10.3)
Chloride: 103 mmol/L (ref 98–111)
Chloride: 106 mmol/L (ref 98–111)
Creatinine, Ser: 1.19 mg/dL (ref 0.61–1.24)
Creatinine, Ser: 1.1 mg/dL (ref 0.61–1.24)
GFR, Estimated: >60 mL/min (ref >60)
GFR, Estimated: >60 mL/min (ref >60)
Glucose, Bld: 241 mg/dL — ABNORMAL HIGH (ref 70–99)
Glucose, Bld: 259 mg/dL — ABNORMAL HIGH (ref 70–99)
Potassium: 4.3 mmol/L (ref 3.5–5.1)
Potassium: 3.6 mmol/L (ref 3.5–5.1)
Sodium: 138 mmol/L (ref 135–145)
Sodium: 139 mmol/L (ref 135–145)
Total Bilirubin: 1 mg/dL (ref 0.3–1.2)
Total Bilirubin: 1 mg/dL (ref 0.3–1.2)
Total Protein: 7.2 g/dL (ref 6.5–8.1)
Total Protein: 7.2 g/dL (ref 6.5–8.1)

## 2023-07-11 LAB — CBC WITH DIFFERENTIAL/PLATELET
Abs Immature Granulocytes: 0.04 10*3/uL (ref 0.00–0.07)
Abs Immature Granulocytes: 0.09 10*3/uL — ABNORMAL HIGH (ref 0.00–0.07)
Basophils Absolute: 0.1 10*3/uL (ref 0.0–0.1)
Basophils Absolute: 0.1 10*3/uL (ref 0.0–0.1)
Basophils Relative: 1 %
Basophils Relative: 1 %
Eosinophils Absolute: 0.1 10*3/uL (ref 0.0–0.5)
Eosinophils Absolute: 0.1 10*3/uL (ref 0.0–0.5)
Eosinophils Relative: 1 %
Eosinophils Relative: 1 %
HCT: 51.7 % (ref 39.0–52.0)
HCT: 47 % (ref 39.0–52.0)
Hemoglobin: 17.5 g/dL — ABNORMAL HIGH (ref 13.0–17.0)
Hemoglobin: 16.9 g/dL (ref 13.0–17.0)
Immature Granulocytes: 1 %
Immature Granulocytes: 1 %
Lymphocytes Relative: 18 %
Lymphocytes Relative: 15 %
Lymphs Abs: 1.5 10*3/uL (ref 0.7–4.0)
Lymphs Abs: 1.7 10*3/uL (ref 0.7–4.0)
MCH: 30.8 pg (ref 26.0–34.0)
MCH: 31.3 pg (ref 26.0–34.0)
MCHC: 33.8 g/dL (ref 30.0–36.0)
MCHC: 36 g/dL (ref 30.0–36.0)
MCV: 90.9 fL (ref 80.0–100.0)
MCV: 87 fL (ref 80.0–100.0)
Monocytes Absolute: 0.6 10*3/uL (ref 0.1–1.0)
Monocytes Absolute: 0.7 10*3/uL (ref 0.1–1.0)
Monocytes Relative: 7 %
Monocytes Relative: 6 %
Neutro Abs: 6.1 10*3/uL (ref 1.7–7.7)
Neutro Abs: 9 10*3/uL — ABNORMAL HIGH (ref 1.7–7.7)
Neutrophils Relative %: 72 %
Neutrophils Relative %: 76 %
Platelets: 224 10*3/uL (ref 150–400)
Platelets: 245 10*3/uL (ref 150–400)
RBC: 5.69 MIL/uL (ref 4.22–5.81)
RBC: 5.4 MIL/uL (ref 4.22–5.81)
RDW: 12.6 % (ref 11.5–15.5)
RDW: 12.5 % (ref 11.5–15.5)
WBC: 8.4 10*3/uL (ref 4.0–10.5)
WBC: 11.7 10*3/uL — ABNORMAL HIGH (ref 4.0–10.5)
nRBC: 0 % (ref 0.0–0.2)
nRBC: 0 % (ref 0.0–0.2)

## 2023-07-11 LAB — CREATININE, SERUM
Creatinine, Ser: 1.14 mg/dL (ref 0.61–1.24)
GFR, Estimated: >60 mL/min (ref >60)

## 2023-07-11 LAB — CBC
HCT: 49.3 % (ref 39.0–52.0)
Hemoglobin: 17.3 g/dL — ABNORMAL HIGH (ref 13.0–17.0)
MCH: 31.3 pg (ref 26.0–34.0)
MCHC: 35.1 g/dL (ref 30.0–36.0)
MCV: 89.2 fL (ref 80.0–100.0)
Platelets: 178 10*3/uL (ref 150–400)
RBC: 5.53 MIL/uL (ref 4.22–5.81)
RDW: 12.6 % (ref 11.5–15.5)
WBC: 10.8 10*3/uL — ABNORMAL HIGH (ref 4.0–10.5)
nRBC: 0 % (ref 0.0–0.2)

## 2023-07-11 LAB — LIPID PANEL
Cholesterol: 246 mg/dL — ABNORMAL HIGH (ref 0–200)
HDL: 32 mg/dL — ABNORMAL LOW (ref >40)
LDL Cholesterol: UNDETERMINED mg/dL (ref 0–99)
Total CHOL/HDL Ratio: 7.7 RATIO
Triglycerides: 542 mg/dL — ABNORMAL HIGH (ref <150)
VLDL: UNDETERMINED mg/dL (ref 0–40)

## 2023-07-11 LAB — I-STAT CHEM 8, ED
BUN: 19 mg/dL (ref 8–23)
Calcium, Ion: 1.2 mmol/L (ref 1.15–1.40)
Chloride: 107 mmol/L (ref 98–111)
Creatinine, Ser: 1 mg/dL (ref 0.61–1.24)
Glucose, Bld: 270 mg/dL — ABNORMAL HIGH (ref 70–99)
HCT: 48 % (ref 39.0–52.0)
Hemoglobin: 16.3 g/dL (ref 13.0–17.0)
Potassium: 3.7 mmol/L (ref 3.5–5.1)
Sodium: 140 mmol/L (ref 135–145)
TCO2: 21 mmol/L — ABNORMAL LOW (ref 22–32)

## 2023-07-11 LAB — POCT ACTIVATED CLOTTING TIME
Activated Clotting Time: 165 seconds
Activated Clotting Time: 281 seconds
Activated Clotting Time: 354 seconds

## 2023-07-11 LAB — TROPONIN I (HIGH SENSITIVITY)
Troponin I (High Sensitivity): 34 ng/L — ABNORMAL HIGH (ref <18)
Troponin I (High Sensitivity): 35 ng/L — ABNORMAL HIGH (ref <18)
Troponin I (High Sensitivity): 354 ng/L (ref <18)

## 2023-07-11 LAB — PROTIME-INR
INR: 1 (ref 0.8–1.2)
INR: 1.1 (ref 0.8–1.2)
Prothrombin Time: 13.7 seconds (ref 11.4–15.2)
Prothrombin Time: 14.4 seconds (ref 11.4–15.2)

## 2023-07-11 LAB — GLUCOSE, CAPILLARY
Glucose-Capillary: 199 mg/dL — ABNORMAL HIGH (ref 70–99)
Glucose-Capillary: 185 mg/dL — ABNORMAL HIGH (ref 70–99)

## 2023-07-11 LAB — HEMOGLOBIN A1C
Hgb A1c MFr Bld: 8.4 % — ABNORMAL HIGH (ref 4.8–5.6)
Hgb A1c MFr Bld: 8.4 % — ABNORMAL HIGH (ref 4.8–5.6)
Mean Plasma Glucose: 194.38 mg/dL
Mean Plasma Glucose: 194.38 mg/dL

## 2023-07-11 LAB — I-STAT CG4 LACTIC ACID, ED: Lactic Acid, Venous: 3 mmol/L (ref 0.5–1.9)

## 2023-07-11 LAB — APTT
aPTT: 30 seconds (ref 24–36)
aPTT: 105 seconds — ABNORMAL HIGH (ref 24–36)

## 2023-07-11 LAB — MRSA NEXT GEN BY PCR, NASAL: MRSA by PCR Next Gen: NOT DETECTED

## 2023-07-11 SURGERY — LEFT HEART CATH AND CORS/GRAFTS ANGIOGRAPHY
Anesthesia: LOCAL

## 2023-07-11 MED ORDER — ROSUVASTATIN CALCIUM 20 MG PO TABS
40.0000 mg | ORAL_TABLET | Freq: Every day | ORAL | Status: DC
  Administered 2023-07-11 – 2023-07-12 (×2): 40 mg via ORAL
  Filled 2023-07-11 (×2): qty 2

## 2023-07-11 MED ORDER — HEPARIN SODIUM (PORCINE) 5000 UNIT/ML IJ SOLN
4000.0000 [IU] | Freq: Once | INTRAMUSCULAR | Status: AC
  Administered 2023-07-11: 4000 [IU] via INTRAVENOUS

## 2023-07-11 MED ORDER — ONDANSETRON HCL 4 MG/2ML IJ SOLN
4.0000 mg | Freq: Once | INTRAMUSCULAR | Status: AC
  Administered 2023-07-11: 4 mg via INTRAVENOUS
  Filled 2023-07-11: qty 2

## 2023-07-11 MED ORDER — SODIUM CHLORIDE 0.9 % IV SOLN: INTRAVENOUS | Status: DC

## 2023-07-11 MED ORDER — HEPARIN SODIUM (PORCINE) 5000 UNIT/ML IJ SOLN
5000.0000 [IU] | Freq: Three times a day (TID) | INTRAMUSCULAR | Status: DC
  Administered 2023-07-11 – 2023-07-12 (×3): 5000 [IU] via SUBCUTANEOUS
  Filled 2023-07-11 (×3): qty 1

## 2023-07-11 MED ORDER — ASPIRIN 81 MG PO CHEW
81.0000 mg | CHEWABLE_TABLET | Freq: Every day | ORAL | Status: DC
  Filled 2023-07-11 (×2): qty 1

## 2023-07-11 MED ORDER — NITROGLYCERIN 1 MG/10 ML FOR IR/CATH LAB
INTRA_ARTERIAL | Status: DC | PRN
  Administered 2023-07-11 (×2): 200 ug via INTRACORONARY

## 2023-07-11 MED ORDER — TICAGRELOR 90 MG PO TABS
90.0000 mg | ORAL_TABLET | Freq: Two times a day (BID) | ORAL | Status: DC
  Administered 2023-07-11 – 2023-07-12 (×2): 90 mg via ORAL
  Filled 2023-07-11 (×2): qty 1

## 2023-07-11 MED ORDER — ORAL CARE MOUTH RINSE: 15.0000 mL | OROMUCOSAL | Status: DC | PRN

## 2023-07-11 MED ORDER — SODIUM CHLORIDE 0.9 % IV SOLN: INTRAVENOUS | Status: AC

## 2023-07-11 MED ORDER — HEPARIN (PORCINE) IN NACL 1000-0.9 UT/500ML-% IV SOLN
INTRAVENOUS | Status: DC | PRN
  Administered 2023-07-11 (×2): 500 mL

## 2023-07-11 MED ORDER — FENTANYL CITRATE (PF) 100 MCG/2ML IJ SOLN
INTRAMUSCULAR | Status: DC | PRN
  Administered 2023-07-11: 50 ug via INTRAVENOUS
  Administered 2023-07-11 (×2): 25 ug via INTRAVENOUS

## 2023-07-11 MED ORDER — SODIUM CHLORIDE 0.9 % IV SOLN: 4.0000 ug/kg/min | INTRAVENOUS | Status: DC

## 2023-07-11 MED ORDER — TICAGRELOR 90 MG PO TABS
ORAL_TABLET | ORAL | Status: DC | PRN
  Administered 2023-07-11: 180 mg via ORAL

## 2023-07-11 MED ORDER — NITROGLYCERIN 1 MG/10 ML FOR IR/CATH LAB
INTRA_ARTERIAL | Status: AC
  Filled 2023-07-11: qty 10

## 2023-07-11 MED ORDER — ASPIRIN 81 MG PO CHEW
324.0000 mg | CHEWABLE_TABLET | Freq: Once | ORAL | Status: AC
  Administered 2023-07-11: 324 mg via ORAL

## 2023-07-11 MED ORDER — LIDOCAINE HCL (PF) 1 % IJ SOLN
INTRAMUSCULAR | Status: AC
  Filled 2023-07-11: qty 30

## 2023-07-11 MED ORDER — HEPARIN SODIUM (PORCINE) 1000 UNIT/ML IJ SOLN
INTRAMUSCULAR | Status: DC | PRN
  Administered 2023-07-11: 5000 [IU] via INTRAVENOUS

## 2023-07-11 MED ORDER — HYDRALAZINE HCL 20 MG/ML IJ SOLN: 10.0000 mg | INTRAMUSCULAR | Status: AC | PRN

## 2023-07-11 MED ORDER — MORPHINE SULFATE (PF) 2 MG/ML IV SOLN
INTRAVENOUS | Status: DC | PRN
  Administered 2023-07-11: 2 mg via INTRAVENOUS

## 2023-07-11 MED ORDER — MORPHINE SULFATE (PF) 2 MG/ML IV SOLN
INTRAVENOUS | Status: AC
  Filled 2023-07-11: qty 1

## 2023-07-11 MED ORDER — ONDANSETRON HCL 4 MG/2ML IJ SOLN
4.0000 mg | Freq: Four times a day (QID) | INTRAMUSCULAR | Status: DC | PRN
  Administered 2023-07-12: 4 mg via INTRAVENOUS
  Filled 2023-07-11: qty 2

## 2023-07-11 MED ORDER — SODIUM CHLORIDE 0.9% FLUSH
3.0000 mL | Freq: Two times a day (BID) | INTRAVENOUS | Status: DC
  Administered 2023-07-11 – 2023-07-12 (×2): 3 mL via INTRAVENOUS

## 2023-07-11 MED ORDER — MIDAZOLAM HCL 2 MG/2ML IJ SOLN
INTRAMUSCULAR | Status: AC
  Filled 2023-07-11: qty 2

## 2023-07-11 MED ORDER — LABETALOL HCL 5 MG/ML IV SOLN: 10.0000 mg | INTRAVENOUS | Status: AC | PRN

## 2023-07-11 MED ORDER — ASPIRIN 81 MG PO CHEW
81.0000 mg | CHEWABLE_TABLET | Freq: Every day | ORAL | Status: DC
  Administered 2023-07-12: 81 mg via ORAL
  Filled 2023-07-11: qty 1

## 2023-07-11 MED ORDER — CANGRELOR TETRASODIUM 50 MG IV SOLR
INTRAVENOUS | Status: AC
  Filled 2023-07-11: qty 50

## 2023-07-11 MED ORDER — INSULIN ASPART 100 UNIT/ML IJ SOLN
0.0000 [IU] | Freq: Three times a day (TID) | INTRAMUSCULAR | Status: DC
  Administered 2023-07-11: 3 [IU] via SUBCUTANEOUS
  Administered 2023-07-12: 5 [IU] via SUBCUTANEOUS
  Administered 2023-07-12: 2 [IU] via SUBCUTANEOUS
  Administered 2023-07-12: 5 [IU] via SUBCUTANEOUS

## 2023-07-11 MED ORDER — MORPHINE SULFATE (PF) 4 MG/ML IV SOLN
4.0000 mg | Freq: Once | INTRAVENOUS | Status: AC
  Administered 2023-07-11: 4 mg via INTRAVENOUS
  Filled 2023-07-11: qty 1

## 2023-07-11 MED ORDER — HEPARIN SODIUM (PORCINE) 1000 UNIT/ML IJ SOLN
INTRAMUSCULAR | Status: AC
  Filled 2023-07-11: qty 10

## 2023-07-11 MED ORDER — LIDOCAINE HCL (PF) 1 % IJ SOLN
INTRAMUSCULAR | Status: DC | PRN
  Administered 2023-07-11: 2 mL

## 2023-07-11 MED ORDER — CANGRELOR BOLUS VIA INFUSION
INTRAVENOUS | Status: DC | PRN
  Administered 2023-07-11: 2586 ug via INTRAVENOUS

## 2023-07-11 MED ORDER — FENTANYL CITRATE (PF) 100 MCG/2ML IJ SOLN
INTRAMUSCULAR | Status: AC
  Filled 2023-07-11: qty 2

## 2023-07-11 MED ORDER — ACETAMINOPHEN 325 MG PO TABS: 650.0000 mg | ORAL_TABLET | ORAL | Status: DC | PRN

## 2023-07-11 MED ORDER — VERAPAMIL HCL 2.5 MG/ML IV SOLN
INTRAVENOUS | Status: DC | PRN
  Administered 2023-07-11 (×3): 100 ug via INTRACORONARY

## 2023-07-11 MED ORDER — SODIUM CHLORIDE 0.9 % IV SOLN: 250.0000 mL | INTRAVENOUS | Status: DC | PRN

## 2023-07-11 MED ORDER — SODIUM CHLORIDE 0.9 % IV SOLN
INTRAVENOUS | Status: DC | PRN
  Administered 2023-07-11: 4 ug/kg/min via INTRAVENOUS

## 2023-07-11 MED ORDER — SODIUM CHLORIDE 0.9% FLUSH: 3.0000 mL | INTRAVENOUS | Status: DC | PRN

## 2023-07-11 MED ORDER — VERAPAMIL HCL 2.5 MG/ML IV SOLN
INTRAVENOUS | Status: AC
  Filled 2023-07-11: qty 2

## 2023-07-11 MED ORDER — VERAPAMIL HCL 2.5 MG/ML IV SOLN
INTRAVENOUS | Status: DC | PRN
  Administered 2023-07-11: 10 mL via INTRA_ARTERIAL

## 2023-07-11 MED ORDER — SODIUM CHLORIDE 0.9 % IV SOLN
INTRAVENOUS | Status: DC | PRN
  Administered 2023-07-11: 250 mL via INTRAVENOUS

## 2023-07-11 MED ORDER — NITROGLYCERIN 0.4 MG SL SUBL
0.4000 mg | SUBLINGUAL_TABLET | SUBLINGUAL | Status: DC | PRN
  Administered 2023-07-12: 0.4 mg via SUBLINGUAL
  Filled 2023-07-11: qty 1

## 2023-07-11 MED ORDER — MIDAZOLAM HCL 2 MG/2ML IJ SOLN
INTRAMUSCULAR | Status: DC | PRN
  Administered 2023-07-11 (×2): 1 mg via INTRAVENOUS

## 2023-07-11 MED ORDER — IOHEXOL 350 MG/ML SOLN
INTRAVENOUS | Status: DC | PRN
  Administered 2023-07-11: 220 mL

## 2023-07-11 SURGICAL SUPPLY — 28 items
BALL SAPPHIRE NC24 3.5X8 (BALLOONS) ×1
BALLN EMERGE MR 2.5X20 (BALLOONS) ×1
BALLN ~~LOC~~ EMERGE MR 3.25X15 (BALLOONS) ×1
BALLOON EMERGE MR 2.5X20 (BALLOONS) IMPLANT
BALLOON SAPPHIRE NC24 3.5X8 (BALLOONS) IMPLANT
BALLOON ~~LOC~~ EMERGE MR 3.25X15 (BALLOONS) IMPLANT
CANISTER PENUMBRA ENGINE (MISCELLANEOUS) IMPLANT
CATH 5FR JL3.5 JR4 ANG PIG MP (CATHETERS) IMPLANT
CATH EXPO 5F MPA-1 (CATHETERS) IMPLANT
CATH INDIGO CAT RX KIT (CATHETERS) IMPLANT
CATH INFINITI 5 FR AR1 MOD (CATHETERS) IMPLANT
CATH INFINITI 5 FR LCB (CATHETERS) IMPLANT
CATH INFINITI AMBI 5FR TG (CATHETERS) IMPLANT
CATH LAUNCHER 6FR AL1 (CATHETERS) IMPLANT
CATHETER LAUNCHER 6FR AL1 (CATHETERS) ×1
DEVICE RAD COMP TR BAND LRG (VASCULAR PRODUCTS) IMPLANT
GLIDESHEATH SLEND SS 6F .021 (SHEATH) IMPLANT
GUIDEWIRE INQWIRE 1.5J.035X260 (WIRE) IMPLANT
INQWIRE 1.5J .035X260CM (WIRE) ×1
KIT ENCORE 26 ADVANTAGE (KITS) IMPLANT
KIT HEART LEFT (KITS) ×2 IMPLANT
PACK CARDIAC CATHETERIZATION (CUSTOM PROCEDURE TRAY) ×2 IMPLANT
SHEATH PROBE COVER 6X72 (BAG) IMPLANT
STENT SYNERGY XD 3.0X38 (Permanent Stent) IMPLANT
SYNERGY XD 3.0X38 (Permanent Stent) ×1 IMPLANT
TRANSDUCER W/STOPCOCK (MISCELLANEOUS) ×2 IMPLANT
TUBING CIL FLEX 10 FLL-RA (TUBING) ×2 IMPLANT
WIRE ASAHI PROWATER 180CM (WIRE) IMPLANT

## 2023-07-11 NOTE — Brief Op Note (Addendum)
BRIEF CARDIAC CATH - PCI NOTE  Adrian Foley   [id number] 07/11/2023 1:56 PM  PRIMARY CARE PROVIDER: [name], MD CARDIOLOGIST:  None - Dr. Duke Salvia   PROCEDURE:  Procedure(s): LEFT HEART CATH AND CORS/GRAFTS ANGIOGRAPHY (N/A) Coronary/Graft Acute MI Revascularization (N/A) Coronary Thrombectomy (N/A)  SURGEON:  Surgeons and Role:    * Marykay Lex, MD - Primary  PATIENT:  Adrian Foley  72 y.o. male with history of CABG x 4 in 2017 presenting with inferior STEMI.  Brought directly to cardiac x-rays and lab from ER.  PRE-OPERATIVE DIAGNOSIS: Inferior (posterior) stemi  POST-OPERATIVE DIAGNOSIS:  Severe native CAD with essentially occluded RCA, LCx and LAD with extensive stents in the LAD that are occluded. CULPRIT LESION: 100% thombotic occlusion of SVG-RPL => ~borderline successful PCI of SVG after extensive angioplasty, penumbra aspiration thrombectomy and stent placement in the hospital proximal lesion with a 3.0 mm x 38 mm Synergy XD stent with the proximal half postdilated to 3.4 mm in the ostium postdilated to 3.6 mm. Patent free LIMA to LAD which supposedly was sewn onto the hood of SVG-OM which is not visible.  SVG-RI also appears to be occluded. EF appears to be relatively preserved with inferior hypokinesis.  EF 45%.  LVEDP 24 mmHg.  PROCEDURE PERFORMED Time Out: Verified patient identification, verified procedure, site/side was marked, verified correct patient position, special equipment/implants available, medications/allergies/relevent history reviewed, required imaging and test results available. Performed.  Access:  RIGHT Radial Artery: 6 Fr sheath -- Seldinger technique using Micropuncture Kit -- Direct ultrasound guidance used.  Permanent image obtained and placed on chart. -- 10 mL radial cocktail IA; 5000 Units IV Heparin (4000 Units given in ER)  Left Heart Catheterization: 5&6Fr Catheters advanced or exchanged over a J-wire under direct  fluoroscopic guidance into the ascending aorta; TIG 4.0 catheter advanced first.  * LV Hemodynamics (LV Gram): Angled Pigtail Catheter * Left Coronary Artery Cineangiography: JL3.5 Catheter  * Right Coronary Artery (SVG-RI  & Free LIMA-LAD)  Cineangiography: JR4 Catheter ; an LCB catheter was used to reevaluate. * SVG-RPL Cineangiography: 6Fr AL-1 Guide Catheter   Review of initial angiography revealed: Likely culprit being occluded SVG-RPL, but also likely CTO/flush occlusion SVG-RI and SVG-OM with widely patent LIMA-LAD  Preparations are made for PCI of SVG-RPL - IV Cagreal Bolus & Infusion given (as pt received Morphine in ER & later on cath table) - 180 mg PO Brilinta given  PCI Performed: AL1 Guide Catheter -> Prowater wire; 2.5 x 20 balloon - multiple inflations followed by Penumbra Thrombectomy -> still no flow, IC NTG given & finally ostial PTCA followed by restoration of flow. => Synergy DES 3.0 x 38 mm => deployed 14 Atm x 30 min -- prox 1/2 post-dilated with 3.25 mm Kersey balloon & ostial segment (@ focal 90% lesion) post-dialted with 3.5 x 8 [place]ree[place] balloon to 3.6 mm) -- IC Verapamil 300 mcg given -- final result TIMI ~2 flow with retrograde flow to RPL2 - but unable to restore antegrade flow to RPL1.  => guide catheter exchanged for additional catheters for additional Graft angiography & LV Gram.  Upon completion of Angiogaphy, the catheter was removed completely out of the body over a wire, without complication.  Radial sheath removed in the Cardiac Catheterization lab with TR Band placed for hemostasis.  TR Band: 1325  Hours; 13 mL air; Rev Barbeau C  MEDICATIONS SQ Lidocaine 4 mL Radial Cocktail: 3 mg Verapmil in 10 mL NS Heparin:  5,000 Units IC NTG 200  mcg x 2 IC Verapim 100 mcg x 3 Brilinta 180 mg PO Cangreal Bolus & infusion (to run 2 hr after PCI)   EBL:  < 50 mL   BLOOD ADMINISTERED:none  DRAINS: none   LOCAL MEDICATIONS USED:  LIDOCAINE   PATIENT DISPOSITION:   PACU - hemodynamically stable.  PLAN OF CARE: Admit to inpatient  - se H&P for plan   DICTATION: .Note written in EPIC   Delay start of Pharmacological VTE agent (>24hrs) due to surgical blood loss or risk of bleeding: not applicable    [name], MD

## 2023-07-11 NOTE — ED Triage Notes (Signed)
Pt c/o generalized cp x 1 hour, heaviness, with associated sob;  hx quadruple bypass 6 years ago; denies N/V; took 2 nitroglycerin at home without relief

## 2023-07-11 NOTE — H&P (Signed)
Cardiology Admission History and Physical   Patient ID: Adrian Foley MRN: [id number]; DOB: 11-Aug-1951   Admission date: 07/11/2023  PCP:  [name], MD   [facility] Providers Cardiologist:  None        Chief Complaint: Chest pain-inferior STEMI  Patient Profile:   Adrian Foley is a 72 y.o. male with PMH notable for DM-2, hyperlipidemia, HTN and CAD-CABG x 4 (free LIMA-LAD sewn to SVG-OM, SVG-RI, SVG-RPL) in 2017 who is being seen 07/11/2023 for the evaluation of inferior STEMI.  History of Present Illness:   Adrian Foley had an episode of chest comfort about a month ago went to see his PCP.  Evaluated with a stress test that was reportedly negative.   He did relatively well after that, but unfortunately presented to [facility], ER via POV this morning (07/11/2023) after roughly 1 hour of crushing substernal chest pain.  In the ER he was found to have inferior ST elevations.  Code STEMI was called he was brought directly to cardiac catheterization lab for evaluation.  He was having ~10/10 CP with dyspnea, but no PND or orthopnea leading to this hospitalization.  No rapid irregular heartbeats or palpitations. He did have some nausea with chest pain but no emesis.  Past Medical History:  Diagnosis Date   CAD (coronary artery disease)    CABG 2017   Diabetes mellitus without complication (HCC)    type 2 --metformin stopped 04/2016 due to taste side effect   Kidney stones    Mixed hyperlipidemia    NSTEMI (non-ST elevated myocardial infarction) (HCC)    Seizures (HCC)    post-traumatic (pediatric) initially, then was medicated for a few years, then was weened off med and has been seizure-free since.    Past Surgical History:  Procedure Laterality Date   CARDIAC CATHETERIZATION N/A 12/23/2015   Procedure: Left Heart Cath and Coronary Angiography;  Surgeon: Marykay Lex, MD;  Location: [facility] INVASIVE CV LAB;  Service: Cardiovascular;  Laterality: N/A;  any available  cath attending   CORONARY ANGIOPLASTY WITH STENT PLACEMENT     approx age 9 per pt report   CORONARY ARTERY BYPASS GRAFT N/A 12/29/2015   Procedure: CORONARY ARTERY BYPASS GRAFTING (CABG) x four,  using left internal mammary artery and right leg greater saphenous vein harvested endoscopically;  Surgeon: [name], MD;  Location: [facility] OR;  Service: Open Heart Surgery;  Laterality: N/A;   INGUINAL HERNIA REPAIR Right 07/19/2018   Procedure: RIGHT HERNIA REPAIR INCARCERATED INGUINAL ADULT;  Surgeon: Andria Meuse, MD;  Location: [facility] OR;  Service: General;  Laterality: Right;   INSERTION OF MESH Right 07/19/2018   Procedure: INSERTION OF MESH;  Surgeon: Andria Meuse, MD;  Location: [facility] OR;  Service: General;  Laterality: Right;   TEE WITHOUT CARDIOVERSION N/A 12/29/2015   Procedure: TRANSESOPHAGEAL ECHOCARDIOGRAM (TEE);  Surgeon: [name], MD;  Location: [facility] OR;  Service: Open Heart Surgery;  Laterality: N/A;     Medications Prior to Admission: Prior to Admission medications   Medication Sig Start Date End Date Taking? Authorizing Provider  aspirin 81 MG tablet Take 81 mg by mouth daily.    [provider]  nitroGLYCERIN (NITROSTAT) 0.4 MG SL tablet Place 1 tablet (0.4 mg total) under the tongue every 5 (five) minutes as needed for chest pain. Patient not taking: Reported on 06/08/2023 02/28/23   [name], MD  rosuvastatin (CRESTOR) 5 MG tablet 1 tab po three days  a week Patient not taking: Reported on 06/08/2023 03/04/23   [name], MD     Allergies:    Allergies  Allergen Reactions   Atorvastatin Other (See Comments)    myalgias   Simvastatin Other (See Comments)    Myalgias/arthralgias    Social History:   Social History   Socioeconomic History   Marital status: Married    Spouse name: Not on file   Number of children: Not on file   Years of education: Not on file   Highest education level: Not on file  Occupational History   Not on  file  Tobacco Use   Smoking status: Former   Smokeless tobacco: Former    Types: Chew    Quit date: 05/23/2015  Substance and Sexual Activity   Alcohol use: No   Drug use: No   Sexual activity: Not on file  Other Topics Concern   Not on file  Social History Narrative   Married, 3 children, 6 grandkids.   Lives in [place].   Occup: carpentry   Tob: 30 pack-yr hx, quit 2009.  Chewed tobacco x "years"-quit around age 13.   Alc: none   Social Determinants of Health   Financial Resource Strain: Low Risk  (06/08/2023)   Overall Financial Resource Strain (CARDIA)    Difficulty of Paying Living Expenses: Not hard at all  Food Insecurity: No Food Insecurity (06/08/2023)   Hunger Vital Sign    Worried About Running Out of Food in the Last Year: Never true    Ran Out of Food in the Last Year: Never true  Transportation Needs: No Transportation Needs (06/08/2023)   PRAPARE - Administrator, Civil Service (Medical): No    Lack of Transportation (Non-Medical): No  Physical Activity: Inactive (06/08/2023)   Exercise Vital Sign    Days of Exercise per Week: 0 days    Minutes of Exercise per Session: 0 min  Stress: No Stress Concern Present (06/08/2023)   [employer] of Occupational Health - Occupational Stress Questionnaire    Feeling of Stress : Not at all  Social Connections: Moderately Isolated (06/08/2023)   Social Connection and Isolation Panel [NHANES]    Frequency of Communication with Friends and Family: More than three times a week    Frequency of Social Gatherings with Friends and Family: More than three times a week    Attends Religious Services: Never    Database administrator or Organizations: No    Attends Banker Meetings: Never    Marital Status: Married  Catering manager Violence: Not At Risk (06/08/2023)   Humiliation, Afraid, Rape, and Kick questionnaire    Fear of Current or Ex-Partner: No    Emotionally Abused: No    Physically Abused: No     Sexually Abused: No    Family History:   The patient's family history includes Alcohol abuse in his father; Arthritis in his sister; Depression in his sister; Early death in his father, mother, and sister; Heart disease in his maternal aunt and maternal uncle.    ROS:  Please see the history of present illness.  -> Detailed review of symptoms not performed as this was an emergency H&P, and he was sedated postprocedure  All other ROS reviewed and negative.     Physical Exam/Data:   Vitals:   07/11/23 1325 07/11/23 1330 07/11/23 1335 07/11/23 1340  BP: 108/69 118/69 106/69 106/69  Pulse: (!) 46 (!) 58 (!) 51 (!) 0  Resp: 19 17 16    Temp:      TempSrc:      SpO2:      Weight:      Height:       No intake or output data in the 24 hours ending 07/11/23 1344    07/11/2023   11:15 AM 06/08/2023   11:35 AM 05/27/2023    9:00 AM  Last 3 Weights  Weight (lbs) 190 lb 190 lb 190 lb  Weight (kg) 86.183 kg 86.183 kg 86.183 kg     Body mass index is 28.06 kg/m.  General:  Well nourished, well developed, in severe acute distress-10/10 chest pain HEENT: normal Neck: unable to assess JVD as he was lying down and moving, but no carotid bruit noted. Vascular: No carotid bruits; Distal pulses 2+ bilaterally   Cardiac: Distant heart sounds; normal S1, S2; RRR; no murmur, rubs or gallops Lungs:  clear to auscultation bilaterally, no wheezing, rales or rhonchi  Abd: soft, nontender, no hepatomegaly  Ext: no clubbing cyanosis or edema Musculoskeletal:  No deformities, BUE and BLE strength normal and equal Skin: warm and dry  Neuro:  CNs 2-12 intact, no focal abnormalities noted Psych:  Normal affect    EKG:  The ECG that was done Upon arrival in the ER showed sinus bradycardia-rate 47 bpm.  Inferior-(possible posterior) ST elevations with Q's in III and borderline Q in aVF.  ST elevation noted in III, aVF.  Depressions noted in I, aVL, V2 and subtle depressions in V1. personally  reviewed  Relevant CV Studies: Myoview March 04, 2023-low risk scan.  No ischemia or infarction.  Normal EF of 60%.  No RWMA.  TTE 12/21/2015: EF 55 to 60%.  Apical inferior septal hypokinesis.  GR 1 DD.  Trivial MR and TR. Cardiac cath 12/23/2015 => severe MV CAD-referred for CABG x 4 Ost RCA to Dist RCA lesion, 100% stenosed. Dist RCA lesion, 99% stenosed. Ost RPDA lesion, 100% stenosed. Post Atrio lesion, 90% stenosed. Prox LAD to Mid LAD lesion, 95% stenosed. The lesion was previously treated with a bare metal stent greater than two years ago. Severe IN-Stent Restenosis Dist LAD lesion, 100% stenosed beond D2. 2nd Diag lesion, 70% stenosed. 2nd Mrg lesion, 65% stenosed. The left ventricular systolic function is normal.  Laboratory Data:  High Sensitivity Troponin:   Recent Labs  Lab 07/11/23 1120 07/11/23 1158  TROPONINIHS 34* 35*      Chemistry Recent Labs  Lab 07/11/23 1120 07/11/23 1154 07/11/23 1158  NA 138 140 139  K 4.3 3.7 3.6  CL 103 107 106  CO2 22  --  18*  GLUCOSE 241* 270* 259*  BUN 18 19 17   CREATININE 1.19 1.00 1.10  CALCIUM 9.5  --  9.2  GFRNONAA >60  --  >60  ANIONGAP 13  --  15    Recent Labs  Lab 07/11/23 1120 07/11/23 1158  PROT 7.2 7.2  ALBUMIN 4.1 4.0  AST 22 20  ALT 22 24  ALKPHOS 72 70  BILITOT 1.0 1.0   Lipids  Recent Labs  Lab 07/11/23 1120  CHOL 246*  TRIG 542*  HDL 32*  LDLCALC UNABLE TO CALCULATE IF TRIGLYCERIDE OVER 400 mg/dL  CHOLHDL 7.7   Hematology Recent Labs  Lab 07/11/23 1120 07/11/23 1154 07/11/23 1158  WBC 8.4  --  11.7*  RBC 5.69  --  5.40  HGB 17.5* 16.3 16.9  HCT 51.7 48.0 47.0  MCV 90.9  --  87.0  MCH  30.8  --  31.3  MCHC 33.8  --  36.0  RDW 12.6  --  12.5  PLT 224  --  245   Thyroid No results for input(s): "TSH", "FREET4" in the last 168 hours. BNPNo results for input(s): "BNP", "PROBNP" in the last 168 hours.  DDimer No results for input(s): "DDIMER" in the last 168 hours.   Radiology/Studies:   No results found.   Assessment and Plan:   Inferior STEMIPrincipal Problem:   ST elevation myocardial infarction (STEMI) of inferior wall, initial episode of care [facility]) Active Problems:   Coronary artery disease involving coronary bypass graft of native heart with angina pectoris (HCC)   Coronary artery disease involving native coronary artery of native heart with unstable angina pectoris (HCC)   Dyslipidemia associated with type 2 diabetes mellitus (HCC)   Type 2 diabetes mellitus with vascular disease (HCC)   Hx of CABG  Patient presented roughly 1 hour after onset of chest pain found to have Inferior STEMI.  Likely culprit lesion was SVG to RPL with extensive thrombus treated with balloon angioplasty extensively and penumbra aspiration thrombectomy followed by DES PCI of the ostial proximal segment restoring TIMI II flow from TIMI 0 flow.  (Several boluses of IC NTG and IC verapamil administered) => also noted likely occluded SVG-RI and SVG-OM but with patent free LIMA-LAD. Admit to ICU for ongoing care will run 2 hours of Kengreal followed by long-term DAPT. Check 2D echo along with lipid panel and A1c. Will use sliding scale insulin for now and increase rosuvastatin home dose up to 40 mg. BP is relatively therefore we will hold off on initiating beta-blocker or ARB until we see has pressures normalizing out.   Risk Assessment/Risk Scores:    TIMI Risk Score for ST  Elevation MI:   The patient's TIMI risk score is 5, which indicates a 12.4% risk of all cause mortality at 30 days.   New York Heart Association (NYHA) Functional Class NYHA Class II    Code Status: Full Code  Severity of Illness: The appropriate patient status for this patient is INPATIENT. Inpatient status is judged to be reasonable and necessary in order to provide the required intensity of service to ensure the patient's safety. The patient's presenting symptoms, physical exam findings, and initial radiographic  and laboratory data in the context of their chronic comorbidities is felt to place them at high risk for further clinical deterioration. Furthermore, it is not anticipated that the patient will be medically stable for discharge from the hospital within 2 midnights of admission.   * I certify that at the point of admission it is my clinical judgment that the patient will require inpatient hospital care spanning beyond 2 midnights from the point of admission due to high intensity of service, high risk for further deterioration and high frequency of surveillance required.*   For questions or updates, please contact [facility] Please consult [email or web address] for contact info under     Signed, [name], MD  07/11/2023 1:44 PM

## 2023-07-11 NOTE — Progress Notes (Signed)
   07/11/23 1200  Spiritual Encounters  Type of Visit Initial  Care provided to: [facility] partners present during encounter Nurse  Referral source Code page  Reason for visit Code  OnCall Visit No   Ch responded to code STEMI. Family is at [facility] waiting area. Pt was in route to CT. Pt provide hospitality and emotional support. No family present at this time.

## 2023-07-11 NOTE — OR Nursing (Signed)
Attempted to give report. 2H staff advised report can not be taken now. Will attempt again soon.

## 2023-07-11 NOTE — ED Provider Notes (Signed)
Emergency Department Provider Note   I have reviewed the triage vital signs and the nursing notes.   HISTORY  Chief Complaint Chest Pain   HPI Adrian Foley is a 72 y.o. male CAD, DM, HLD, and CABG 7 years prior presents to the emergency department with acute onset chest pressure.  He developed symptoms around 90 minutes prior to arrival.  He drove to the emergency department.  He took 2 nitroglycerin with no relief.  No diaphoresis or vomiting but describes severe pain and shortness of breath.  He is compliant with his medications.  Notes that this feels very similar to his prior ACS.   Past Medical History:  Diagnosis Date   CAD (coronary artery disease)    CABG 2017   Diabetes mellitus without complication (HCC)    type 2 --metformin stopped 04/2016 due to taste side effect   Kidney stones    Mixed hyperlipidemia    NSTEMI (non-ST elevated myocardial infarction) (HCC)    Seizures (HCC)    post-traumatic (pediatric) initially, then was medicated for a few years, then was weened off med and has been seizure-free since.    Review of Systems  Constitutional: No fever/chills Cardiovascular: Positive chest pain. Respiratory: Denies shortness of breath. Gastrointestinal: No abdominal pain.  No nausea, no vomiting.   Genitourinary: Negative for dysuria. Musculoskeletal: Negative for back pain. Skin: Negative for rash. Neurological: Negative for headaches.   ____________________________________________   PHYSICAL EXAM:  VITAL SIGNS: ED Triage Vitals  Encounter Vitals Group     BP 07/11/23 1111 135/63     Pulse Rate 07/11/23 1111 (!) 50     Resp 07/11/23 1111 (!) 22     Temp 07/11/23 1111 97.6 F (36.4 C)     Temp Source 07/11/23 1111 Oral     SpO2 07/11/23 1111 100 %     Weight 07/11/23 1115 190 lb (86.2 kg)     Height 07/11/23 1115 5\' 9"  (1.753 m)   Constitutional: Alert and oriented.  Patient appears unwell.  Clutching his chest and groaning in pain.  Eyes:  Conjunctivae are normal. Head: Atraumatic. Nose: No congestion/rhinnorhea. Mouth/Throat: Mucous membranes are moist.  Neck: No stridor.   Cardiovascular: Normal rate, regular rhythm. Good peripheral circulation. Grossly normal heart sounds.   Respiratory: Normal respiratory effort.  No retractions. Lungs CTAB. Gastrointestinal: Soft and nontender. No distention.  Musculoskeletal: No gross deformities of extremities. Neurologic:  Normal speech and language.  Skin:  Skin is warm, dry and intact. No rash noted.   ____________________________________________   LABS (all labs ordered are listed, but only abnormal results are displayed)  Labs Reviewed  HEMOGLOBIN A1C  CBC WITH DIFFERENTIAL/PLATELET  PROTIME-INR  APTT  COMPREHENSIVE METABOLIC PANEL  LIPID PANEL  I-STAT CG4 LACTIC ACID, ED  TROPONIN I (HIGH SENSITIVITY)   ____________________________________________  EKG   EKG Interpretation Date/Time:  Monday July 11 2023 11:15:06 EDT Ventricular Rate:  47 PR Interval:  156 QRS Duration:  96 QT Interval:  450 QTC Calculation: 398 R Axis:   31  Text Interpretation: ** Critical Test Result: STEMI Sinus bradycardia Inferior-posterior infarct , possibly acute ** ** ACUTE MI / STEMI ** ** Consider right ventricular involvement in acute inferior infarct Abnormal ECG When compared with ECG of 19-Jul-2018 17:40, PREVIOUS ECG IS PRESENT Confirmed by Alona Bene [phone]) on 07/11/2023 11:19:22 AM        ____________________________________________   PROCEDURES  Procedure(s) performed:   Procedures  CRITICAL CARE Performed by: Maia Plan  Total critical care time: 30 minutes Critical care time was exclusive of separately billable procedures and treating other patients. Critical care was necessary to treat or prevent imminent or life-threatening deterioration. Critical care was time spent personally by me on the following activities: development of treatment plan with  patient and/or surrogate as well as nursing, discussions with consultants, evaluation of patient's response to treatment, examination of patient, obtaining history from patient or surrogate, ordering and performing treatments and interventions, ordering and review of laboratory studies, ordering and review of radiographic studies, pulse oximetry and re-evaluation of patient's condition.  Alona Bene, MD Emergency Medicine  ____________________________________________   INITIAL IMPRESSION / ASSESSMENT AND PLAN / ED COURSE  Pertinent labs & imaging results that were available during my care of the patient were reviewed by me and considered in my medical decision making (see chart for details).   This patient is Presenting for Evaluation of CP, which does require a range of treatment options, and is a complaint that involves a high risk of morbidity and mortality.  The Differential Diagnoses includes but is not exclusive to acute coronary syndrome, aortic dissection, pulmonary embolism, cardiac tamponade, community-acquired pneumonia, pericarditis, musculoskeletal chest wall pain, etc.   Critical Interventions-    Medications  0.9 %  sodium chloride infusion (has no administration in time range)  ondansetron (ZOFRAN) injection 4 mg (has no administration in time range)  aspirin chewable tablet 324 mg (324 mg Oral Given 07/11/23 1125)  heparin injection 4,000 Units (4,000 Units Intravenous Given 07/11/23 1124)  morphine (PF) 4 MG/ML injection 4 mg (4 mg Intravenous Given 07/11/23 1128)    Reassessment after intervention: pain slightly improved with morphine.   Clinical Laboratory Tests: Patient taken emergently to cath lab prior to labs resulting.   Radiologic Tests: Patient taken emergently to cath lab.   Cardiac Monitor Tracing which shows NSR.    Social Determinants of Health Risk patient is not an active smoker.   Consult complete with Cardiology. Will accept the patient emergently  to the cath lab.   Medical Decision Making: Summary:  Patient arrives to the emergency department by private vehicle with acute onset chest pain.  He appears an extremis on arrival.  I was notified by triage staff that his EKG was abnormal and appreciated inferior ST elevation MI.  Patient was immediately roomed and code STEMI activated.  Patient given morphine for pain as he has tried nitroglycerin with no relief.   Reevaluation with update and discussion with patient.  Cardiology has called our charge nurse advising that patient be brought immediately to the Cath Lab.   [name] presentation is most consistent with acute presentation with potential threat to life or bodily function.   Disposition: admit  ____________________________________________  FINAL CLINICAL IMPRESSION(S) / ED DIAGNOSES  Final diagnoses:  ST elevation myocardial infarction (STEMI), unspecified artery (HCC)    Note:  This document was prepared using Dragon voice recognition software and may include unintentional dictation errors.  Alona Bene, MD, [facility] Emergency Medicine    Heath Badon, Arlyss Repress, MD 07/11/23 [phone]

## 2023-07-12 ENCOUNTER — Inpatient Hospital Stay (HOSPITAL_COMMUNITY): Payer: Medicare Other

## 2023-07-12 ENCOUNTER — Other Ambulatory Visit (HOSPITAL_COMMUNITY): Payer: Self-pay

## 2023-07-12 ENCOUNTER — Telehealth: Payer: Self-pay | Admitting: Cardiology

## 2023-07-12 ENCOUNTER — Encounter (HOSPITAL_COMMUNITY): Payer: Self-pay | Admitting: Cardiology

## 2023-07-12 DIAGNOSIS — I2119 ST elevation (STEMI) myocardial infarction involving other coronary artery of inferior wall: Secondary | ICD-10-CM

## 2023-07-12 LAB — LIPID PANEL
Cholesterol: 196 mg/dL (ref 0–200)
HDL: 27 mg/dL — ABNORMAL LOW (ref >40)
LDL Cholesterol: UNDETERMINED mg/dL (ref 0–99)
Total CHOL/HDL Ratio: 7.3 RATIO
Triglycerides: 433 mg/dL — ABNORMAL HIGH (ref <150)
VLDL: UNDETERMINED mg/dL (ref 0–40)

## 2023-07-12 LAB — ECHOCARDIOGRAM COMPLETE
AR max vel: 2.76 cm2
AV Peak grad: 4.3 mmHg
Ao pk vel: 1.04 m/s
Area-P 1/2: 4.8 cm2
Height: 69 in
MV M vel: 1.9 m/s
MV Peak grad: 14.4 mmHg
S' Lateral: 3 cm
Weight: 3040 oz

## 2023-07-12 LAB — BASIC METABOLIC PANEL
Anion gap: 10 (ref 5–15)
BUN: 15 mg/dL (ref 8–23)
CO2: 25 mmol/L (ref 22–32)
Calcium: 9.1 mg/dL (ref 8.9–10.3)
Chloride: 102 mmol/L (ref 98–111)
Creatinine, Ser: 1.19 mg/dL (ref 0.61–1.24)
GFR, Estimated: >60 mL/min (ref >60)
Glucose, Bld: 216 mg/dL — ABNORMAL HIGH (ref 70–99)
Potassium: 4.7 mmol/L (ref 3.5–5.1)
Sodium: 137 mmol/L (ref 135–145)

## 2023-07-12 LAB — GLUCOSE, CAPILLARY
Glucose-Capillary: 215 mg/dL — ABNORMAL HIGH (ref 70–99)
Glucose-Capillary: 230 mg/dL — ABNORMAL HIGH (ref 70–99)
Glucose-Capillary: 142 mg/dL — ABNORMAL HIGH (ref 70–99)

## 2023-07-12 LAB — LDL CHOLESTEROL, DIRECT: Direct LDL: 92 mg/dL (ref 0–99)

## 2023-07-12 LAB — CBC
HCT: 46.8 % (ref 39.0–52.0)
Hemoglobin: 15.6 g/dL (ref 13.0–17.0)
MCH: 30.6 pg (ref 26.0–34.0)
MCHC: 33.3 g/dL (ref 30.0–36.0)
MCV: 91.9 fL (ref 80.0–100.0)
Platelets: 196 10*3/uL (ref 150–400)
RBC: 5.09 MIL/uL (ref 4.22–5.81)
RDW: 13.1 % (ref 11.5–15.5)
WBC: 9.8 10*3/uL (ref 4.0–10.5)
nRBC: 0 % (ref 0.0–0.2)

## 2023-07-12 MED ORDER — EMPAGLIFLOZIN 10 MG PO TABS
10.0000 mg | ORAL_TABLET | Freq: Every day | ORAL | Status: DC
  Administered 2023-07-12: 10 mg via ORAL
  Filled 2023-07-12: qty 1

## 2023-07-12 MED ORDER — EMPAGLIFLOZIN 10 MG PO TABS: 10.0000 mg | ORAL_TABLET | Freq: Every day | ORAL | 3 refills | Status: DC

## 2023-07-12 MED ORDER — PRASUGREL HCL 10 MG PO TABS: ORAL_TABLET | ORAL | 3 refills | Status: AC

## 2023-07-12 MED ORDER — ROSUVASTATIN CALCIUM 40 MG PO TABS: 40.0000 mg | ORAL_TABLET | Freq: Every day | ORAL | 3 refills | Status: DC

## 2023-07-12 MED ORDER — CHLORHEXIDINE GLUCONATE CLOTH 2 % EX PADS
6.0000 | MEDICATED_PAD | Freq: Every day | CUTANEOUS | Status: DC
  Administered 2023-07-12: 6 via TOPICAL

## 2023-07-12 MED ORDER — PRASUGREL HCL 10 MG PO TABS
60.0000 mg | ORAL_TABLET | Freq: Once | ORAL | Status: DC
  Filled 2023-07-12: qty 6

## 2023-07-12 MED ORDER — PRASUGREL HCL 10 MG PO TABS: 10.0000 mg | ORAL_TABLET | Freq: Every day | ORAL | Status: DC

## 2023-07-12 NOTE — Discharge Summary (Cosign Needed)
Discharge Summary    Patient ID: Adrian Foley MRN: [id number]; DOB: 1951-09-25  Admit date: 07/11/2023 Discharge date: 07/12/2023  PCP:  [name], MD   [facility] Providers Cardiologist:  Chilton Si, MD        Discharge Diagnoses    Principal Problem:   ST elevation myocardial infarction (STEMI) of inferior wall, initial episode of care [facility]) Active Problems:   Dyslipidemia associated with type 2 diabetes mellitus (HCC)   Hx of CABG   Type 2 diabetes mellitus with vascular disease (HCC)   Coronary artery disease involving coronary bypass graft of native heart with angina pectoris (HCC)   Coronary artery disease involving native coronary artery of native heart with unstable angina pectoris [facility])    Diagnostic Studies/Procedures    07/11/23 LHC with PCI    -------------SEVERE OCCLUSIVE 3 VESSEL NATIVE CAD---------------------------   Prox RCA to Dist RCA lesion is 100% stenosed with 100% stenosed side branch in RPAV.   Prox LAD to Mid LAD lesion is 70% stenosed - ISR   Mid Cx to Dist Cx lesion is 99% stenosed with 90% stenosed side branch in 2nd Mrg.  2nd Mrg lesion is 100% stenosed.   ---------------GRAFTS----------------------------   SVG-1st Daig graft was visualized by angiography and WAS normal in caliber. Vessel is normal; Origin to Prox Graft lesion is 100% stenosed.   SVG-OM1 prox Graft lesion is 100% stenosed. - >  Graft is not visualized, but the free LIMA-LAD comes from this hood.  LIMA-LAD graft was visualized by angiography angiographically normal, but slow flow down the LAD.   -------------------CULPRIT LESION-PCI---------------------------------   SVG-RPL graft was visualized by angiography and is normal in caliber. Origin lesion is 90% stenosed. Prox Graft lesion is 100% stenosed.  (After restoring flow: Insertion lesion at RPL is 70% stenosed. 2nd RPL lesion is 90% stenosed - obstructive antegrade flow ; unable to cross with wire)   A  drug-eluting stent was successfully placed in the Ostial/Prox SVG, using a SYNERGY XD 3.0X38.  Post intervention, there is a 0% residual stenosis throughout most of the stent, but ~20 % ISR at the ostial lesion.   -----------------HEMODYNAMICS--------------------------------------   There is mild to moderate left ventricular systolic dysfunction.  The left ventricular ejection fraction is 35-45% by visual estimate.   LV end diastolic pressure is normal.         POST-OPERATIVE DIAGNOSIS:  Severe native CAD with essentially occluded RCA, LCx and LAD with extensive stents in the LAD that are occluded. CULPRIT LESION: 100% thombotic occlusion of SVG-RPL  ~Successful PCI of SVG after extensive angioplasty, penumbra aspiration thrombectomy and stent placement in the hospital proximal lesion with a 3.0 mm x 38 mm Synergy XD stent with the proximal half postdilated to 3.4 mm in the ostium postdilated to 3.6 mm. Patent Free LIMA to LAD which supposedly was sewn onto the hood of SVG-OM which is not visible.  SVG-RI also appears to be occluded. EF appears to be relatively preserved with inferior hypokinesis.  EF 45%.  LVEDP 24 mmHg.     RECOMMENDATIONS Admit to ICU for ongoing care will run 2 hours of Kengreal followed by long-term DAPT. Check 2D echo along with lipid panel and A1c. Will use sliding scale insulin for now and increase rosuvastatin home dose up to 40 mg. BP is relatively low & HR in 40-50 range, therefore we will hold off on initiating beta-blocker or ARB until we see has pressures normalizing out.. Consider Coronary CTA to  visualize other 2 remaining SVGs; SVG-Diag appears to be occluded, & SVG-OM was not visualized despite a patent Free LIMA sewn to the hood. Following Year 1 - would continue Maintenance dose Brilinta 60 mg BID or clopidogrel 75 mg daily to complete Year 2    Diagnostic Dominance: Right  Intervention    04/12/23 TTE  IMPRESSIONS     1. Left ventricular  ejection fraction, by estimation, is 55 to 60%. The  left ventricle has normal function. The left ventricle demonstrates  regional wall motion abnormalities (see scoring diagram/findings for  description). Diastolic function is  indeterminate due to marked bradycardia.   2. Right ventricular systolic function is normal. The right ventricular  size is normal. Tricuspid regurgitation signal is inadequate for assessing  PA pressure.   3. Left atrial size was mildly dilated.   4. The mitral valve is normal in structure. Trivial mitral valve  regurgitation. No evidence of mitral stenosis.   5. The aortic valve is tricuspid. Aortic valve regurgitation is not  visualized. No aortic stenosis is present.   6. There is borderline dilatation of the ascending aorta, measuring 39  mm.   Comparison(s): Prior images reviewed side by side.   FINDINGS   Left Ventricle: Left ventricular ejection fraction, by estimation, is 55  to 60%. The left ventricle has normal function. The left ventricle  demonstrates regional wall motion abnormalities. The left ventricular  internal cavity size was normal in size.  There is no left ventricular hypertrophy. Diastolic function is  indeterminate due to marked bradycardia.     LV Wall Scoring:  The posterior wall and basal inferior segment are hypokinetic.   Right Ventricle: The right ventricular size is normal. No increase in  right ventricular wall thickness. Right ventricular systolic function is  normal. Tricuspid regurgitation signal is inadequate for assessing PA  pressure.   Left Atrium: Left atrial size was mildly dilated.   Right Atrium: Right atrial size was normal in size.   Pericardium: There is no evidence of pericardial effusion.   Mitral Valve: The mitral valve is normal in structure. Trivial mitral  valve regurgitation. No evidence of mitral valve stenosis.   Tricuspid Valve: The tricuspid valve is normal in structure. Tricuspid  valve  regurgitation is trivial.   Aortic Valve: The aortic valve is tricuspid. Aortic valve regurgitation is  not visualized. No aortic stenosis is present. Aortic valve peak gradient  measures 4.3 mmHg.   Pulmonic Valve: The pulmonic valve was normal in structure. Pulmonic valve  regurgitation is not visualized. No evidence of pulmonic stenosis.   Aorta: The aortic root is normal in size and structure. There is  borderline dilatation of the ascending aorta, measuring 39 mm.   Venous: The inferior vena cava was not well visualized.   IAS/Shunts: No atrial level shunt detected by color flow Doppler.  _____________   History of Present Illness     Adrian Foley is a 72 y.o. male with PMH notable for DM-2, hyperlipidemia, HTN and CAD-CABG x 4 (free LIMA-LAD sewn to SVG-OM, SVG-RI, SVG-RPL) in 2017, seen for evaluation in setting of inferior STEMI.  Hospital Course     Consultants: n/a   CAD s/p CABG x4 Inferior STEMI Hyperlipidemia  Mr. Koller had an episode of chest comfort about a month ago and went to see his PCP.  Evaluated with a stress test that was reportedly negative.   He did relatively well after that, but unfortunately presented to [facility], ER on the  morning of 07/11/2023 after roughly 1 hour of crushing substernal chest pain.  In the ER he was found to have inferior ST elevations.  Code STEMI was called he was brought directly to cardiac catheterization lab for evaluation. He with~10/10 CP with dyspnea, but no PND or orthopnea leading to this hospitalization.  No rapid irregular heartbeats or palpitations. He did have some nausea with chest pain but no emesis.  Patient underwent LHC as above with full report as above with Dr. Herbie Baltimore. Culprit lesion found to be 100% thrombotic occlusion of SVG-RPL. Successful PCI of SVG after extensive angioplasty, penumbra aspiration thrombectomy and stent placement in the hospital proximal lesion with a 3.0 mm x 38 mm Synergy XD stent with the  proximal half postdilated to 3.4 mm in the ostium postdilated to 3.6 mm. He was admitted to the ICU for 2 hours of Cangrelor and was then started on Brilinta. TTE on day of discharge with LVEF 55-60%, posterior wall and basal inferior segment hypokinetic.   At discharge, patient will go home on the following medications: Brilinta switched to Effient due to cost. 60mg  load 12 hours following last Brilinta dose and then 10mg  daily. Continue for at least 6 months. ASA 81mg  Crestor 40mg . Patient previously intolerant of Atorvastatin and Simvastatin but tolerating 40mg  Crestor inpatient. Continue this high intensity dose at discharge. If intolerant, would need referral to lipid clinic for PCSK9 inhibitor. PRN nitroglycerin 0.4mg .  No beta blocker with bradycardia No ARNI at d/c due to soft BP per Dr. Anne Fu. Consider in outpatient.    Diabetes Mellitus type II  A1c 6.9 as of 02/28/23. Patient not currently on anti-hyperglycemic regimen. Will start Jardiance 10mg .   Did the patient have an acute coronary syndrome (MI, NSTEMI, STEMI, etc) this admission?:  Yes                               AHA/ACC Clinical Performance & Quality Measures: Aspirin prescribed? - Yes ADP Receptor Inhibitor (Plavix/Clopidogrel, Brilinta/Ticagrelor or Effient/Prasugrel) prescribed (includes medically managed patients)? - Yes Beta Blocker prescribed? - No - Bradycardia High Intensity Statin (Lipitor 40-80mg  or Crestor 20-40mg ) prescribed? - Yes EF assessed during THIS hospitalization? - Yes For EF <40%, was ACEI/ARB prescribed? - Not Applicable (EF >/= 40%) For EF <40%, Aldosterone Antagonist (Spironolactone or Eplerenone) prescribed? - Not Applicable (EF >/= 40%) Cardiac Rehab Phase II ordered (including medically managed patients)? - Yes       The patient will be scheduled for a TOC follow up appointment in 14 days.  A message has been sent to the [facility] and Scheduling Pool at the office where the patient should be  seen for follow up.  _____________  Discharge Vitals Blood pressure 98/87, pulse (!) 54, temperature 98.6 F (37 C), temperature source Oral, resp. rate (!) 23, height 5\' 9"  (1.753 m), weight 86.2 kg, SpO2 93%.  Filed Weights   07/11/23 1115  Weight: 86.2 kg    Labs & Radiologic Studies    CBC Recent Labs    07/11/23 1120 07/11/23 1154 07/11/23 1158 07/11/23 1528 07/12/23 0818  WBC 8.4  --  11.7* 10.8* 9.8  NEUTROABS 6.1  --  9.0*  --   --   HGB 17.5*   < > 16.9 17.3* 15.6  HCT 51.7   < > 47.0 49.3 46.8  MCV 90.9  --  87.0 89.2 91.9  PLT 224  --  245 178 196   < > =  values in this interval not displayed.   Basic Metabolic Panel Recent Labs    16/10/96 1158 07/11/23 1528 07/12/23 0818  NA 139  --  137  K 3.6  --  4.7  CL 106  --  102  CO2 18*  --  25  GLUCOSE 259*  --  216*  BUN 17  --  15  CREATININE 1.10 1.14 1.19  CALCIUM 9.2  --  9.1   Liver Function Tests Recent Labs    07/11/23 1120 07/11/23 1158  AST 22 20  ALT 22 24  ALKPHOS 72 70  BILITOT 1.0 1.0  PROT 7.2 7.2  ALBUMIN 4.1 4.0   No results for input(s): "LIPASE", "AMYLASE" in the last 72 hours. High Sensitivity Troponin:   Recent Labs  Lab 07/11/23 1120 07/11/23 1158 07/11/23 1456  TROPONINIHS 34* 35* 354*    BNP Invalid input(s): "POCBNP" D-Dimer No results for input(s): "DDIMER" in the last 72 hours. Hemoglobin A1C Recent Labs    07/11/23 1158  HGBA1C 8.4*   Fasting Lipid Panel Recent Labs    07/12/23 0818  CHOL 196  HDL 27*  LDLCALC UNABLE TO CALCULATE IF TRIGLYCERIDE OVER 400 mg/dL  TRIG 045*  CHOLHDL 7.3  LDLDIRECT 92   Thyroid Function Tests No results for input(s): "TSH", "T4TOTAL", "T3FREE", "THYROIDAB" in the last 72 hours.  Invalid input(s): "FREET3" _____________  ECHOCARDIOGRAM COMPLETE  Result Date: 07/12/2023    ECHOCARDIOGRAM REPORT   Patient Name:   Adrian Foley Date of Exam: 07/12/2023 Medical Rec #:  [id number]     Height:       69.0 in Accession #:     [id number]    Weight:       190.0 lb Date of Birth:  12-Dec-1951      BSA:          2.021 m Patient Age:    71 years      BP:           103/59 mmHg Patient Gender: M             HR:           42 bpm. Exam Location:  Inpatient Procedure: 2D Echo, Cardiac Doppler and Color Doppler Indications:    Acute myocardial infarction, unspecified I21.9,                 CAD Native Vessel I25.10  History:        Patient has prior history of Echocardiogram examinations, most                 recent 12/21/2015. CAD, Angina and Acute MI, Prior CABG; Risk                 Factors:Diabetes and Dyslipidemia.  Sonographer:    [name] Referring Phys: 21 DAVID W HARDING IMPRESSIONS  1. Left ventricular ejection fraction, by estimation, is 55 to 60%. The left ventricle has normal function. The left ventricle demonstrates regional wall motion abnormalities (see scoring diagram/findings for description). Diastolic function is indeterminate due to marked bradycardia.  2. Right ventricular systolic function is normal. The right ventricular size is normal. Tricuspid regurgitation signal is inadequate for assessing PA pressure.  3. Left atrial size was mildly dilated.  4. The mitral valve is normal in structure. Trivial mitral valve regurgitation. No evidence of mitral stenosis.  5. The aortic valve is tricuspid. Aortic valve regurgitation is not visualized. No aortic stenosis is present.  6. There  is borderline dilatation of the ascending aorta, measuring 39 mm. Comparison(s): Prior images reviewed side by side. FINDINGS  Left Ventricle: Left ventricular ejection fraction, by estimation, is 55 to 60%. The left ventricle has normal function. The left ventricle demonstrates regional wall motion abnormalities. The left ventricular internal cavity size was normal in size. There is no left ventricular hypertrophy. Diastolic function is indeterminate due to marked bradycardia.  LV Wall Scoring: The posterior wall and basal inferior segment are  hypokinetic. Right Ventricle: The right ventricular size is normal. No increase in right ventricular wall thickness. Right ventricular systolic function is normal. Tricuspid regurgitation signal is inadequate for assessing PA pressure. Left Atrium: Left atrial size was mildly dilated. Right Atrium: Right atrial size was normal in size. Pericardium: There is no evidence of pericardial effusion. Mitral Valve: The mitral valve is normal in structure. Trivial mitral valve regurgitation. No evidence of mitral valve stenosis. Tricuspid Valve: The tricuspid valve is normal in structure. Tricuspid valve regurgitation is trivial. Aortic Valve: The aortic valve is tricuspid. Aortic valve regurgitation is not visualized. No aortic stenosis is present. Aortic valve peak gradient measures 4.3 mmHg. Pulmonic Valve: The pulmonic valve was normal in structure. Pulmonic valve regurgitation is not visualized. No evidence of pulmonic stenosis. Aorta: The aortic root is normal in size and structure. There is borderline dilatation of the ascending aorta, measuring 39 mm. Venous: The inferior vena cava was not well visualized. IAS/Shunts: No atrial level shunt detected by color flow Doppler.  LEFT VENTRICLE PLAX 2D LVIDd:         4.20 cm   Diastology LVIDs:         3.00 cm   LV e' medial:    6.83 cm/s LV PW:         1.20 cm   LV E/e' medial:  13.3 LV IVS:        0.90 cm   LV e' lateral:   9.55 cm/s LVOT diam:     2.10 cm   LV E/e' lateral: 9.5 LV SV:         61 LV SV Index:   30 LVOT Area:     3.46 cm  RIGHT VENTRICLE RV S prime:     8.27 cm/s TAPSE (M-mode): 1.3 cm LEFT ATRIUM             Index        RIGHT ATRIUM           Index LA diam:        3.90 cm 1.93 cm/m   RA Area:     15.80 cm LA Vol (A2C):   48.8 ml 24.14 ml/m  RA Volume:   31.70 ml  15.68 ml/m LA Vol (A4C):   45.6 ml 22.56 ml/m LA Biplane Vol: 49.7 ml 24.59 ml/m  AORTIC VALVE                 PULMONIC VALVE AV Area (Vmax): 2.76 cm     PR End Diast Vel: 5.90 msec AV  Vmax:        104.00 cm/s AV Peak Grad:   4.3 mmHg LVOT Vmax:      82.83 cm/s LVOT Vmean:     49.800 cm/s LVOT VTI:       0.175 m  AORTA Ao Root diam: 3.60 cm Ao Asc diam:  3.90 cm MITRAL VALVE MV Area (PHT): 4.80 cm    SHUNTS MV Decel Time: 158 msec    Systemic VTI:  0.18 m MR Peak grad: 14.4 mmHg    Systemic Diam: 2.10 cm MR Vmax:      190.00 cm/s MV E velocity: 90.60 cm/s MV A velocity: 52.00 cm/s MV E/A ratio:  1.74 Mihai Croitoru MD Electronically signed by Thurmon Fair MD Signature Date/Time: 07/12/2023/12:36:44 PM    Final    CARDIAC CATHETERIZATION  Result Date: 07/11/2023   -------------SEVERE OCCLUSIVE 3 VESSEL NATIVE CAD---------------------------   Prox RCA to Dist RCA lesion is 100% stenosed with 100% stenosed side branch in RPAV.   Prox LAD to Mid LAD lesion is 70% stenosed - ISR   Mid Cx to Dist Cx lesion is 99% stenosed with 90% stenosed side branch in 2nd Mrg.  2nd Mrg lesion is 100% stenosed.   ---------------GRAFTS----------------------------   SVG-1st Daig graft was visualized by angiography and WAS normal in caliber. Vessel is normal; Origin to Prox Graft lesion is 100% stenosed.   SVG-OM1 prox Graft lesion is 100% stenosed. - >  Graft is not visualized, but the free LIMA-LAD comes from this hood.  LIMA-LAD graft was visualized by angiography angiographically normal, but slow flow down the LAD.   -------------------CULPRIT LESION-PCI---------------------------------   SVG-RPL graft was visualized by angiography and is normal in caliber. Origin lesion is 90% stenosed. Prox Graft lesion is 100% stenosed.  (After restoring flow: Insertion lesion at RPL is 70% stenosed. 2nd RPL lesion is 90% stenosed - obstructive antegrade flow ; unable to cross with wire)   A drug-eluting stent was successfully placed in the Ostial/Prox SVG, using a SYNERGY XD 3.0X38.  Post intervention, there is a 0% residual stenosis throughout most of the stent, but ~20 % ISR at the ostial lesion.    -----------------HEMODYNAMICS--------------------------------------   There is mild to moderate left ventricular systolic dysfunction.  The left ventricular ejection fraction is 35-45% by visual estimate.   LV end diastolic pressure is normal. POST-OPERATIVE DIAGNOSIS: Severe native CAD with essentially occluded RCA, LCx and LAD with extensive stents in the LAD that are occluded. CULPRIT LESION: 100% thombotic occlusion of SVG-RPL ~Successful PCI of SVG after extensive angioplasty, penumbra aspiration thrombectomy and stent placement in the hospital proximal lesion with a 3.0 mm x 38 mm Synergy XD stent with the proximal half postdilated to 3.4 mm in the ostium postdilated to 3.6 mm. Patent Free LIMA to LAD which supposedly was sewn onto the hood of SVG-OM which is not visible.  SVG-RI also appears to be occluded. EF appears to be relatively preserved with inferior hypokinesis.  EF 45%.  LVEDP 24 mmHg. RECOMMENDATIONS Admit to ICU for ongoing care will run 2 hours of Kengreal followed by long-term DAPT. Check 2D echo along with lipid panel and A1c. Will use sliding scale insulin for now and increase rosuvastatin home dose up to 40 mg. BP is relatively low & HR in 40-50 range, therefore we will hold off on initiating beta-blocker or ARB until we see has pressures normalizing out.. Consider Coronary CTA to visualize other 2 remaining SVGs; SVG-Diag appears to be occluded, & SVG-OM was not visualized despite a patent Free LIMA sewn to the hood. Following Year 1 - would continue Maintenance dose Brilinta 60 mg BID or clopidogrel 75 mg daily to complete Year 2 [name], MD   DG Chest Port 1 View  Result Date: 07/11/2023 CLINICAL DATA:  Chest pain EXAM: PORTABLE CHEST 1 VIEW COMPARISON:  Chest x-ray 02/04/2016 FINDINGS: Cardiomediastinal silhouette is within normal limits. There is new diffuse central pulmonary vascular congestion. No focal lung  infiltrate, pleural effusion or pneumothorax. Sternotomy wires are  present. No fractures are seen. IMPRESSION: New diffuse central pulmonary vascular congestion. Electronically Signed   By: Darliss Cheney M.D.   On: 07/11/2023 15:33   Disposition   Pt is being discharged home today in good condition.  Follow-up Plans & Appointments     Discharge Instructions     AMB Referral to Cardiac Rehabilitation - Phase II   Complete by: As directed    Diagnosis:  STEMI Coronary Stents     After initial evaluation and assessments completed: Virtual Based Care may be provided alone or in conjunction with Phase 2 Cardiac Rehab based on patient barriers.: Yes   Intensive Cardiac Rehabilitation (ICR) [facility] location only OR Traditional Cardiac Rehabilitation (TCR) *If criteria for ICR are not met will enroll in TCR [facility] only): Yes        Discharge Medications   Allergies as of 07/12/2023       Reactions   Atorvastatin Other (See Comments)   myalgias   Simvastatin Other (See Comments)   Myalgias/arthralgias        Medication List     TAKE these medications    aspirin 81 MG tablet Take 81 mg by mouth daily.   empagliflozin 10 MG Tabs tablet Commonly known as: JARDIANCE Take 1 tablet (10 mg total) by mouth daily. Start taking on: July 13, 2023   nitroGLYCERIN 0.4 MG SL tablet Commonly known as: NITROSTAT Place 1 tablet (0.4 mg total) under the tongue every 5 (five) minutes as needed for chest pain.   prasugrel 10 MG Tabs tablet Commonly known as: EFFIENT Take 6 tablets (60 mg total) by mouth as directed for 1 day, THEN 1 tablet (10 mg total) daily. Start taking on: July 12, 2023 Notes to patient: Your first dose should be taken tonight 07/12/23 at 9:30pm. Take six 10mg  tablets. Starting 07/13/23, you should take 10mg  nightly.   rosuvastatin 40 MG tablet Commonly known as: CRESTOR Take 1 tablet (40 mg total) by mouth daily. Start taking on: July 13, 2023 What changed:  medication strength how much to take how to take this when to take  this additional instructions           Outstanding Labs/Studies     Duration of Discharge Encounter   Greater than 30 minutes including physician time.  Con Memos, PA-C 07/12/2023, 5:37 PM   Personally seen and examined. Agree with above.  Doing well, ambulating well.  No further chest discomfort.  No adverse arrhythmias on telemetry.  Echocardiogram-normal ejection fraction  Cardiac catheterization 07/11/2023-Dr. Herbie Baltimore -Thrombotic occlusion SVG to posterior lateral branch.  PCI, thrombectomy, 3.0 x 38 mm Synergy stent. -Free LIMA to LAD which was sewn into the hood of SVG to OM which was not visible. - SVG to ramus intermediate appears to be occluded -EF 45%, LVEDP 24 mmHg -Dual antiplatelet therapy for at least 6 months.     Physical Exam .     GEN: No acute distress.   Neck: No JVD Cardiac: brady reg, no murmurs, rubs, or gallops.  Cath site clean dry and intact Respiratory: Clear to auscultation bilaterally. GI: Soft, nontender, non-distended  MS: No edema   Assessment & Plan .      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 - Dual antiplatelet therapy for at least 6 months.  Will switch to Effient due to cost. Age is 64. No  prior CVA. Discussed with pharm.  -Troponin peak 354 -Cardiac rehab --With bradycardia avoiding Bb. BP soft, no ARNI. May be able to start as outpatient.      Hyperlipidemia with statin intolerance - Previously had trouble with atorvastatin, simvastatin, and has not been taking his low-dose Crestor. -Currently has been placed on rosuvastatin 40 mg daily.  He may need visit with our lipid clinic for PCSK9 inhibitor, Repatha for instance. - LDL 113 triglycerides 542, hemoglobin A1c 6.9-diabetic   Diabetes mellitus type 2 - Hemoglobin A1c 6.9 on 02/28/2023. - Wanted to start Jardiance 10 mg once a day.  Patient and wife a bit skeptical about  medications.  We will go ahead and try.  Comfortable with discharge, stable postmyocardial infarction/PCI.  Normal ejection fraction.  Close follow-up.  Cardiac rehabilitation will contact him.  Knows to contact us if any worrisome symptoms develop.  Donato Schultz, MD

## 2023-07-12 NOTE — Progress Notes (Signed)
Echocardiogram 2D Echocardiogram has been performed.  [name] 07/12/2023, 10:28 AM

## 2023-07-12 NOTE — TOC CM/SW Note (Signed)
Transition of Care [facility]) - Inpatient Brief Assessment   Patient Details  Name: Adrian Foley MRN: [id number] Date of Birth: 03/11/1951  Transition of Care [facility]) CM/SW Contact:    Gala Lewandowsky, RN Phone Number: 07/12/2023, 3:32 PM   Clinical Narrative: Patient presented for chest pain-Stemi. Post LHC. Benefits check submitted for Effient cost. Case Manager will continue to follow for additional transition of care needs.    Transition of Care Asessment: Insurance and Status: Insurance coverage has been reviewed Patient has primary care physician: Yes  Prior/Current Home Services: No current home services Social Determinants of Health Reivew: SDOH reviewed no interventions necessary Readmission risk has been reviewed: Yes Transition of care needs: transition of care needs identified, TOC will continue to follow

## 2023-07-12 NOTE — Inpatient Diabetes Management (Addendum)
Inpatient Diabetes Program Recommendations  AACE/ADA: New Consensus Statement on Inpatient Glycemic Control   Target Ranges:  Prepandial:   less than 140 mg/dL      Peak postprandial:   less than 180 mg/dL (1-2 hours)      Critically ill patients:  140 - 180 mg/dL    Latest Reference Range & Units 07/11/23 16:47 07/11/23 21:43 07/12/23 06:48  Glucose-Capillary 70 - 99 mg/dL 161 (H) 096 (H) 045 (H)    Latest Reference Range & Units 02/28/23 10:31 07/11/23 11:20 07/11/23 11:58  Hemoglobin A1C 4.8 - 5.6 % 6.9 ! 6.9 8.4 (H) 8.4 (H)   Review of Glycemic Control  Diabetes history: DM2 Outpatient Diabetes medications: None Current orders for Inpatient glycemic control: Novolog 0-15 units TID with meals, Jardiance 10 mg daily  Inpatient Diabetes Program Recommendations:    HbgA1C:  A1C 8.4% on 07/11/23 indicating an average glucose of 194 mg/dl over the past 2-3 months. Please prescribe oral DM medication at discharge.   Outpatient DM: Would recommend ordering oral DM medication at time of discharge and have patient follow up with PCP.  Addendum 07/12/23@12 :00-Spoke with patient at bedside. Patient reports that he does not take any DM medications outpatient and he checks his glucose every now and then. Patient reports he checked CBG a couple of days ago and it was in the 190's mg/dl. Inquired about Metformin noted in older notes in chart. Patient states he took 2 or 3 doses and stopped the medication. Discussed A1C results (8.4% on 7/22/4) and explained that current A1C indicates an average glucose of 194 mg/dl over the past 2-3 months. Discussed glucose and A1C goals. Discussed importance of checking CBGs and maintaining good CBG control to prevent long-term and short-term complications. Stressed to the patient the importance of improving glycemic control to prevent further complications from uncontrolled diabetes. Informed patient that [name] was started today. Discussed Jardiance and how it  works. Explained that he will need to start taking DM medication outpatient and he may be prescribed Jardiance at discharge. Patient does not see PCP very often. Asked that he start checking glucose 1-2 times a day and be sure to follow up with PCP at least every 3-6 months.  Patient verbalized understanding of information discussed and reports no further questions at this time related to diabetes.  Thanks, Orlando Penner, RN, MSN, CDCES Diabetes Coordinator Inpatient Diabetes Program [phone] (Team Pager from 8am to 5pm)

## 2023-07-12 NOTE — Progress Notes (Addendum)
1139 Pt was educated on stent card, stent location, Antiplatelet and ASA use, wt restrictions, no baths/daily wash-ups, s/s of infection,  NTG use and calling 911, heart healthy diet, risk factors and CRPII. Pt received MI book materials on exercise, diet, and CRPII. Will refer to [facility].  Education incomplete, pt appeared overwhelmed. Pt asked if he can have bypass surgery again.   Ambulation deferred d/t low BP and pt feeling dizzy after moving to chair. I'll come back after he eats lunch and assess.   1245 Pt sleeping, will f/u  Faustino Congress MS, ACSM-CEP 07/12/2023

## 2023-07-12 NOTE — Telephone Encounter (Signed)
   Transition of Care Follow-up Phone Call Request    Patient Name: Adrian Foley Date of Birth: [birth year] Date of Encounter: 07/12/2023  Primary Care Provider:  [name], MD Primary Cardiologist:  Chilton Si, MD  [name] has been scheduled for a transition of care follow up appointment with a [facility] provider:  Gillian Shields, NP on 8/1 @ 2:45 for STEMI follow up.   Please reach out to [name] within 48 hours to confirm appointment and review transition of care protocol questionnaire.  Perlie Gold, PA-C  07/12/2023, 5:36 PM

## 2023-07-12 NOTE — Progress Notes (Signed)
Feeling well. Would like to be discharged. EF on ECHO normal.  Meds reviewed. Effient.   Close follow up.  Tele: no ectopy. Sinus brady - 50's. No bb.   OK for DC.   Donato Schultz, MD

## 2023-07-12 NOTE — Progress Notes (Signed)
   Patient Name: Adrian Foley Date of Encounter: 07/12/2023 [facility] Cardiologist: Chilton Si, MD   Interval Summary  .    Doing well.  No chest pain. Last night some brief residual chest pain. Wife at bedside. Carpenter. Skepitcal about medications.   Prior to admission 10/10 chest pain with shortness of breath, crushing.  1 hour duration.  Inferior ST elevation.  Vital Signs .    Vitals:   07/12/23 0600 07/12/23 0615 07/12/23 0800 07/12/23 0830  BP: (!) 103/59  97/65   Pulse: (!) 46 (!) 48 (!) 44   Resp: 13 13 16    Temp:    98.5 F (36.9 C)  TempSrc:    Oral  SpO2: 95% 93% 95%   Weight:      Height:        Intake/Output Summary (Last 24 hours) at 07/12/2023 0929 Last data filed at 07/12/2023 0630 Gross per 24 hour  Intake 795.28 ml  Output 725 ml  Net 70.28 ml      07/11/2023   11:15 AM 06/08/2023   11:35 AM 05/27/2023    9:00 AM  Last 3 Weights  Weight (lbs) 190 lb 190 lb 190 lb  Weight (kg) 86.183 kg 86.183 kg 86.183 kg      Telemetry/ECG    Cardiac catheterization 07/11/2023-Dr. Herbie Baltimore -Thrombotic occlusion SVG to posterior lateral branch.  PCI, thrombectomy, 3.0 x 38 mm Synergy stent. -Free LIMA to LAD which was sewn into the hood of SVG to OM which was not visible. - SVG to ramus intermediate appears to be occluded -EF 45%, LVEDP 24 mmHg -Dual antiplatelet therapy for at least 6 months.  Sinus brady 40's, brief 8 beats NSVT- Personally Reviewed  Physical Exam .    GEN: No acute distress.   Neck: No JVD Cardiac: brady reg, no murmurs, rubs, or gallops.  Cath site clean dry and intact Respiratory: Clear to auscultation bilaterally. GI: Soft, nontender, non-distended  MS: No edema  Assessment & Plan .     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 - Dual antiplatelet therapy for at least 6 months.  Will switch to Effient due to cost. Age is 89. No prior CVA.  Discussed with pharm.  -Troponin peak 354 -Cardiac rehab --With bradycardia avoiding Bb. BP soft, no ARNI. May be able to start as outpatient.    Hyperlipidemia with statin intolerance - Previously had trouble with atorvastatin, simvastatin, and has not been taking his low-dose Crestor. -Currently has been placed on rosuvastatin 40 mg daily.  He may need visit with our lipid clinic for PCSK9 inhibitor, Repatha for instance. - LDL 113 triglycerides 542, hemoglobin A1c 6.9-diabetic  Diabetes mellitus type 2 - Hemoglobin A1c 6.9 on 02/28/2023. -Will start Jardiance 10 mg once a day. May not be cost effective.   ECHO getting done now. May be able to DC later today if stable.     For questions or updates, please contact [facility] Please consult [email or web address] for contact info under        Signed, Donato Schultz, MD

## 2023-07-13 ENCOUNTER — Telehealth: Payer: Self-pay

## 2023-07-13 ENCOUNTER — Other Ambulatory Visit (HOSPITAL_COMMUNITY): Payer: Self-pay

## 2023-07-13 DIAGNOSIS — I2119 ST elevation (STEMI) myocardial infarction involving other coronary artery of inferior wall: Secondary | ICD-10-CM

## 2023-07-13 DIAGNOSIS — Z955 Presence of coronary angioplasty implant and graft: Secondary | ICD-10-CM

## 2023-07-13 LAB — LIPOPROTEIN A (LPA): Lipoprotein (a): 26.3 nmol/L (ref <75.0)

## 2023-07-13 NOTE — Telephone Encounter (Signed)
Left message to call back  

## 2023-07-13 NOTE — Transitions of Care (Post Inpatient/ED Visit) (Unsigned)
   07/13/2023  Name: WILKINS ELPERS MRN: [id number] DOB: 02-03-1951  Today's TOC FU Call Status: Today's TOC FU Call Status:: Unsuccessul Call (1st Attempt) Unsuccessful Call (1st Attempt) Date: 07/13/23  Attempted to reach the patient regarding the most recent Inpatient/ED visit.  Follow Up Plan: Additional outreach attempts will be made to reach the patient to complete the Transitions of Care (Post Inpatient/ED visit) call.   Signature   Woodfin Ganja LPN [facility] Nurse Health Advisor Direct Dial [phone]

## 2023-07-14 NOTE — Telephone Encounter (Signed)
2nd call attempt, patient contacted for [facility] call. Appointment information reviewed with patient, appointment confirmed.

## 2023-07-14 NOTE — Transitions of Care (Post Inpatient/ED Visit) (Signed)
   07/14/2023  Name: Adrian Foley MRN: [id number] DOB: [birth year]  Today's TOC FU Call Status: Today's TOC FU Call Status:: Successful TOC FU Call Competed Unsuccessful Call (1st Attempt) Date: 07/13/23 [facility] FU Call Complete Date: 07/14/23  Transition Care Management Follow-up Telephone Call Date of Discharge: 07/12/23 Discharge Facility: [facility]) Type of Discharge: Inpatient Admission Primary Inpatient Discharge Diagnosis:: ST elevation myocardial infarction (STEMI) of inferior wall How have you been since you were released from the hospital?: Better Any questions or concerns?: No  Items Reviewed: Did you receive and understand the discharge instructions provided?: Yes Medications obtained,verified, and reconciled?: Yes (Medications Reviewed) Any new allergies since your discharge?: No Dietary orders reviewed?: Yes Do you have support at home?: Yes  Medications Reviewed Today: Medications Reviewed Today     Reviewed by Merleen Nicely, LPN (Licensed Practical Nurse) on 07/14/23 at 1025  Med List Status: <None>   Medication Order Taking? Sig Documenting Provider Last Dose Status Informant  aspirin 81 MG tablet [id number] Yes Take 81 mg by mouth daily. [provider] Taking Active Spouse/Significant Other  empagliflozin (JARDIANCE) 10 MG TABS tablet [id number] Yes Take 1 tablet (10 mg total) by mouth daily. Perlie Gold, PA-C Taking Active   nitroGLYCERIN (NITROSTAT) 0.4 MG SL tablet [id number] Yes Place 1 tablet (0.4 mg total) under the tongue every 5 (five) minutes as needed for chest pain. McGowen, Maryjean Morn, MD Taking Active Spouse/Significant Other  prasugrel (EFFIENT) 10 MG TABS tablet [id number] Yes Take 6 tablets (60 mg total) by mouth as directed for 1 day, THEN 1 tablet (10 mg total) daily. Perlie Gold, PA-C Taking Active   rosuvastatin (CRESTOR) 40 MG tablet [id number] Yes Take 1 tablet (40 mg total) by mouth daily. Perlie Gold, PA-C Taking Active              Home Care and Equipment/Supplies: Were Home Health Services Ordered?: No Any new equipment or medical supplies ordered?: No  Functional Questionnaire: Do you need assistance with bathing/showering or dressing?: No Do you need assistance with meal preparation?: No Do you need assistance with eating?: No Do you have difficulty maintaining continence: No Do you need assistance with getting out of bed/getting out of a chair/moving?: No Do you have difficulty managing or taking your medications?: No  Follow up appointments reviewed: PCP Follow-up appointment confirmed?: No MD Provider Line Number:[phone] Given: Yes Specialist Hospital Follow-up appointment confirmed?: Yes Date of Specialist follow-up appointment?: 07/21/23 Follow-Up Specialty Provider:: Dr Dan Humphreys Do you need transportation to your follow-up appointment?: No Do you understand care options if your condition(s) worsen?: Yes-patient verbalized understanding    SIGNATURE  Woodfin Ganja LPN [facility] Nurse Health Advisor Direct Dial [phone]

## 2023-07-18 ENCOUNTER — Telehealth (HOSPITAL_COMMUNITY): Payer: Self-pay

## 2023-07-18 NOTE — Telephone Encounter (Signed)
Pt wants to go to [facility] for cardiac rehab, faxed referral to [facility]. Closed referral.

## 2023-07-21 ENCOUNTER — Ambulatory Visit (HOSPITAL_BASED_OUTPATIENT_CLINIC_OR_DEPARTMENT_OTHER): Payer: Medicare Other | Admitting: Family

## 2023-07-21 ENCOUNTER — Encounter (HOSPITAL_BASED_OUTPATIENT_CLINIC_OR_DEPARTMENT_OTHER): Payer: Self-pay | Admitting: Family

## 2023-07-21 VITALS — BP 112/72 | HR 49 | Ht 69.0 in | Wt 192.1 lb

## 2023-07-21 DIAGNOSIS — I252 Old myocardial infarction: Secondary | ICD-10-CM | POA: Diagnosis not present

## 2023-07-21 DIAGNOSIS — I2510 Atherosclerotic heart disease of native coronary artery without angina pectoris: Secondary | ICD-10-CM

## 2023-07-21 DIAGNOSIS — Z9861 Coronary angioplasty status: Secondary | ICD-10-CM | POA: Diagnosis not present

## 2023-07-21 DIAGNOSIS — E785 Hyperlipidemia, unspecified: Secondary | ICD-10-CM | POA: Diagnosis not present

## 2023-07-21 DIAGNOSIS — I2119 ST elevation (STEMI) myocardial infarction involving other coronary artery of inferior wall: Secondary | ICD-10-CM

## 2023-07-21 MED ORDER — EMPAGLIFLOZIN 10 MG PO TABS: 10.0000 mg | ORAL_TABLET | Freq: Every day | ORAL | 0 refills | Status: DC

## 2023-07-21 MED ORDER — EMPAGLIFLOZIN 10 MG PO TABS: 10.0000 mg | ORAL_TABLET | Freq: Every day | ORAL | Status: DC

## 2023-07-21 NOTE — Patient Instructions (Signed)
Medication Instructions:  Your physician recommends that you continue on your current medications as directed. Please refer to the Current Medication list given to you today.  *If you need a refill on your cardiac medications before your next appointment, please call your pharmacy*   Lab Work: Your physician recommends that you return for lab work in 6 weeks for Fasting Lipid Panel and CMP   If you have labs (blood work) drawn today and your tests are completely normal, you will receive your results only by: MyChart Message (if you have MyChart) OR A paper copy in the mail If you have any lab test that is abnormal or we need to change your treatment, we will call you to review the results.  Follow-Up: At [facility], you and your health needs are our priority.  As part of our continuing mission to provide you with exceptional heart care, we have created designated Provider Care Teams.  These Care Teams include your primary Cardiologist (physician) and Advanced Practice Providers (APPs -  Physician Assistants and Nurse Practitioners) who all work together to provide you with the care you need, when you need it.  We recommend signing up for the patient portal called "MyChart".  Sign up information is provided on this After Visit Summary.  MyChart is used to connect with patients for Virtual Visits (Telemedicine).  Patients are able to view lab/test results, encounter notes, upcoming appointments, etc.  Non-urgent messages can be sent to your provider as well.   To learn more about what you can do with MyChart, go to [email or web address].    Your next appointment:   3 months with Dr. Duke Salvia or Gillian Shields, NP

## 2023-07-21 NOTE — Progress Notes (Signed)
Cardiology Office Note:  .   Date:  07/25/2023  ID:  Adrian Foley, DOB [birth year], MRN [id number] PCP: [name], MD  [facility] Providers Cardiologist:  Chilton Si, MD    History of Present Illness: .   Adrian Foley is a 72 y.o. male with history of CAD s/p CABG and subsequent PCI, DM2, HLD.  Noted 7/22 - 07/12/2023 after presenting with STEMI symptomatic with substernal chest pain.  Culprit lesion found be 100% thrombotic occlusion of SVG-RPL.  Successful PCI of SVG after extensive angioplasty, aspiration thrombectomy, stent placement.  LVEF on LHC 45%.  TTE day of discharge LVEF 55 to 60%, posterior wall and basal inferior segment hypokinetic.  Discharged on Effient for at least 6 months, aspirin, Crestor, as needed nitroglycerin.  No beta-blocker due to bradycardia.  No ARNI due to relative hypotension but could be considered as outpatient.  [name] was initiated.  Presents today for follow up with his wife. Drinks 3 glasses of water, 1 cup of coffee in the morning. Eating three meals per day and some snacking. Walks to his yard 150 yards to feed the deer and plans to continue to progress.  Does note  lightheadedness with position changes. His right ring and middle finger have been a little numb since returning home - LHC was through right radial access but multiple IV sites to LUE.   Reports no shortness of breath nor dyspnea on exertion. Reports no chest pain, pressure, or tightness. No edema, orthopnea, PND. Reports no palpitations.    ROS: Please see the history of present illness.    All other systems reviewed and are negative.   Studies Reviewed: .        Cardiac Studies & Procedures   CARDIAC CATHETERIZATION  CARDIAC CATHETERIZATION 07/11/2023  Narrative   -------------SEVERE OCCLUSIVE 3 VESSEL NATIVE CAD---------------------------   Prox RCA to Dist RCA lesion is 100% stenosed with 100% stenosed side branch in RPAV.   Prox LAD to Mid LAD lesion is 70%  stenosed - ISR   Mid Cx to Dist Cx lesion is 99% stenosed with 90% stenosed side branch in 2nd Mrg.  2nd Mrg lesion is 100% stenosed.   ---------------GRAFTS----------------------------   SVG-1st Daig graft was visualized by angiography and WAS normal in caliber. Vessel is normal; Origin to Prox Graft lesion is 100% stenosed.   SVG-OM1 prox Graft lesion is 100% stenosed. - >  Graft is not visualized, but the free LIMA-LAD comes from this hood.  LIMA-LAD graft was visualized by angiography angiographically normal, but slow flow down the LAD.   -------------------CULPRIT LESION-PCI---------------------------------   SVG-RPL graft was visualized by angiography and is normal in caliber. Origin lesion is 90% stenosed. Prox Graft lesion is 100% stenosed.  (After restoring flow: Insertion lesion at RPL is 70% stenosed. 2nd RPL lesion is 90% stenosed - obstructive antegrade flow ; unable to cross with wire)   A drug-eluting stent was successfully placed in the Ostial/Prox SVG, using a SYNERGY XD 3.0X38.  Post intervention, there is a 0% residual stenosis throughout most of the stent, but ~20 % ISR at the ostial lesion.   -----------------HEMODYNAMICS--------------------------------------   There is mild to moderate left ventricular systolic dysfunction.  The left ventricular ejection fraction is 35-45% by visual estimate.   LV end diastolic pressure is normal.     POST-OPERATIVE DIAGNOSIS: Severe native CAD with essentially occluded RCA, LCx and LAD with extensive stents in the LAD that are occluded. CULPRIT LESION: 100% thombotic occlusion  of SVG-RPL ~Successful PCI of SVG after extensive angioplasty, penumbra aspiration thrombectomy and stent placement in the hospital proximal lesion with a 3.0 mm x 38 mm Synergy XD stent with the proximal half postdilated to 3.4 mm in the ostium postdilated to 3.6 mm. Patent Free LIMA to LAD which supposedly was sewn onto the hood of SVG-OM which is not visible.   SVG-RI also appears to be occluded. EF appears to be relatively preserved with inferior hypokinesis.  EF 45%.  LVEDP 24 mmHg.   RECOMMENDATIONS Admit to ICU for ongoing care will run 2 hours of Kengreal followed by long-term DAPT. Check 2D echo along with lipid panel and A1c. Will use sliding scale insulin for now and increase rosuvastatin home dose up to 40 mg. BP is relatively low & HR in 40-50 range, therefore we will hold off on initiating beta-blocker or ARB until we see has pressures normalizing out.. Consider Coronary CTA to visualize other 2 remaining SVGs; SVG-Diag appears to be occluded, & SVG-OM was not visualized despite a patent Free LIMA sewn to the hood. Following Year 1 - would continue Maintenance dose Brilinta 60 mg BID or clopidogrel 75 mg daily to complete Year 2    [name], MD  Findings Coronary Findings Diagnostic  Dominance: Right  Left Main Vessel was injected. Vessel is normal in caliber. There is severe focal disease in the vessel.  Left Anterior Descending Prox LAD to Mid LAD lesion is 70% stenosed. The lesion was previously treated . Mid LAD lesion is 100% stenosed.  First Diagonal Branch Vessel is small in size.  Second Diagonal Branch Vessel is moderate in size.  Left Circumflex Vessel is large. Mid Cx to Dist Cx lesion is 99% stenosed with 90% stenosed side branch in 2nd Mrg.  First Obtuse Marginal Branch Vessel is small in size.  Second Obtuse Marginal Branch Vessel is large in size. 2nd Mrg lesion is 100% stenosed.  Right Coronary Artery Vessel was injected. Vessel is normal in caliber. There is severe focal disease in the vessel. Prox RCA to Dist RCA lesion is 100% stenosed with 100% stenosed side branch in RPAV.  First Right Posterolateral Branch Vessel is small in size.  Second Right Posterolateral Branch Vessel is large in size. 2nd RPL lesion is 90% stenosed. Unable to cross lesion with wire.  Saphenous Graft To 2nd  Mrg SVG graft was not visualized due to known occlusion and is normal in caliber.  Unable to visulaize beyond take-off of Free LIMA. Prox Graft lesion is 100% stenosed.  LIMA Y-graft Branch To Mid LAD LIMA graft was visualized by angiography.  Free LIMA -attached to SVG-OM1  Saphenous Graft To 1st Diag SVG graft was visualized by angiography and is normal in caliber.  SVG-Diag (RI) The graft exhibits severe diffuse disease. Flush Occluded Origin to Prox Graft lesion is 100% stenosed.  Saphenous Graft To 2nd RPL SVG graft was visualized by angiography and is normal in caliber.  SVG-RPL Origin lesion is 90% stenosed. The lesion is discrete and eccentric. The lesion is calcified. Origin to Prox Graft lesion is 100% stenosed. Insertion lesion is 70% stenosed.  Intervention  Origin lesion (Saphenous Graft To 2nd RPL) Stent (Also treats lesions: Origin to Prox Graft) Lesion length:  26 mm. CATHETER LAUNCHER 6FR AL1 guide catheter was inserted. Lesion crossed with guidewire using a WIRE ASAHI PROWATER 180CM. Pre-stent angioplasty was performed using a BALLN EMERGE MR 2.5X20. Maximum pressure:  10 atm. Inflation time:  15 sec. Inflations were  actually made up and down the entire graft A drug-eluting stent was successfully placed using a SYNERGY XD 3.0X38. After final balloon angioplasty of the more proximal lesion, flow was finally restored.  I therefore decided to stent this area. Maximum pressure: 16 atm. Inflation time: 30 sec. Post-stent angioplasty was performed using a BALL SAPPHIRE NC24 3.5X8. Maximum pressure:  16 atm. Inflation time:  20 sec. BALLN Vivian EMERGE MR 3.25X15  -proximal 1/2-1/3 of stent postdilated to 3.3 mm; tomorrow ostial segment with calcified eccentric lesion was postdilated with a 3.5 mm balloon. Stent tapered from 3.6-3.3 Post-Intervention Lesion Assessment The intervention was successful. Pre-interventional TIMI flow is 0. Post-intervention TIMI flow is 2. No-reflow occurred  during the intervention. IC nitroglycerin and verapamil was given. Eventually TIMI-3 flow was restored but then after post dilation of the proximal portion of the stent, we are back to TIMI II flow There is a 20% residual stenosis post intervention.  Origin to Prox Graft lesion (Saphenous Graft To 2nd RPL) Thrombectomy Stent (Also treats lesions: Origin) See details in Origin lesion (Saphenous Graft To 2nd RPL). Post-Intervention Lesion Assessment The intervention was successful. Pre-interventional TIMI flow is 0. Post-intervention TIMI flow is 2. Treated lesion length:  38 mm. No-reflow occurred during the intervention. IC nitroglycerin and verapamil was given. There is a 0% residual stenosis post intervention.   CARDIAC CATHETERIZATION  CARDIAC CATHETERIZATION 12/23/2015  Narrative Images from the original result were not included. 1. Ost RCA to Dist RCA lesion, 100% stenosed. Dist RCA lesion, 99% stenosed. Ost RPDA lesion, 100% stenosed. Post Atrio lesion, 90% stenosed. 2. Prox LAD to Mid LAD lesion, 95% stenosed. The lesion was previously treated with a bare metal stent greater than two years ago. Severe IN-Stent Restenosis 3. Dist LAD lesion, 100% stenosed beond D2. 2nd Diag lesion, 70% stenosed. 4. 2nd Mrg lesion, 65% stenosed. 5. The left ventricular systolic function is normal.   Severe multivessel disease with the best option being bypass surgery. The LAD has extensive stent with diffuse in-stent restenosis and < TIMI 1 flow distally. There are some left to right collaterals filling the distal LAD which then fills the PDA with collaterals.  The RCA is totally occluded with minimal retrograde filling beyond the bifurcation. There is extensive posterolateral branch that has collaterals, but no retrograde filling of the PDA.  Plan:  Return to nursing unit or TR band removal.  CT surgical consultation has been called  Restart heparin 6 hours posterior TR band removal.  Continue  nitroglycerin infusion.  Continue risk factor modification.  Bed rest until CABG    HARDING, Piedad Climes, M.D., M.S. Interventional Cardiologist  Pager # [phone]  Findings Coronary Findings Diagnostic  Dominance: Right  Left Main Vessel was injected. Vessel is large. JL3.5 CATHETER used located proximal to major branch.  Left Anterior Descending . Vessel is large. Vessel begins a very large caliber vessel, but after D2 has &lt;TIMI 1 flow with distal vessel being filled by a left to left collaterals from the distal circumflex Diffuse eccentric ulcerativeand has left-to-left collateral flow. The lesion was previously treated with a bare metal stent greater than two years ago. Diffuse.  Appeared to be subtotal occluded with less &lt; TIMI 1 flow  First Septal Branch The vessel is moderate in size and is angiographically normal.  Second Diagonal Branch Tubular eccentric.  Second Septal Branch The vessel is small in size.  Third Diagonal Branch The vessel is small in size.  Third Septal Branch The vessel is small  in size. Collaterals 3rd Sept filled by collaterals from 2nd Mrg.  Left Circumflex . Vessel is large.  First Obtuse Marginal Branch The vessel is small in size.  Second Obtuse Marginal Branch The vessel is large in size. Diffuse tubular eccentric.  Lateral Second Obtuse Marginal Branch The vessel is small in size.  Third Obtuse Marginal Branch The vessel is small in size.  Right Coronary Artery . Vessel is large. Diffuse chronic total occlusion located proximal to major branch. Moderately Calcified diffuse eccentric.  Right Posterior Descending Artery The vessel is moderate in size. Collaterals RPDA filled by collaterals from Dist LAD.  Chronic total occlusion located at the major branch.  Right Posterior Atrioventricular Artery Tubular.  First Right Posterolateral Branch The vessel is small in size.  Second Right Posterolateral  Branch The vessel is moderate in size. Collaterals 2nd RPL filled by collaterals from Lat 3rd Mrg.  Collaterals 2nd RPL filled by collaterals from Dist Cx.  Third Right Posterolateral Branch The vessel is small in size. Collaterals 3rd RPL filled by collaterals from Dist Cx.  Intervention  No interventions have been documented.   STRESS TESTS  MYOCARDIAL PERFUSION IMAGING 03/04/2023  Narrative   The study is normal. The study is low risk.   LV perfusion is normal. There is no evidence of ischemia. There is no evidence of infarction.   Left ventricular function is normal. End diastolic cavity size is normal. End systolic cavity size is normal.   Prior study not available for comparison.  Normal stress nuclear study with no ischemia or infarction; gated EF 60 with normal wall motion.   ECHOCARDIOGRAM  ECHOCARDIOGRAM COMPLETE 07/12/2023  Narrative ECHOCARDIOGRAM REPORT    Patient Name:   Adrian Foley Date of Exam: 07/12/2023 Medical Rec #:  [id number]     Height:       69.0 in Accession #:    [id number]    Weight:       190.0 lb Date of Birth:  04-15-1951      BSA:          2.021 m Patient Age:    71 years      BP:           103/59 mmHg Patient Gender: M             HR:           42 bpm. Exam Location:  Inpatient  Procedure: 2D Echo, Cardiac Doppler and Color Doppler  Indications:    Acute myocardial infarction, unspecified I21.9, CAD Native Vessel I25.10  History:        Patient has prior history of Echocardiogram examinations, most recent 12/21/2015. CAD, Angina and Acute MI, Prior CABG; Risk Factors:Diabetes and Dyslipidemia.  Sonographer:    [name] Referring Phys: [id number] DAVID W HARDING  IMPRESSIONS   1. Left ventricular ejection fraction, by estimation, is 55 to 60%. The left ventricle has normal function. The left ventricle demonstrates regional wall motion abnormalities (see scoring diagram/findings for description). Diastolic function  is indeterminate due to marked bradycardia. 2. Right ventricular systolic function is normal. The right ventricular size is normal. Tricuspid regurgitation signal is inadequate for assessing PA pressure. 3. Left atrial size was mildly dilated. 4. The mitral valve is normal in structure. Trivial mitral valve regurgitation. No evidence of mitral stenosis. 5. The aortic valve is tricuspid. Aortic valve regurgitation is not visualized. No aortic stenosis is present. 6. There is borderline dilatation of the ascending aorta, measuring 39 mm.  Comparison(s): Prior images reviewed side by side.  FINDINGS Left Ventricle: Left ventricular ejection fraction, by estimation, is 55 to 60%. The left ventricle has normal function. The left ventricle demonstrates regional wall motion abnormalities. The left ventricular internal cavity size was normal in size. There is no left ventricular hypertrophy. Diastolic function is indeterminate due to marked bradycardia.   LV Wall Scoring: The posterior wall and basal inferior segment are hypokinetic.  Right Ventricle: The right ventricular size is normal. No increase in right ventricular wall thickness. Right ventricular systolic function is normal. Tricuspid regurgitation signal is inadequate for assessing PA pressure.  Left Atrium: Left atrial size was mildly dilated.  Right Atrium: Right atrial size was normal in size.  Pericardium: There is no evidence of pericardial effusion.  Mitral Valve: The mitral valve is normal in structure. Trivial mitral valve regurgitation. No evidence of mitral valve stenosis.  Tricuspid Valve: The tricuspid valve is normal in structure. Tricuspid valve regurgitation is trivial.  Aortic Valve: The aortic valve is tricuspid. Aortic valve regurgitation is not visualized. No aortic stenosis is present. Aortic valve peak gradient measures 4.3 mmHg.  Pulmonic Valve: The pulmonic valve was normal in structure. Pulmonic valve  regurgitation is not visualized. No evidence of pulmonic stenosis.  Aorta: The aortic root is normal in size and structure. There is borderline dilatation of the ascending aorta, measuring 39 mm.  Venous: The inferior vena cava was not well visualized.  IAS/Shunts: No atrial level shunt detected by color flow Doppler.   LEFT VENTRICLE PLAX 2D LVIDd:         4.20 cm   Diastology LVIDs:         3.00 cm   LV e' medial:    6.83 cm/s LV PW:         1.20 cm   LV E/e' medial:  13.3 LV IVS:        0.90 cm   LV e' lateral:   9.55 cm/s LVOT diam:     2.10 cm   LV E/e' lateral: 9.5 LV SV:         61 LV SV Index:   30 LVOT Area:     3.46 cm   RIGHT VENTRICLE RV S prime:     8.27 cm/s TAPSE (M-mode): 1.3 cm  LEFT ATRIUM             Index        RIGHT ATRIUM           Index LA diam:        3.90 cm 1.93 cm/m   RA Area:     15.80 cm LA Vol (A2C):   48.8 ml 24.14 ml/m  RA Volume:   31.70 ml  15.68 ml/m LA Vol (A4C):   45.6 ml 22.56 ml/m LA Biplane Vol: 49.7 ml 24.59 ml/m AORTIC VALVE                 PULMONIC VALVE AV Area (Vmax): 2.76 cm     PR End Diast Vel: 5.90 msec AV Vmax:        104.00 cm/s AV Peak Grad:   4.3 mmHg LVOT Vmax:      82.83 cm/s LVOT Vmean:     49.800 cm/s LVOT VTI:       0.175 m  AORTA Ao Root diam: 3.60 cm Ao Asc diam:  3.90 cm  MITRAL VALVE MV Area (PHT): 4.80 cm    SHUNTS MV Decel Time: 158 msec  Systemic VTI:  0.18 m MR Peak grad: 14.4 mmHg    Systemic Diam: 2.10 cm MR Vmax:      190.00 cm/s MV E velocity: 90.60 cm/s MV A velocity: 52.00 cm/s MV E/A ratio:  1.74  Mihai Croitoru MD Electronically signed by Thurmon Fair MD Signature Date/Time: 07/12/2023/12:36:44 PM    Final             Risk Assessment/Calculations:          Physical Exam:   VS:  BP 112/72 (BP Location: Left Arm, Patient Position: Sitting, Cuff Size: Normal)   Pulse (!) 49   Ht 5\' 9"  (1.753 m)   Wt 192 lb 1.6 oz (87.1 kg)   BMI 28.37 kg/m    Wt Readings from Last  3 Encounters:  07/21/23 192 lb 1.6 oz (87.1 kg)  07/11/23 190 lb (86.2 kg)  06/08/23 190 lb (86.2 kg)    GEN: Well nourished, well developed in no acute distress NECK: No JVD; No carotid bruits CARDIAC: RRR, no murmurs, rubs, gallops RESPIRATORY:  Clear to auscultation without rales, wheezing or rhonchi  ABDOMEN: Soft, non-tender, non-distended EXTREMITIES:  No edema; No deformity.  Right radial catheterization site with mild yellowed  ASSESSMENT AND PLAN: .    CAD s/p CABG with subsequent STEMI 07/11/23 DES-SVB-posterior lateral branch - Stable with no anginal symptoms. R radial cath site healing appropriately. Recommended for DAPT for early 6 months aspirin/Plavix.  Additional GDMT Jardiance, Rosuvastatin. Beta blocker deferred due to bradycardia which is asymptomatic. LVEF on 45%-unable to add ARNI due to relative hypotension. Appropriate to begin cardiac rehab. Plans [facility].    HLD with statin intolerance -prior intolerance to atorvastatin, simvastatin.  Discharged on rosuvastatin 40 mg daily. Tolerating thus far. Labs in 6 weeks FLP/CMP. If LDL not at goal consider PCSK9i vs Zetia.   DM2 - 02/28/23 A1c 6.9. On Jardiance. Continue to follow with PCP.        Dispo: follow up in 3 months  Signed, Alver Sorrow, NP

## 2023-07-25 ENCOUNTER — Encounter (HOSPITAL_BASED_OUTPATIENT_CLINIC_OR_DEPARTMENT_OTHER): Payer: Self-pay | Admitting: Family

## 2023-10-24 ENCOUNTER — Encounter (HOSPITAL_BASED_OUTPATIENT_CLINIC_OR_DEPARTMENT_OTHER): Payer: Self-pay | Admitting: Family

## 2023-10-24 ENCOUNTER — Ambulatory Visit (INDEPENDENT_AMBULATORY_CARE_PROVIDER_SITE_OTHER): Payer: Medicare Other | Admitting: Family

## 2023-10-24 VITALS — BP 122/70 | Ht 69.0 in | Wt 191.8 lb

## 2023-10-24 DIAGNOSIS — I25118 Atherosclerotic heart disease of native coronary artery with other forms of angina pectoris: Secondary | ICD-10-CM

## 2023-10-24 DIAGNOSIS — E785 Hyperlipidemia, unspecified: Secondary | ICD-10-CM | POA: Diagnosis not present

## 2023-10-24 MED ORDER — CLOPIDOGREL BISULFATE 75 MG PO TABS: 75.0000 mg | ORAL_TABLET | Freq: Every day | ORAL | 2 refills | Status: DC

## 2023-10-24 NOTE — Progress Notes (Signed)
Cardiology Office Note:  .   Date:  10/24/2023  ID:  Adrian Foley, DOB 1951-05-11, MRN [id number] PCP: [name], MD  [facility] Providers Cardiologist:  Chilton Si, MD    History of Present Illness: .   Adrian Foley is a 72 y.o. male with history of CAD s/p CABG and subsequent PCI, DM2, HLD.   Noted 7/22 - 07/12/2023 after presenting with STEMI symptomatic with substernal chest pain.  Culprit lesion found be 100% thrombotic occlusion of SVG-RPL.  Successful PCI of SVG after extensive angioplasty, aspiration thrombectomy, stent placement.  LVEF on LHC 45%.  TTE day of discharge LVEF 55 to 60%, posterior wall and basal inferior segment hypokinetic.  Discharged on Effient for at least 6 months, aspirin, Crestor, as needed nitroglycerin.  No beta-blocker due to bradycardia.  No ARNI due to relative hypotension but could be considered as outpatient.  [name] was initiated.  Seen in follow up 07/21/23 doing well from cardiac perspective. Planned to participate in cardiac rehab at [facility].  He was tolerating rosuvastatin (previously intolerant to atorvastatin, simvastatin (repeat labs ordered in 6 weeks but not collected.   Presents today for follow up with his wife. 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 Reports no shortness of breath nor dyspnea on exertion. Reports no chest pain, pressure, or tightness. No edema, orthopnea, PND. Reports no palpitations.  Took Jardiance x 3 weekst hen felt his kidneys were hurting which resolved after being of of it. Tolerating Rosuvastatin without difficult. Does note easy bruising with DAPT but no hematuria nor melena.   ROS: Please see the history of present illness.    All other systems reviewed and are negative.   Studies Reviewed: .        Cardiac Studies & Procedures   CARDIAC CATHETERIZATION  CARDIAC CATHETERIZATION 07/11/2023  Narrative   -------------SEVERE OCCLUSIVE 3  VESSEL NATIVE CAD---------------------------   Prox RCA to Dist RCA lesion is 100% stenosed with 100% stenosed side branch in RPAV.   Prox LAD to Mid LAD lesion is 70% stenosed - ISR   Mid Cx to Dist Cx lesion is 99% stenosed with 90% stenosed side branch in 2nd Mrg.  2nd Mrg lesion is 100% stenosed.   ---------------GRAFTS----------------------------   SVG-1st Daig graft was visualized by angiography and WAS normal in caliber. Vessel is normal; Origin to Prox Graft lesion is 100% stenosed.   SVG-OM1 prox Graft lesion is 100% stenosed. - >  Graft is not visualized, but the free LIMA-LAD comes from this hood.  LIMA-LAD graft was visualized by angiography angiographically normal, but slow flow down the LAD.   -------------------CULPRIT LESION-PCI---------------------------------   SVG-RPL graft was visualized by angiography and is normal in caliber. Origin lesion is 90% stenosed. Prox Graft lesion is 100% stenosed.  (After restoring flow: Insertion lesion at RPL is 70% stenosed. 2nd RPL lesion is 90% stenosed - obstructive antegrade flow ; unable to cross with wire)   A drug-eluting stent was successfully placed in the Ostial/Prox SVG, using a SYNERGY XD 3.0X38.  Post intervention, there is a 0% residual stenosis throughout most of the stent, but ~20 % ISR at the ostial lesion.   -----------------HEMODYNAMICS--------------------------------------   There is mild to moderate left ventricular systolic dysfunction.  The left ventricular ejection fraction is 35-45% by visual estimate.   LV end diastolic pressure is normal.     POST-OPERATIVE DIAGNOSIS: Severe native CAD with essentially occluded RCA, LCx and  LAD with extensive stents in the LAD that are occluded. CULPRIT LESION: 100% thombotic occlusion of SVG-RPL ~Successful PCI of SVG after extensive angioplasty, penumbra aspiration thrombectomy and stent placement in the hospital proximal lesion with a 3.0 mm x 38 mm Synergy XD stent with the  proximal half postdilated to 3.4 mm in the ostium postdilated to 3.6 mm. Patent Free LIMA to LAD which supposedly was sewn onto the hood of SVG-OM which is not visible.  SVG-RI also appears to be occluded. EF appears to be relatively preserved with inferior hypokinesis.  EF 45%.  LVEDP 24 mmHg.   RECOMMENDATIONS Admit to ICU for ongoing care will run 2 hours of Kengreal followed by long-term DAPT. Check 2D echo along with lipid panel and A1c. Will use sliding scale insulin for now and increase rosuvastatin home dose up to 40 mg. BP is relatively low & HR in 40-50 range, therefore we will hold off on initiating beta-blocker or ARB until we see has pressures normalizing out.. Consider Coronary CTA to visualize other 2 remaining SVGs; SVG-Diag appears to be occluded, & SVG-OM was not visualized despite a patent Free LIMA sewn to the hood. Following Year 1 - would continue Maintenance dose Brilinta 60 mg BID or clopidogrel 75 mg daily to complete Year 2    Adrian Lemma, MD  Findings Coronary Findings Diagnostic  Dominance: Right  Left Main Vessel was injected. Vessel is normal in caliber. There is severe focal disease in the vessel.  Left Anterior Descending Prox LAD to Mid LAD lesion is 70% stenosed. The lesion was previously treated . Mid LAD lesion is 100% stenosed.  First Diagonal Branch Vessel is small in size.  Second Diagonal Branch Vessel is moderate in size.  Left Circumflex Vessel is large. Mid Cx to Dist Cx lesion is 99% stenosed with 90% stenosed side branch in 2nd Mrg.  First Obtuse Marginal Branch Vessel is small in size.  Second Obtuse Marginal Branch Vessel is large in size. 2nd Mrg lesion is 100% stenosed.  Right Coronary Artery Vessel was injected. Vessel is normal in caliber. There is severe focal disease in the vessel. Prox RCA to Dist RCA lesion is 100% stenosed with 100% stenosed side branch in RPAV.  First Right Posterolateral Branch Vessel is  small in size.  Second Right Posterolateral Branch Vessel is large in size. 2nd RPL lesion is 90% stenosed. Unable to cross lesion with wire.  Saphenous Graft To 2nd Mrg SVG graft was not visualized due to known occlusion and is normal in caliber.  Unable to visulaize beyond take-off of Free LIMA. Prox Graft lesion is 100% stenosed.  LIMA Y-graft Branch To Mid LAD LIMA graft was visualized by angiography.  Free LIMA -attached to SVG-OM1  Saphenous Graft To 1st Diag SVG graft was visualized by angiography and is normal in caliber.  SVG-Diag (RI) The graft exhibits severe diffuse disease. Flush Occluded Origin to Prox Graft lesion is 100% stenosed.  Saphenous Graft To 2nd RPL SVG graft was visualized by angiography and is normal in caliber.  SVG-RPL Origin lesion is 90% stenosed. The lesion is discrete and eccentric. The lesion is calcified. Origin to Prox Graft lesion is 100% stenosed. Insertion lesion is 70% stenosed.  Intervention  Origin lesion (Saphenous Graft To 2nd RPL) Stent (Also treats lesions: Origin to Prox Graft) Lesion length:  26 mm. CATHETER LAUNCHER 6FR AL1 guide catheter was inserted. Lesion crossed with guidewire using a WIRE ASAHI PROWATER 180CM. Pre-stent angioplasty was performed using a BALLN  EMERGE MR 2.5X20. Maximum pressure:  10 atm. Inflation time:  15 sec. Inflations were actually made up and down the entire graft A drug-eluting stent was successfully placed using a SYNERGY XD 3.0X38. After final balloon angioplasty of the more proximal lesion, flow was finally restored.  I therefore decided to stent this area. Maximum pressure: 16 atm. Inflation time: 30 sec. Post-stent angioplasty was performed using a BALL SAPPHIRE NC24 3.5X8. Maximum pressure:  16 atm. Inflation time:  20 sec. BALLN [place] EMERGE MR 3.25X15  -proximal 1/2-1/3 of stent postdilated to 3.3 mm; tomorrow ostial segment with calcified eccentric lesion was postdilated with a 3.5 mm balloon. Stent tapered  from 3.6-3.3 Post-Intervention Lesion Assessment The intervention was successful. Pre-interventional TIMI flow is 0. Post-intervention TIMI flow is 2. No-reflow occurred during the intervention. IC nitroglycerin and verapamil was given. Eventually TIMI-3 flow was restored but then after post dilation of the proximal portion of the stent, we are back to TIMI II flow There is a 20% residual stenosis post intervention.  Origin to Prox Graft lesion (Saphenous Graft To 2nd RPL) Thrombectomy Stent (Also treats lesions: Origin) See details in Origin lesion (Saphenous Graft To 2nd RPL). Post-Intervention Lesion Assessment The intervention was successful. Pre-interventional TIMI flow is 0. Post-intervention TIMI flow is 2. Treated lesion length:  38 mm. No-reflow occurred during the intervention. IC nitroglycerin and verapamil was given. There is a 0% residual stenosis post intervention.   CARDIAC CATHETERIZATION  CARDIAC CATHETERIZATION 12/23/2015  Narrative Images from the original result were not included. 1. Ost RCA to Dist RCA lesion, 100% stenosed. Dist RCA lesion, 99% stenosed. Ost RPDA lesion, 100% stenosed. Post Atrio lesion, 90% stenosed. 2. Prox LAD to Mid LAD lesion, 95% stenosed. The lesion was previously treated with a bare metal stent greater than two years ago. Severe IN-Stent Restenosis 3. Dist LAD lesion, 100% stenosed beond D2. 2nd Diag lesion, 70% stenosed. 4. 2nd Mrg lesion, 65% stenosed. 5. The left ventricular systolic function is normal.   Severe multivessel disease with the best option being bypass surgery. The LAD has extensive stent with diffuse in-stent restenosis and < TIMI 1 flow distally. There are some left to right collaterals filling the distal LAD which then fills the PDA with collaterals.  The RCA is totally occluded with minimal retrograde filling beyond the bifurcation. There is extensive posterolateral branch that has collaterals, but no retrograde filling of  the PDA.  Plan:  Return to nursing unit or TR band removal.  CT surgical consultation has been called  Restart heparin 6 hours posterior TR band removal.  Continue nitroglycerin infusion.  Continue risk factor modification.  Bed rest until CABG    HARDING, Piedad Climes, M.D., M.S. Interventional Cardiologist  Pager # [phone]  Findings Coronary Findings Diagnostic  Dominance: Right  Left Main Vessel was injected. Vessel is large. JL3.5 CATHETER used located proximal to major branch.  Left Anterior Descending . Vessel is large. Vessel begins a very large caliber vessel, but after D2 has &lt;TIMI 1 flow with distal vessel being filled by a left to left collaterals from the distal circumflex Diffuse eccentric ulcerativeand has left-to-left collateral flow. The lesion was previously treated with a bare metal stent greater than two years ago. Diffuse.  Appeared to be subtotal occluded with less &lt; TIMI 1 flow  First Septal Branch The vessel is moderate in size and is angiographically normal.  Second Diagonal Branch Tubular eccentric.  Second Septal Branch The vessel is small in size.  Third Diagonal  Branch The vessel is small in size.  Third Septal Branch The vessel is small in size. Collaterals 3rd Sept filled by collaterals from 2nd Mrg.  Left Circumflex . Vessel is large.  First Obtuse Marginal Branch The vessel is small in size.  Second Obtuse Marginal Branch The vessel is large in size. Diffuse tubular eccentric.  Lateral Second Obtuse Marginal Branch The vessel is small in size.  Third Obtuse Marginal Branch The vessel is small in size.  Right Coronary Artery . Vessel is large. Diffuse chronic total occlusion located proximal to major branch. Moderately Calcified diffuse eccentric.  Right Posterior Descending Artery The vessel is moderate in size. Collaterals RPDA filled by collaterals from Dist LAD.  Chronic total occlusion located at  the major branch.  Right Posterior Atrioventricular Artery Tubular.  First Right Posterolateral Branch The vessel is small in size.  Second Right Posterolateral Branch The vessel is moderate in size. Collaterals 2nd RPL filled by collaterals from Lat 3rd Mrg.  Collaterals 2nd RPL filled by collaterals from Dist Cx.  Third Right Posterolateral Branch The vessel is small in size. Collaterals 3rd RPL filled by collaterals from Dist Cx.  Intervention  No interventions have been documented.   STRESS TESTS  MYOCARDIAL PERFUSION IMAGING 03/04/2023  Interpretation Summary   The study is normal. The study is low risk.   LV perfusion is normal. There is no evidence of ischemia. There is no evidence of infarction.   Left ventricular function is normal. End diastolic cavity size is normal. End systolic cavity size is normal.   Prior study not available for comparison.  Normal stress nuclear study with no ischemia or infarction; gated EF 60 with normal wall motion.   ECHOCARDIOGRAM  ECHOCARDIOGRAM COMPLETE 07/12/2023  Narrative ECHOCARDIOGRAM REPORT    Patient Name:   Adrian Foley Date of Exam: 07/12/2023 Medical Rec #:  [id number]     Height:       69.0 in Accession #:    [id number]    Weight:       190.0 lb Date of Birth:  11-Sep-1951      BSA:          2.021 m Patient Age:    71 years      BP:           103/59 mmHg Patient Gender: M             HR:           42 bpm. Exam Location:  Inpatient  Procedure: 2D Echo, Cardiac Doppler and Color Doppler  Indications:    Acute myocardial infarction, unspecified I21.9, CAD Native Vessel I25.10  History:        Patient has prior history of Echocardiogram examinations, most recent 12/21/2015. CAD, Angina and Acute MI, Prior CABG; Risk Factors:Diabetes and Dyslipidemia.  Sonographer:    [name] Referring Phys: 46 DAVID W HARDING  IMPRESSIONS   1. Left ventricular ejection fraction, by estimation, is 55 to 60%. The  left ventricle has normal function. The left ventricle demonstrates regional wall motion abnormalities (see scoring diagram/findings for description). Diastolic function is indeterminate due to marked bradycardia. 2. Right ventricular systolic function is normal. The right ventricular size is normal. Tricuspid regurgitation signal is inadequate for assessing PA pressure. 3. Left atrial size was mildly dilated. 4. The mitral valve is normal in structure. Trivial mitral valve regurgitation. No evidence of mitral stenosis. 5. The aortic valve is tricuspid. Aortic valve regurgitation is not visualized. No  aortic stenosis is present. 6. There is borderline dilatation of the ascending aorta, measuring 39 mm.  Comparison(s): Prior images reviewed side by side.  FINDINGS Left Ventricle: Left ventricular ejection fraction, by estimation, is 55 to 60%. The left ventricle has normal function. The left ventricle demonstrates regional wall motion abnormalities. The left ventricular internal cavity size was normal in size. There is no left ventricular hypertrophy. Diastolic function is indeterminate due to marked bradycardia.   LV Wall Scoring: The posterior wall and basal inferior segment are hypokinetic.  Right Ventricle: The right ventricular size is normal. No increase in right ventricular wall thickness. Right ventricular systolic function is normal. Tricuspid regurgitation signal is inadequate for assessing PA pressure.  Left Atrium: Left atrial size was mildly dilated.  Right Atrium: Right atrial size was normal in size.  Pericardium: There is no evidence of pericardial effusion.  Mitral Valve: The mitral valve is normal in structure. Trivial mitral valve regurgitation. No evidence of mitral valve stenosis.  Tricuspid Valve: The tricuspid valve is normal in structure. Tricuspid valve regurgitation is trivial.  Aortic Valve: The aortic valve is tricuspid. Aortic valve regurgitation is not  visualized. No aortic stenosis is present. Aortic valve peak gradient measures 4.3 mmHg.  Pulmonic Valve: The pulmonic valve was normal in structure. Pulmonic valve regurgitation is not visualized. No evidence of pulmonic stenosis.  Aorta: The aortic root is normal in size and structure. There is borderline dilatation of the ascending aorta, measuring 39 mm.  Venous: The inferior vena cava was not well visualized.  IAS/Shunts: No atrial level shunt detected by color flow Doppler.   LEFT VENTRICLE PLAX 2D LVIDd:         4.20 cm   Diastology LVIDs:         3.00 cm   LV e' medial:    6.83 cm/s LV PW:         1.20 cm   LV E/e' medial:  13.3 LV IVS:        0.90 cm   LV e' lateral:   9.55 cm/s LVOT diam:     2.10 cm   LV E/e' lateral: 9.5 LV SV:         61 LV SV Index:   30 LVOT Area:     3.46 cm   RIGHT VENTRICLE RV S prime:     8.27 cm/s TAPSE (M-mode): 1.3 cm  LEFT ATRIUM             Index        RIGHT ATRIUM           Index LA diam:        3.90 cm 1.93 cm/m   RA Area:     15.80 cm LA Vol (A2C):   48.8 ml 24.14 ml/m  RA Volume:   31.70 ml  15.68 ml/m LA Vol (A4C):   45.6 ml 22.56 ml/m LA Biplane Vol: 49.7 ml 24.59 ml/m AORTIC VALVE                 PULMONIC VALVE AV Area (Vmax): 2.76 cm     PR End Diast Vel: 5.90 msec AV Vmax:        104.00 cm/s AV Peak Grad:   4.3 mmHg LVOT Vmax:      82.83 cm/s LVOT Vmean:     49.800 cm/s LVOT VTI:       0.175 m  AORTA Ao Root diam: 3.60 cm Ao Asc diam:  3.90 cm  MITRAL  VALVE MV Area (PHT): 4.80 cm    SHUNTS MV Decel Time: 158 msec    Systemic VTI:  0.18 m MR Peak grad: 14.4 mmHg    Systemic Diam: 2.10 cm MR Vmax:      190.00 cm/s MV E velocity: 90.60 cm/s MV A velocity: 52.00 cm/s MV E/A ratio:  1.74  Mihai Croitoru MD Electronically signed by Thurmon Fair MD Signature Date/Time: 07/12/2023/12:36:44 PM    Final             Risk Assessment/Calculations:             Physical Exam:   VS:  BP 122/70 (BP  Location: Right Arm, Patient Position: Sitting, Cuff Size: Normal)   Wt 191 lb 12.8 oz (87 kg)   BMI 28.32 kg/m    Wt Readings from Last 3 Encounters:  10/24/23 191 lb 12.8 oz (87 kg)  07/21/23 192 lb 1.6 oz (87.1 kg)  07/11/23 190 lb (86.2 kg)    GEN: Well nourished, well developed in no acute distress NECK: No JVD; No carotid bruits CARDIAC: RRR, no murmurs, rubs, gallops RESPIRATORY:  Clear to auscultation without rales, wheezing or rhonchi  ABDOMEN: Soft, non-tender, non-distended EXTREMITIES:  No edema; No deformity   ASSESSMENT AND PLAN: .   CAD s/p CABG with subsequent STEMI 07/11/23 DES-SVB-posterior lateral branch - Stable with no anginal symptoms. Recommended for DAPT for early 6 months aspirin/Plavix, will stop Plavix 01/12/24 and continue Aspirin 81mg  daily.  Due to bruising, update CBC. Additional GDMT Rosuvastatin. Did not tolerate Jardiance. Beta blocker deferred due to bradycardia which is asymptomatic. Recommend aiming for 150 minutes of moderate intensity activity per week and following a heart healthy diet.     HLD with statin intolerance -prior intolerance to atorvastatin, simvastatin.  Tolerating rosuvastatin 40 mg daily. FLP/CMP today. If LDL not at goal consider PCSK9i vs Zetia.    DM2 - 02/28/23 A1c 6.9. Did not tolerate Jardiance. Continue to follow with PCP.        Dispo: follow up in 6 months  Signed, Alver Sorrow, NP

## 2023-10-24 NOTE — Patient Instructions (Addendum)
Medication Instructions:  Continue your current medications.   On 01/12/24 you may stop Clopidogrel (Plavix).   *If you need a refill on your cardiac medications before your next appointment, please call your pharmacy*  Lab Work: Your physician recommends that you return for lab work today: CMP, lipid panel, CBC  If you have labs (blood work) drawn today and your tests are completely normal, you will receive your results only by: MyChart Message (if you have MyChart) OR A paper copy in the mail If you have any lab test that is abnormal or we need to change your treatment, we will call you to review the results.  Follow-Up: At [facility], you and your health needs are our priority.  As part of our continuing mission to provide you with exceptional heart care, we have created designated Provider Care Teams.  These Care Teams include your primary Cardiologist (physician) and Advanced Practice Providers (APPs -  Physician Assistants and Nurse Practitioners) who all work together to provide you with the care you need, when you need it.  We recommend signing up for the patient portal called "MyChart".  Sign up information is provided on this After Visit Summary.  MyChart is used to connect with patients for Virtual Visits (Telemedicine).  Patients are able to view lab/test results, encounter notes, upcoming appointments, etc.  Non-urgent messages can be sent to your provider as well.   To learn more about what you can do with MyChart, go to [email or web address].    Your next appointment:   6 month(s)  Provider:   Chilton Si, MD or Gillian Shields, NP    Other Instructions  Heart Healthy Diet Recommendations: A low-salt diet is recommended. Meats should be grilled, baked, or boiled. Avoid fried foods. Focus on lean protein sources like fish or chicken with vegetables and fruits. The American Heart Association is a Chief Technology Officer!  American Heart Association Diet and  Lifeystyle Recommendations    Exercise recommendations: The American Heart Association recommends 150 minutes of moderate intensity exercise weekly. Try 30 minutes of moderate intensity exercise 4-5 times per week. This could include walking, jogging, or swimming.

## 2023-10-25 ENCOUNTER — Telehealth (HOSPITAL_BASED_OUTPATIENT_CLINIC_OR_DEPARTMENT_OTHER): Payer: Self-pay

## 2023-10-25 LAB — COMPREHENSIVE METABOLIC PANEL
ALT: 23 [IU]/L (ref 0–44)
AST: 18 [IU]/L (ref 0–40)
Albumin: 4.4 g/dL (ref 3.8–4.8)
Alkaline Phosphatase: 84 [IU]/L (ref 44–121)
BUN/Creatinine Ratio: 13 (ref 10–24)
BUN: 14 mg/dL (ref 8–27)
Bilirubin Total: 0.5 mg/dL (ref 0.0–1.2)
CO2: 21 mmol/L (ref 20–29)
Calcium: 9.4 mg/dL (ref 8.6–10.2)
Chloride: 102 mmol/L (ref 96–106)
Creatinine, Ser: 1.1 mg/dL (ref 0.76–1.27)
Globulin, Total: 2.6 g/dL (ref 1.5–4.5)
Glucose: 195 mg/dL — ABNORMAL HIGH (ref 70–99)
Potassium: 4.6 mmol/L (ref 3.5–5.2)
Sodium: 139 mmol/L (ref 134–144)
Total Protein: 7 g/dL (ref 6.0–8.5)
eGFR: 72 mL/min/{1.73_m2} (ref >59)

## 2023-10-25 LAB — LIPID PANEL
Chol/HDL Ratio: 5.4 ratio — ABNORMAL HIGH (ref 0.0–5.0)
Cholesterol, Total: 161 mg/dL (ref 100–199)
HDL: 30 mg/dL — ABNORMAL LOW (ref >39)
LDL Chol Calc (NIH): 78 mg/dL (ref 0–99)
Triglycerides: 328 mg/dL — ABNORMAL HIGH (ref 0–149)
VLDL Cholesterol Cal: 53 mg/dL — ABNORMAL HIGH (ref 5–40)

## 2023-10-25 LAB — CBC
Hematocrit: 53.8 % — ABNORMAL HIGH (ref 37.5–51.0)
Hemoglobin: 17.5 g/dL (ref 13.0–17.7)
MCH: 30.3 pg (ref 26.6–33.0)
MCHC: 32.5 g/dL (ref 31.5–35.7)
MCV: 93 fL (ref 79–97)
Platelets: 204 10*3/uL (ref 150–450)
RBC: 5.78 x10E6/uL (ref 4.14–5.80)
RDW: 13.2 % (ref 11.6–15.4)
WBC: 5.9 10*3/uL (ref 3.4–10.8)

## 2023-10-25 NOTE — Telephone Encounter (Addendum)
Left message for patient to call back   ----- Message from Alver Sorrow sent at 10/25/2023  8:01 AM EST ----- CBC with no evidence of anemia nor infection.  Normal kidneys, liver, electrolytes.   Triglycerides improved from previous but still above goal. LDL (bad cholesterol) of 78 which is improved from previous of 92 but not yet at goal of less than 70.  To lower triglycerides: reduce intake of sugars, sweets, carbohydrates, and increase physical activity.   Can either work on lifestyle changes for 3-4 months and repeat FLP/LFT at that time or add Zetia 10mg  daily with repeat FLP/LFT in 3 months.

## 2023-10-26 ENCOUNTER — Telehealth: Payer: Self-pay | Admitting: Cardiovascular Disease

## 2023-10-26 DIAGNOSIS — E785 Hyperlipidemia, unspecified: Secondary | ICD-10-CM

## 2023-10-26 DIAGNOSIS — I25118 Atherosclerotic heart disease of native coronary artery with other forms of angina pectoris: Secondary | ICD-10-CM

## 2023-10-26 NOTE — Telephone Encounter (Signed)
Pt returning nurses phone call regarding results. Please advise.

## 2023-10-26 NOTE — Telephone Encounter (Addendum)
-----   Message from Alver Sorrow sent at 10/25/2023  8:01 AM EST ----- CBC with no evidence of anemia nor infection.  Normal kidneys, liver, electrolytes.   Triglycerides improved from previous but still above goal. LDL (bad cholesterol) of 78 which is improved from previous of 92 but not yet at goal of less than 70.  To lower triglycerides: reduce intake of sugars, sweets, carbohydrates, and increase physical activity.   Can either work on lifestyle changes for 3-4 months and repeat FLP/LFT at that time or add Zetia 10mg  daily with repeat FLP/LFT in 3 months.  Called patient, was unavailable will try back

## 2023-10-26 NOTE — Telephone Encounter (Signed)
Advised patient of labs Would like to work on diet an increasing activity  Mailed lab orders

## 2023-11-04 ENCOUNTER — Telehealth: Payer: Self-pay | Admitting: Cardiovascular Disease

## 2023-11-04 DIAGNOSIS — I25118 Atherosclerotic heart disease of native coronary artery with other forms of angina pectoris: Secondary | ICD-10-CM

## 2023-11-04 DIAGNOSIS — E785 Hyperlipidemia, unspecified: Secondary | ICD-10-CM

## 2023-11-04 MED ORDER — CLOPIDOGREL BISULFATE 75 MG PO TABS
75.0000 mg | ORAL_TABLET | Freq: Every day | ORAL | 2 refills | Status: DC
Start: 2023-11-04 — End: 2024-06-19

## 2023-11-04 MED ORDER — ROSUVASTATIN CALCIUM 40 MG PO TABS: 40.0000 mg | ORAL_TABLET | Freq: Every day | ORAL | 3 refills | Status: DC

## 2023-11-04 NOTE — Telephone Encounter (Signed)
*  STAT* If patient is at the pharmacy, call can be transferred to refill team.   1. Which medications need to be refilled? (please list name of each medication and dose if known)   rosuvastatin (CRESTOR) 40 MG tablet    clopidogrel (PLAVIX) 75 MG tablet    2. Which pharmacy/location (including street and city if local pharmacy) is medication to be sent to?  [id number] - [place], [place] - [address]      3. Do they need a 30 day or 90 day supply? 90 day    Pt is out of medication

## 2023-11-07 ENCOUNTER — Telehealth: Payer: Self-pay | Admitting: Cardiology

## 2023-11-07 NOTE — Telephone Encounter (Signed)
Per last note "Recommended for DAPT for early 6 months aspirin/Plavix, will stop Plavix 01/12/24 and continue Aspirin 81mg  daily. "  Plavix refill was requested - his pharmacy may have just autosent it if he has enough Plavix to get through 01/12/24  Adrian Sorrow, NP

## 2023-11-07 NOTE — Telephone Encounter (Signed)
Left message to return call 

## 2023-11-07 NOTE — Telephone Encounter (Signed)
Called patient and discussed medication. He was confused as to why he was on Plavix 75mg . Any and all questions were answered with APP's previous note as reference. He verbalized understanding.

## 2023-11-07 NOTE — Telephone Encounter (Signed)
Patient is returning phone call.  °

## 2023-11-07 NOTE — Telephone Encounter (Signed)
Pt c/o medication issue:  1. Name of Medication: Prasugrel 10MG   2. How are you currently taking this medication (dosage and times per day)? NA  3. Are you having a reaction (difficulty breathing--STAT)? NP  4. What is your medication issue? Pt said he has been taking this medication and called in to have it refilled. He went to the pharmacy and    clopidogrel (PLAVIX) 75 MG tablet  Had been called in. He is confused as to why this has happened.

## 2024-03-16 ENCOUNTER — Other Ambulatory Visit: Payer: Self-pay | Admitting: Cardiovascular Disease

## 2024-06-19 ENCOUNTER — Ambulatory Visit (HOSPITAL_BASED_OUTPATIENT_CLINIC_OR_DEPARTMENT_OTHER): Admitting: Family

## 2024-06-19 ENCOUNTER — Encounter (HOSPITAL_BASED_OUTPATIENT_CLINIC_OR_DEPARTMENT_OTHER): Payer: Self-pay | Admitting: Family

## 2024-06-19 VITALS — BP 124/72 | HR 61 | Ht 69.0 in | Wt 191.0 lb

## 2024-06-19 DIAGNOSIS — I25118 Atherosclerotic heart disease of native coronary artery with other forms of angina pectoris: Secondary | ICD-10-CM | POA: Diagnosis not present

## 2024-06-19 DIAGNOSIS — E785 Hyperlipidemia, unspecified: Secondary | ICD-10-CM | POA: Diagnosis not present

## 2024-06-19 DIAGNOSIS — Z951 Presence of aortocoronary bypass graft: Secondary | ICD-10-CM | POA: Diagnosis not present

## 2024-06-19 DIAGNOSIS — R0683 Snoring: Secondary | ICD-10-CM | POA: Diagnosis not present

## 2024-06-19 NOTE — Progress Notes (Signed)
 Cardiology Office Note:  .   Date:  06/19/2024  ID:  Adrian Foley, DOB [birth year], MRN [id number] PCP: Candise Aleene DEL, MD  [facility] Providers Cardiologist:  Annabella Scarce, MD    History of Present Illness: .   Adrian Foley is a 73 y.o. male with history of CAD s/p CABG and subsequent PCI, DM2, HLD.   Noted 7/22 - 07/12/2023 after presenting with STEMI symptomatic with substernal chest pain.  Culprit lesion found be 100% thrombotic occlusion of SVG-RPL.  Successful PCI of SVG after extensive angioplasty, aspiration thrombectomy, stent placement.  LVEF on LHC 45%.  TTE day of discharge LVEF 55 to 60%, posterior wall and basal inferior segment hypokinetic.  Discharged on Effient  for at least 6 months, aspirin , Crestor , as needed nitroglycerin .  No beta-blocker due to bradycardia.  No ARNI due to relative hypotension but could be considered as outpatient.  Jardiance  was initiated. He tolerated Crestor  though did not previously tolerate atorvastatin , simvastatin .  He did not tolerate Jardiance  due to perception of kidneys hurting which resolved after stopping the medication.   Last seen 10/24/23 doing well from a cardiac perspective.  He was recommended follow-up in 6 months.  Presents today for follow up. 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 Reports no shortness of breath nor dyspnea on exertion. Reports no chest pain, pressure, or tightness. No edema, orthopnea, PND. Reports no palpitations.  He does note generalized fatigue despite sleeping 10 hours, wakes up tired, and does snore. He is concerned rosuvastatin  is causing his fatigue.  No prior sleep study.  ROS: Please see the history of present illness.    All other systems reviewed and are negative.   Studies Reviewed: SABRA   EKG Interpretation Date/Time:  Tuesday June 19 2024 08:36:13 EDT Ventricular Rate:  53 PR Interval:  158 QRS Duration:  96 QT Interval:  446 QTC Calculation: 418 R  Axis:   -24  Text Interpretation: Sinus bradycardia Overall flat TWI in inferior leads. nonspecific IVCD No acute ST/T wave changes. Confirmed by Vannie Mora ([id number]) on 06/19/2024 1:13:57 PM    Cardiac Studies & Procedures   ______________________________________________________________________________________________ CARDIAC CATHETERIZATION  CARDIAC CATHETERIZATION 07/11/2023  Conclusion   -------------SEVERE OCCLUSIVE 3 VESSEL NATIVE CAD---------------------------   Prox RCA to Dist RCA lesion is 100% stenosed with 100% stenosed side branch in RPAV.   Prox LAD to Mid LAD lesion is 70% stenosed - ISR   Mid Cx to Dist Cx lesion is 99% stenosed with 90% stenosed side branch in 2nd Mrg.  2nd Mrg lesion is 100% stenosed.   ---------------GRAFTS----------------------------   SVG-1st Daig graft was visualized by angiography and WAS normal in caliber. Vessel is normal; Origin to Prox Graft lesion is 100% stenosed.   SVG-OM1 prox Graft lesion is 100% stenosed. - >  Graft is not visualized, but the free LIMA-LAD comes from this hood.  LIMA-LAD graft was visualized by angiography angiographically normal, but slow flow down the LAD.   -------------------CULPRIT LESION-PCI---------------------------------   SVG-RPL graft was visualized by angiography and is normal in caliber. Origin lesion is 90% stenosed. Prox Graft lesion is 100% stenosed.  (After restoring flow: Insertion lesion at RPL is 70% stenosed. 2nd RPL lesion is 90% stenosed - obstructive antegrade flow ; unable to cross with wire)   A drug-eluting stent was successfully placed in the Ostial/Prox SVG, using a SYNERGY XD 3.0X38.  Post intervention, there is a 0% residual stenosis throughout most of the stent,  but ~20 % ISR at the ostial lesion.   -----------------HEMODYNAMICS--------------------------------------   There is mild to moderate left ventricular systolic dysfunction.  The left ventricular ejection fraction is 35-45% by visual  estimate.   LV end diastolic pressure is normal.     POST-OPERATIVE DIAGNOSIS: Severe native CAD with essentially occluded RCA, LCx and LAD with extensive stents in the LAD that are occluded. CULPRIT LESION: 100% thombotic occlusion of SVG-RPL ~Successful PCI of SVG after extensive angioplasty, penumbra aspiration thrombectomy and stent placement in the hospital proximal lesion with a 3.0 mm x 38 mm Synergy XD stent with the proximal half postdilated to 3.4 mm in the ostium postdilated to 3.6 mm. Patent Free LIMA to LAD which supposedly was sewn onto the hood of SVG-OM which is not visible.  SVG-RI also appears to be occluded. EF appears to be relatively preserved with inferior hypokinesis.  EF 45%.  LVEDP 24 mmHg.   RECOMMENDATIONS Admit to ICU for ongoing care will run 2 hours of Kengreal  followed by long-term DAPT. Check 2D echo along with lipid panel and A1c. Will use sliding scale insulin  for now and increase rosuvastatin  home dose up to 40 mg. BP is relatively low & HR in 40-50 range, therefore we will hold off on initiating beta-blocker or ARB until we see has pressures normalizing out.. Consider Coronary CTA to visualize other 2 remaining SVGs; SVG-Diag appears to be occluded, & SVG-OM was not visualized despite a patent Free LIMA sewn to the hood. Following Year 1 - would continue Maintenance dose Brilinta  60 mg BID or clopidogrel  75 mg daily to complete Year 2    Alm Clay, MD  Findings Coronary Findings Diagnostic  Dominance: Right  Left Main Vessel was injected. Vessel is normal in caliber. There is severe focal disease in the vessel.  Left Anterior Descending Prox LAD to Mid LAD lesion is 70% stenosed. The lesion was previously treated . Mid LAD lesion is 100% stenosed.  First Diagonal Branch Vessel is small in size.  Second Diagonal Branch Vessel is moderate in size.  Left Circumflex Vessel is large. Mid Cx to Dist Cx lesion is 99% stenosed with 90%  stenosed side branch in 2nd Mrg.  First Obtuse Marginal Branch Vessel is small in size.  Second Obtuse Marginal Branch Vessel is large in size. 2nd Mrg lesion is 100% stenosed.  Right Coronary Artery Vessel was injected. Vessel is normal in caliber. There is severe focal disease in the vessel. Prox RCA to Dist RCA lesion is 100% stenosed with 100% stenosed side branch in RPAV.  First Right Posterolateral Branch Vessel is small in size.  Second Right Posterolateral Branch Vessel is large in size. 2nd RPL lesion is 90% stenosed. Unable to cross lesion with wire.  Saphenous Graft To 2nd Mrg SVG graft was not visualized due to known occlusion and is normal in caliber.  Unable to visulaize beyond take-off of Free LIMA. Prox Graft lesion is 100% stenosed.  LIMA Y-graft Branch To Mid LAD LIMA graft was visualized by angiography.  Free LIMA -attached to SVG-OM1  Saphenous Graft To 1st Diag SVG graft was visualized by angiography and is normal in caliber.  SVG-Diag (RI) The graft exhibits severe diffuse disease. Flush Occluded Origin to Prox Graft lesion is 100% stenosed.  Saphenous Graft To 2nd RPL SVG graft was visualized by angiography and is normal in caliber.  SVG-RPL Origin lesion is 90% stenosed. The lesion is discrete and eccentric. The lesion is calcified. Origin to Prox Graft lesion  is 100% stenosed. Insertion lesion is 70% stenosed.  Intervention  Origin lesion (Saphenous Graft To 2nd RPL) Stent (Also treats lesions: Origin to Prox Graft) Lesion length:  26 mm. CATHETER LAUNCHER 6FR AL1 guide catheter was inserted. Lesion crossed with guidewire using a WIRE ASAHI PROWATER 180CM. Pre-stent angioplasty was performed using a BALLN EMERGE MR 2.5X20. Maximum pressure:  10 atm. Inflation time:  15 sec. Inflations were actually made up and down the entire graft A drug-eluting stent was successfully placed using a SYNERGY XD 3.0X38. After final balloon angioplasty of the more  proximal lesion, flow was finally restored.  I therefore decided to stent this area. Maximum pressure: 16 atm. Inflation time: 30 sec. Post-stent angioplasty was performed using a BALL SAPPHIRE NC24 3.5X8. Maximum pressure:  16 atm. Inflation time:  20 sec. BALLN [place] EMERGE MR 3.25X15  -proximal 1/2-1/3 of stent postdilated to 3.3 mm; tomorrow ostial segment with calcified eccentric lesion was postdilated with a 3.5 mm balloon. Stent tapered from 3.6-3.3 Post-Intervention Lesion Assessment The intervention was successful. Pre-interventional TIMI flow is 0. Post-intervention TIMI flow is 2. No-reflow occurred during the intervention. IC nitroglycerin  and verapamil  was given. Eventually TIMI-3 flow was restored but then after post dilation of the proximal portion of the stent, we are back to TIMI II flow There is a 20% residual stenosis post intervention.  Origin to Prox Graft lesion (Saphenous Graft To 2nd RPL) Thrombectomy Stent (Also treats lesions: Origin) See details in Origin lesion (Saphenous Graft To 2nd RPL). Post-Intervention Lesion Assessment The intervention was successful. Pre-interventional TIMI flow is 0. Post-intervention TIMI flow is 2. Treated lesion length:  38 mm. No-reflow occurred during the intervention. IC nitroglycerin  and verapamil  was given. There is a 0% residual stenosis post intervention.   CARDIAC CATHETERIZATION  CARDIAC CATHETERIZATION 12/23/2015  Conclusion Images from the original result were not included. 1. Ost RCA to Dist RCA lesion, 100% stenosed. Dist RCA lesion, 99% stenosed. Ost RPDA lesion, 100% stenosed. Post Atrio lesion, 90% stenosed. 2. Prox LAD to Mid LAD lesion, 95% stenosed. The lesion was previously treated with a bare metal stent greater than two years ago. Severe IN-Stent Restenosis 3. Dist LAD lesion, 100% stenosed beond D2. 2nd Diag lesion, 70% stenosed. 4. 2nd Mrg lesion, 65% stenosed. 5. The left ventricular systolic function is  normal.   Severe multivessel disease with the best option being bypass surgery. The LAD has extensive stent with diffuse in-stent restenosis and < TIMI 1 flow distally. There are some left to right collaterals filling the distal LAD which then fills the PDA with collaterals.  The RCA is totally occluded with minimal retrograde filling beyond the bifurcation. There is extensive posterolateral branch that has collaterals, but no retrograde filling of the PDA.  Plan:  Return to nursing unit or TR band removal.  CT surgical consultation has been called  Restart heparin  6 hours posterior TR band removal.  Continue nitroglycerin  infusion.  Continue risk factor modification.  Bed rest until CABG    HARDING, ALM ORN, M.D., M.S. Interventional Cardiologist  Pager # [phone]  Findings Coronary Findings Diagnostic  Dominance: Right  Left Main Vessel was injected. Vessel is large. JL3.5 CATHETER used located proximal to major branch.  Left Anterior Descending . Vessel is large. Vessel begins a very large caliber vessel, but after D2 has <TIMI 1 flow with distal vessel being filled by a left to left collaterals from the distal circumflex Diffuse eccentric ulcerativeand has left-to-left collateral flow. The lesion was previously treated  with a bare metal stent greater than two years ago. Diffuse.  Appeared to be subtotal occluded with less < TIMI 1 flow  First Septal Branch The vessel is moderate in size and is angiographically normal.  Second Diagonal Branch Tubular eccentric.  Second Septal Branch The vessel is small in size.  Third Diagonal Branch The vessel is small in size.  Third Septal Branch The vessel is small in size. Collaterals 3rd Sept filled by collaterals from 2nd Mrg.  Left Circumflex . Vessel is large.  First Obtuse Marginal Branch The vessel is small in size.  Second Obtuse Marginal Branch The vessel is large in size. Diffuse tubular  eccentric.  Lateral Second Obtuse Marginal Branch The vessel is small in size.  Third Obtuse Marginal Branch The vessel is small in size.  Right Coronary Artery . Vessel is large. Diffuse chronic total occlusion located proximal to major branch. Moderately Calcified diffuse eccentric.  Right Posterior Descending Artery The vessel is moderate in size. Collaterals RPDA filled by collaterals from Dist LAD.  Chronic total occlusion located at the major branch.  Right Posterior Atrioventricular Artery Tubular.  First Right Posterolateral Branch The vessel is small in size.  Second Right Posterolateral Branch The vessel is moderate in size. Collaterals 2nd RPL filled by collaterals from Lat 3rd Mrg.  Collaterals 2nd RPL filled by collaterals from Dist Cx.  Third Right Posterolateral Branch The vessel is small in size. Collaterals 3rd RPL filled by collaterals from Dist Cx.  Intervention  No interventions have been documented.   STRESS TESTS  MYOCARDIAL PERFUSION IMAGING 03/04/2023  Narrative   The study is normal. The study is low risk.   LV perfusion is normal. There is no evidence of ischemia. There is no evidence of infarction.   Left ventricular function is normal. End diastolic cavity size is normal. End systolic cavity size is normal.   Prior study not available for comparison.  Normal stress nuclear study with no ischemia or infarction; gated EF 60 with normal wall motion.   ECHOCARDIOGRAM  ECHOCARDIOGRAM COMPLETE 07/12/2023  Narrative ECHOCARDIOGRAM REPORT    Patient Name:   Adrian Foley Date of Exam: 07/12/2023 Medical Rec #:  [id number]     Height:       69.0 in Accession #:    [id number]    Weight:       190.0 lb Date of Birth:  [birth year]      BSA:          2.021 m Patient Age:    71 years      BP:           103/59 mmHg Patient Gender: M             HR:           42 bpm. Exam Location:  Inpatient  Procedure: 2D Echo, Cardiac Doppler and Color  Doppler  Indications:    Acute myocardial infarction, unspecified I21.9, CAD Native Vessel I25.10  History:        Patient has prior history of Echocardiogram examinations, most recent 12/21/2015. CAD, Angina and Acute MI, Prior CABG; Risk Factors:Diabetes and Dyslipidemia.  Sonographer:    Thea Norlander Referring Phys: 10 DAVID W HARDING  IMPRESSIONS   1. Left ventricular ejection fraction, by estimation, is 55 to 60%. The left ventricle has normal function. The left ventricle demonstrates regional wall motion abnormalities (see scoring diagram/findings for description). Diastolic function is indeterminate due to marked bradycardia. 2. Right ventricular systolic  function is normal. The right ventricular size is normal. Tricuspid regurgitation signal is inadequate for assessing PA pressure. 3. Left atrial size was mildly dilated. 4. The mitral valve is normal in structure. Trivial mitral valve regurgitation. No evidence of mitral stenosis. 5. The aortic valve is tricuspid. Aortic valve regurgitation is not visualized. No aortic stenosis is present. 6. There is borderline dilatation of the ascending aorta, measuring 39 mm.  Comparison(s): Prior images reviewed side by side.  FINDINGS Left Ventricle: Left ventricular ejection fraction, by estimation, is 55 to 60%. The left ventricle has normal function. The left ventricle demonstrates regional wall motion abnormalities. The left ventricular internal cavity size was normal in size. There is no left ventricular hypertrophy. Diastolic function is indeterminate due to marked bradycardia.   LV Wall Scoring: The posterior wall and basal inferior segment are hypokinetic.  Right Ventricle: The right ventricular size is normal. No increase in right ventricular wall thickness. Right ventricular systolic function is normal. Tricuspid regurgitation signal is inadequate for assessing PA pressure.  Left Atrium: Left atrial size was mildly  dilated.  Right Atrium: Right atrial size was normal in size.  Pericardium: There is no evidence of pericardial effusion.  Mitral Valve: The mitral valve is normal in structure. Trivial mitral valve regurgitation. No evidence of mitral valve stenosis.  Tricuspid Valve: The tricuspid valve is normal in structure. Tricuspid valve regurgitation is trivial.  Aortic Valve: The aortic valve is tricuspid. Aortic valve regurgitation is not visualized. No aortic stenosis is present. Aortic valve peak gradient measures 4.3 mmHg.  Pulmonic Valve: The pulmonic valve was normal in structure. Pulmonic valve regurgitation is not visualized. No evidence of pulmonic stenosis.  Aorta: The aortic root is normal in size and structure. There is borderline dilatation of the ascending aorta, measuring 39 mm.  Venous: The inferior vena cava was not well visualized.  IAS/Shunts: No atrial level shunt detected by color flow Doppler.   LEFT VENTRICLE PLAX 2D LVIDd:         4.20 cm   Diastology LVIDs:         3.00 cm   LV e' medial:    6.83 cm/s LV PW:         1.20 cm   LV E/e' medial:  13.3 LV IVS:        0.90 cm   LV e' lateral:   9.55 cm/s LVOT diam:     2.10 cm   LV E/e' lateral: 9.5 LV SV:         61 LV SV Index:   30 LVOT Area:     3.46 cm   RIGHT VENTRICLE RV S prime:     8.27 cm/s TAPSE (M-mode): 1.3 cm  LEFT ATRIUM             Index        RIGHT ATRIUM           Index LA diam:        3.90 cm 1.93 cm/m   RA Area:     15.80 cm LA Vol (A2C):   48.8 ml 24.14 ml/m  RA Volume:   31.70 ml  15.68 ml/m LA Vol (A4C):   45.6 ml 22.56 ml/m LA Biplane Vol: 49.7 ml 24.59 ml/m AORTIC VALVE                 PULMONIC VALVE AV Area (Vmax): 2.76 cm     PR End Diast Vel: 5.90 msec AV Vmax:  104.00 cm/s AV Peak Grad:   4.3 mmHg LVOT Vmax:      82.83 cm/s LVOT Vmean:     49.800 cm/s LVOT VTI:       0.175 m  AORTA Ao Root diam: 3.60 cm Ao Asc diam:  3.90 cm  MITRAL VALVE MV Area (PHT): 4.80  cm    SHUNTS MV Decel Time: 158 msec    Systemic VTI:  0.18 m MR Peak grad: 14.4 mmHg    Systemic Diam: 2.10 cm MR Vmax:      190.00 cm/s MV E velocity: 90.60 cm/s MV A velocity: 52.00 cm/s MV E/A ratio:  1.74  Mihai Croitoru MD Electronically signed by Jerel Balding MD Signature Date/Time: 07/12/2023/12:36:44 PM    Final          ______________________________________________________________________________________________      Risk Assessment/Calculations:         STOP-Bang Score:  4      Physical Exam:   VS:  BP 124/72 (BP Location: Right Arm, Patient Position: Sitting, Cuff Size: Normal)   Pulse 61   Ht 5' 9 (1.753 m)   Wt 191 lb (86.6 kg)   SpO2 96%   BMI 28.21 kg/m    Wt Readings from Last 3 Encounters:  06/19/24 191 lb (86.6 kg)  10/24/23 191 lb 12.8 oz (87 kg)  07/21/23 192 lb 1.6 oz (87.1 kg)    GEN: Well nourished, well developed in no acute distress NECK: No JVD; No carotid bruits CARDIAC: RRR, no murmurs, rubs, gallops RESPIRATORY:  Clear to auscultation without rales, wheezing or rhonchi  ABDOMEN: Soft, non-tender, non-distended EXTREMITIES:  No edema; No deformity   ASSESSMENT AND PLAN: .    CAD s/p CABG with subsequent STEMI 07/11/23 DES-SVB-posterior lateral branch - Stable with no anginal symptoms. GDMT aspirin  81 mg daily, rosuvastatin  40mg  daily. Did not tolerate Jardiance . Beta blocker deferred due to bradycardia which is asymptomatic. Recommend aiming for 150 minutes of moderate intensity activity per week and following a heart healthy diet.     HLD with statin intolerance -prior intolerance to atorvastatin , simvastatin . FLP/CMETo today.  He was rosuvastatin  40 mg daily though now reports fatigue. Symptoms concerning for sleep apnea, as below. Given his concern, plan for 2-week statin holiday. Phone call in 2 weeks. If fatigue improved, adjust lipid regimen. If not improved, resume Rosuvastatin  and plan for sleep study.  If LDL not at goal  consider ERDX0p vs Bempedoic acid. He prefers oral agent and would require [employer] as he does not have prescription drug coverage.    Snores/sleep disordered breathing - STOPBang 4. Notes snoring, daytime somnolence despite adequate sleep. He is concerned related to Rosuvastatin , low suspicion this is side effect of Rosuvastatin . Per his preference plan for 2-week statin holiday. Phone call in 2 weeks. If fatigue improved, adjust lipid regimen. If not improved, resume Rosuvastatin  and plan for sleep study. If sleep study needed would prefer home sleep study (not [facility])   DM2 - Did not tolerate Jardiance . Continue to follow with PCP.        Dispo: follow up in 6 months  Signed, Reche GORMAN Finder, NP

## 2024-06-19 NOTE — Patient Instructions (Signed)
 Medication Instructions:  HOLD YOUR ROSUVASTATIN  FOR 2 WEEKS, WILL REACH OUT TO SEE HOW YOU ARE FEELING   *If you need a refill on your cardiac medications before your next appointment, please call your pharmacy*  Lab Work: LP/CMET TODAY   If you have labs (blood work) drawn today and your tests are completely normal, you will receive your results only by: MyChart Message (if you have MyChart) OR A paper copy in the mail If you have any lab test that is abnormal or we need to change your treatment, we will call you to review the results.  Testing/Procedures: NONE  Follow-Up: At [facility], you and your health needs are our priority.  As part of our continuing mission to provide you with exceptional heart care, our providers are all part of one team.  This team includes your primary Cardiologist (physician) and Advanced Practice Providers or APPs (Physician Assistants and Nurse Practitioners) who all work together to provide you with the care you need, when you need it.  Your next appointment:   6 month(s)  Provider:   Reche Finder, NP   We recommend signing up for the patient portal called MyChart.  Sign up information is provided on this After Visit Summary.  MyChart is used to connect with patients for Virtual Visits (Telemedicine).  Patients are able to view lab/test results, encounter notes, upcoming appointments, etc.  Non-urgent messages can be sent to your provider as well.   To learn more about what you can do with MyChart, go to [email or web address].

## 2024-06-20 ENCOUNTER — Ambulatory Visit (HOSPITAL_BASED_OUTPATIENT_CLINIC_OR_DEPARTMENT_OTHER): Payer: Self-pay | Admitting: Family

## 2024-06-20 LAB — COMPREHENSIVE METABOLIC PANEL WITH GFR
ALT: 29 IU/L (ref 0–44)
AST: 22 IU/L (ref 0–40)
Albumin: 4.2 g/dL (ref 3.8–4.8)
Alkaline Phosphatase: 93 IU/L (ref 44–121)
BUN/Creatinine Ratio: 15 (ref 10–24)
BUN: 16 mg/dL (ref 8–27)
Bilirubin Total: 0.7 mg/dL (ref 0.0–1.2)
CO2: 20 mmol/L (ref 20–29)
Calcium: 9.4 mg/dL (ref 8.6–10.2)
Chloride: 100 mmol/L (ref 96–106)
Creatinine, Ser: 1.08 mg/dL (ref 0.76–1.27)
Globulin, Total: 2.5 g/dL (ref 1.5–4.5)
Glucose: 307 mg/dL — ABNORMAL HIGH (ref 70–99)
Potassium: 4.6 mmol/L (ref 3.5–5.2)
Sodium: 136 mmol/L (ref 134–144)
Total Protein: 6.7 g/dL (ref 6.0–8.5)
eGFR: 73 mL/min/{1.73_m2} (ref >59)

## 2024-06-20 LAB — LIPID PANEL
Chol/HDL Ratio: 5.6 ratio — ABNORMAL HIGH (ref 0.0–5.0)
Cholesterol, Total: 169 mg/dL (ref 100–199)
HDL: 30 mg/dL — ABNORMAL LOW (ref >39)
LDL Chol Calc (NIH): 66 mg/dL (ref 0–99)
Triglycerides: 470 mg/dL — ABNORMAL HIGH (ref 0–149)
VLDL Cholesterol Cal: 73 mg/dL — ABNORMAL HIGH (ref 5–40)

## 2024-06-20 MED ORDER — FISH OIL 1000 MG PO CAPS: 2000.0000 mg | ORAL_CAPSULE | Freq: Two times a day (BID) | ORAL | Status: AC

## 2024-08-22 ENCOUNTER — Ambulatory Visit (INDEPENDENT_AMBULATORY_CARE_PROVIDER_SITE_OTHER): Admitting: Family Medicine

## 2024-08-22 ENCOUNTER — Encounter: Payer: Self-pay | Admitting: Family Medicine

## 2024-08-22 VITALS — BP 132/74 | HR 55 | Temp 97.3°F | Wt 192.4 lb

## 2024-08-22 DIAGNOSIS — E119 Type 2 diabetes mellitus without complications: Secondary | ICD-10-CM | POA: Diagnosis not present

## 2024-08-22 DIAGNOSIS — B3742 Candidal balanitis: Secondary | ICD-10-CM

## 2024-08-22 DIAGNOSIS — K219 Gastro-esophageal reflux disease without esophagitis: Secondary | ICD-10-CM

## 2024-08-22 DIAGNOSIS — Z794 Long term (current) use of insulin: Secondary | ICD-10-CM | POA: Diagnosis not present

## 2024-08-22 LAB — POCT GLYCOSYLATED HEMOGLOBIN (HGB A1C)
HbA1c POC (<> result, manual entry): 12.6 % (ref 4.0–5.6)
HbA1c, POC (controlled diabetic range): 12.6 % — AB (ref 0.0–7.0)
HbA1c, POC (prediabetic range): 12.6 % — AB (ref 5.7–6.4)
Hemoglobin A1C: 12.6 % — AB (ref 4.0–5.6)

## 2024-08-22 MED ORDER — NYSTATIN 100000 UNIT/GM EX CREA: TOPICAL_CREAM | CUTANEOUS | 1 refills | Status: DC

## 2024-08-22 MED ORDER — PANTOPRAZOLE SODIUM 40 MG PO TBEC: 40.0000 mg | DELAYED_RELEASE_TABLET | Freq: Every day | ORAL | 3 refills | Status: DC

## 2024-08-22 MED ORDER — NOVOLIN 70/30 FLEXPEN (70-30) 100 UNIT/ML ~~LOC~~ SUPN: PEN_INJECTOR | SUBCUTANEOUS | 3 refills | Status: DC

## 2024-08-22 NOTE — Progress Notes (Signed)
 OFFICE VISIT  08/22/2024  CC:  Chief Complaint  Patient presents with   Rash    Located on genital area x 2 weeks; tried benadryl  , calamine lotion, peroxide.    Patient is a 73 y.o. male who presents for rash.  HPI: Itchy rash on penis for a couple of weeks. Has tried calamine and Benadryl  cream and these gives some short-term relief of the itching. No rash anywhere else on his body.  No thrush.  I last saw him on April 19, 2023 for uncontrolled diabetes.  He does have some rising burning sensation in his chest and occasionally will vomit.  There are some culprit foods.  ROS as above, plus--> no fevers, no CP, no SOB, no wheezing, no cough, no dizziness, no HAs, no rashes, no melena/hematochezia.   No myalgias or arthralgias.  No focal weakness, paresthesias, or tremors.  No acute vision or hearing abnormalities.  No dysuria or unusual/new urinary urgency or frequency.  No recent changes in lower legs. No abdominal pain.  No palpitations.    Past Medical History:  Diagnosis Date   CAD (coronary artery disease)    CABG 2017   Diabetes mellitus without complication (HCC)    type 2 --metformin  stopped 04/2016 due to taste side effect   Kidney stones    Mixed hyperlipidemia    NSTEMI (non-ST elevated myocardial infarction) (HCC)    Seizures (HCC)    post-traumatic (pediatric) initially, then was medicated for a few years, then was weened off med and has been seizure-free since.    Past Surgical History:  Procedure Laterality Date   CARDIAC CATHETERIZATION N/A 12/23/2015   Procedure: Left Heart Cath and Coronary Angiography;  Surgeon: Alm LELON Clay, MD;  Location: [facility] INVASIVE CV LAB;  Service: Cardiovascular;  Laterality: N/A;  any available cath attending   CORONARY ANGIOPLASTY WITH STENT PLACEMENT     approx age 84 per pt report   CORONARY ARTERY BYPASS GRAFT N/A 12/29/2015   Procedure: CORONARY ARTERY BYPASS GRAFTING (CABG) x four,  using left internal mammary artery and right  leg greater saphenous vein harvested endoscopically;  Surgeon: Maude Fleeta Ochoa, MD;  Location: [facility] OR;  Service: Open Heart Surgery;  Laterality: N/A;   CORONARY THROMBECTOMY N/A 07/11/2023   Procedure: Coronary Thrombectomy;  Surgeon: Clay Alm LELON, MD;  Location: [facility] INVASIVE CV LAB;  Service: Cardiovascular;  Laterality: N/A;   CORONARY/GRAFT ACUTE MI REVASCULARIZATION N/A 07/11/2023   Procedure: Coronary/Graft Acute MI Revascularization;  Surgeon: Clay Alm LELON, MD;  Location: [facility] INVASIVE CV LAB;  Service: Cardiovascular;  Laterality: N/A;   INGUINAL HERNIA REPAIR Right 07/19/2018   Procedure: RIGHT HERNIA REPAIR INCARCERATED INGUINAL ADULT;  Surgeon: Teresa Lonni HERO, MD;  Location: [facility] OR;  Service: General;  Laterality: Right;   INSERTION OF MESH Right 07/19/2018   Procedure: INSERTION OF MESH;  Surgeon: Teresa Lonni HERO, MD;  Location: [facility] OR;  Service: General;  Laterality: Right;   LEFT HEART CATH AND CORS/GRAFTS ANGIOGRAPHY N/A 07/11/2023   Procedure: LEFT HEART CATH AND CORS/GRAFTS ANGIOGRAPHY;  Surgeon: Clay Alm LELON, MD;  Location: [facility] INVASIVE CV LAB;  Service: Cardiovascular;  Laterality: N/A;   TEE WITHOUT CARDIOVERSION N/A 12/29/2015   Procedure: TRANSESOPHAGEAL ECHOCARDIOGRAM (TEE);  Surgeon: Maude Fleeta Ochoa, MD;  Location: [facility] OR;  Service: Open Heart Surgery;  Laterality: N/A;    Outpatient Medications Prior to Visit  Medication Sig Dispense Refill   aspirin  81 MG tablet Take 81 mg by mouth daily.  nitroGLYCERIN  (NITROSTAT ) 0.4 MG SL tablet Place 1 tablet (0.4 mg total) under the tongue every 5 (five) minutes as needed for chest pain. 15 tablet 1   Omega-3 Fatty Acids (FISH OIL ) 1000 MG CAPS Take 2 capsules (2,000 mg total) by mouth in the morning and at bedtime.     rosuvastatin  (CRESTOR ) 40 MG tablet TAKE 1 TABLET BY MOUTH EVERY DAY 90 tablet 2   No facility-administered medications prior to visit.    Allergies  Allergen Reactions   Atorvastatin  Other (See  Comments)    myalgias   Simvastatin  Other (See Comments)    Myalgias/arthralgias    Review of Systems  As per HPI  PE:    08/22/2024    3:50 PM 06/19/2024    7:59 AM 10/24/2023    9:23 AM  Vitals with BMI  Height  5' 9 5' 9  Weight 192 lbs 6 oz 191 lbs 191 lbs 13 oz  BMI  28.19 28.31  Systolic 132 124 877  Diastolic 74 72 70  Pulse 55 61      Physical Exam  Gen: Alert, well appearing.  Patient is oriented to person, place, time, and situation. AFFECT: pleasant, lucid thought and speech. No thrush Penile skin shows erythematous maculopapular rash with a little bit of desquamation.  The rash mostly is in the area over and around the glans and there are a few satellite lesions on the shaft.  there are some foreskin adhesions to the glans.  LABS:  Last CBC Lab Results  Component Value Date   WBC 5.9 10/24/2023   HGB 17.5 10/24/2023   HCT 53.8 (H) 10/24/2023   MCV 93 10/24/2023   MCH 30.3 10/24/2023   RDW 13.2 10/24/2023   PLT 204 10/24/2023   Lab Results  Component Value Date   IRON 101 03/01/2023   TIBC 341 03/01/2023   FERRITIN 139 03/01/2023   Last metabolic panel Lab Results  Component Value Date   GLUCOSE 307 (H) 06/19/2024   NA 136 06/19/2024   K 4.6 06/19/2024   CL 100 06/19/2024   CO2 20 06/19/2024   BUN 16 06/19/2024   CREATININE 1.08 06/19/2024   EGFR 73 06/19/2024   CALCIUM  9.4 06/19/2024   PROT 6.7 06/19/2024   ALBUMIN  4.2 06/19/2024   LABGLOB 2.5 06/19/2024   BILITOT 0.7 06/19/2024   ALKPHOS 93 06/19/2024   AST 22 06/19/2024   ALT 29 06/19/2024   ANIONGAP 10 07/12/2023   Last hemoglobin A1c Lab Results  Component Value Date   HGBA1C 12.6 (A) 08/22/2024   HGBA1C 12.6 08/22/2024   HGBA1C 12.6 (A) 08/22/2024   HGBA1C 12.6 (A) 08/22/2024   IMPRESSION AND PLAN:  #1 candidal balanitis in the setting of poorly controlled diabetes. Nystatin  cream, apply to affected area 4 times daily x 3 weeks.  #2 poorly controlled  diabetes. Point-of-care hemoglobin A1c today is 12.6%. Start Novolin  70/30, 20 units in the morning and 10 units in the evening. Discussed some basic dietary changes for him to start. Basic metabolic panel today. CGM ordered. We will review these in about 10 days.  GERD. Discussed avoidance of triggers. Pantoprazole  40 mg/day started today.  An After Visit Summary was printed and given to the patient.  FOLLOW UP: Return in about 10 days (around 09/01/2024) for f/u DM.  Signed:  Gerlene Hockey, MD           08/22/2024

## 2024-08-23 ENCOUNTER — Encounter: Payer: Self-pay | Admitting: Family Medicine

## 2024-08-23 LAB — BASIC METABOLIC PANEL WITH GFR
BUN: 17 mg/dL (ref 6–23)
CO2: 26 meq/L (ref 19–32)
Calcium: 9.1 mg/dL (ref 8.4–10.5)
Chloride: 99 meq/L (ref 96–112)
Creatinine, Ser: 1.24 mg/dL (ref 0.40–1.50)
GFR: 58.29 mL/min — ABNORMAL LOW (ref >60.00)
Glucose, Bld: 413 mg/dL — ABNORMAL HIGH (ref 70–99)
Potassium: 4.3 meq/L (ref 3.5–5.1)
Sodium: 135 meq/L (ref 135–145)

## 2024-08-31 ENCOUNTER — Ambulatory Visit: Admitting: Family Medicine

## 2024-09-07 ENCOUNTER — Ambulatory Visit: Admitting: Family Medicine

## 2024-09-07 ENCOUNTER — Encounter: Payer: Self-pay | Admitting: Family Medicine

## 2024-09-07 VITALS — BP 126/74 | HR 57 | Temp 98.2°F | Ht 69.0 in | Wt 186.6 lb

## 2024-09-07 DIAGNOSIS — E1121 Type 2 diabetes mellitus with diabetic nephropathy: Secondary | ICD-10-CM

## 2024-09-07 NOTE — Progress Notes (Signed)
 OFFICE VISIT  09/07/2024  CC:  Chief Complaint  Patient presents with   Medical Management of Chronic Issues    Patient is a 73 y.o. male who presents for 2-week follow-up diabetes. A/P as of last visit: #1 candidal balanitis in the setting of poorly controlled diabetes. Nystatin  cream, apply to affected area 4 times daily x 3 weeks.   #2 poorly controlled diabetes. Point-of-care hemoglobin A1c today is 12.6%. Start Novolin  70/30, 20 units in the morning and 10 units in the evening. Discussed some basic dietary changes for him to start. Basic metabolic panel today. CGM ordered. We will review these in about 10 days.   #3 GERD. Discussed avoidance of triggers. Pantoprazole  40 mg/day started today.  INTERIM HX: Labs last visit showed glucose of 307.  His serum creatinine was 1.24 (GFR 58), a bit worse than his baseline.  He chose to not get on the insulin .  He has been working on eliminating a lot of the sugars and starches from his diet. He has been checking his glucose some and says today his level at home was 254.  Past Medical History:  Diagnosis Date   CAD (coronary artery disease)    CABG 2017   Chronic renal insufficiency, stage 3 (moderate) (HCC)    borderline II/III   Diabetes mellitus without complication (HCC)    type 2 --metformin  stopped 04/2016 due to taste side effect   Kidney stones    Mixed hyperlipidemia    NSTEMI (non-ST elevated myocardial infarction) (HCC)    Seizures (HCC)    post-traumatic (pediatric) initially, then was medicated for a few years, then was weened off med and has been seizure-free since.   STEMI (ST elevation myocardial infarction) (HCC)    06/2023, angioplasty, stent    Past Surgical History:  Procedure Laterality Date   CARDIAC CATHETERIZATION N/A 12/23/2015   Procedure: Left Heart Cath and Coronary Angiography;  Surgeon: Alm LELON Clay, MD;  Location: [facility] INVASIVE CV LAB;  Service: Cardiovascular;  Laterality: N/A;  any  available cath attending   CORONARY ANGIOPLASTY WITH STENT PLACEMENT     approx age 39 per pt report   CORONARY ARTERY BYPASS GRAFT N/A 12/29/2015   Procedure: CORONARY ARTERY BYPASS GRAFTING (CABG) x four,  using left internal mammary artery and right leg greater saphenous vein harvested endoscopically;  Surgeon: Maude Fleeta Ochoa, MD;  Location: [facility] OR;  Service: Open Heart Surgery;  Laterality: N/A;   CORONARY THROMBECTOMY N/A 07/11/2023   Procedure: Coronary Thrombectomy;  Surgeon: Clay Alm LELON, MD;  Location: [facility] INVASIVE CV LAB;  Service: Cardiovascular;  Laterality: N/A;   CORONARY/GRAFT ACUTE MI REVASCULARIZATION N/A 07/11/2023   Procedure: Coronary/Graft Acute MI Revascularization;  Surgeon: Clay Alm LELON, MD;  Location: [facility] INVASIVE CV LAB;  Service: Cardiovascular;  Laterality: N/A;   INGUINAL HERNIA REPAIR Right 07/19/2018   Procedure: RIGHT HERNIA REPAIR INCARCERATED INGUINAL ADULT;  Surgeon: Teresa Lonni HERO, MD;  Location: [facility] OR;  Service: General;  Laterality: Right;   INSERTION OF MESH Right 07/19/2018   Procedure: INSERTION OF MESH;  Surgeon: Teresa Lonni HERO, MD;  Location: [facility] OR;  Service: General;  Laterality: Right;   LEFT HEART CATH AND CORS/GRAFTS ANGIOGRAPHY N/A 07/11/2023   Procedure: LEFT HEART CATH AND CORS/GRAFTS ANGIOGRAPHY;  Surgeon: Clay Alm LELON, MD;  Location: [facility] INVASIVE CV LAB;  Service: Cardiovascular;  Laterality: N/A;   TEE WITHOUT CARDIOVERSION N/A 12/29/2015   Procedure: TRANSESOPHAGEAL ECHOCARDIOGRAM (TEE);  Surgeon: Maude Fleeta Ochoa, MD;  Location: [facility]  OR;  Service: Open Heart Surgery;  Laterality: N/A;    Outpatient Medications Prior to Visit  Medication Sig Dispense Refill   aspirin  81 MG tablet Take 81 mg by mouth daily.     nitroGLYCERIN  (NITROSTAT ) 0.4 MG SL tablet Place 1 tablet (0.4 mg total) under the tongue every 5 (five) minutes as needed for chest pain. 15 tablet 1   nystatin  cream (MYCOSTATIN ) Apply to affected area qid x 3 weeks 30 g 1    Omega-3 Fatty Acids (FISH OIL ) 1000 MG CAPS Take 2 capsules (2,000 mg total) by mouth in the morning and at bedtime.     pantoprazole  (PROTONIX ) 40 MG tablet Take 1 tablet (40 mg total) by mouth daily. 30 tablet 3   rosuvastatin  (CRESTOR ) 40 MG tablet TAKE 1 TABLET BY MOUTH EVERY DAY 90 tablet 2   insulin  isophane & regular human KwikPen (NOVOLIN  70/30 KWIKPEN) (70-30) 100 UNIT/ML KwikPen 20 Units SQ qAM and 10 Units SQ at supper (Patient not taking: Reported on 09/07/2024) 15 mL 3   No facility-administered medications prior to visit.    Allergies  Allergen Reactions   Atorvastatin  Other (See Comments)    myalgias   Simvastatin  Other (See Comments)    Myalgias/arthralgias    Review of Systems As per HPI  PE:    09/07/2024    1:07 PM 08/22/2024    3:50 PM 06/19/2024    7:59 AM  Vitals with BMI  Height 5' 9  5' 9  Weight 186 lbs 9 oz 192 lbs 6 oz 191 lbs  BMI 27.54  28.19  Systolic 126 132 875  Diastolic 74 74 72  Pulse 57 55 61     Physical Exam  Gen: Alert, well appearing.  Patient is oriented to person, place, time, and situation. AFFECT: pleasant, lucid thought and speech. No further exam today  LABS:  Last CBC Lab Results  Component Value Date   WBC 5.9 10/24/2023   HGB 17.5 10/24/2023   HCT 53.8 (H) 10/24/2023   MCV 93 10/24/2023   MCH 30.3 10/24/2023   RDW 13.2 10/24/2023   PLT 204 10/24/2023   Last metabolic panel Lab Results  Component Value Date   GLUCOSE 413 (H) 08/22/2024   NA 135 08/22/2024   K 4.3 08/22/2024   CL 99 08/22/2024   CO2 26 08/22/2024   BUN 17 08/22/2024   CREATININE 1.24 08/22/2024   GFR 58.29 (L) 08/22/2024   CALCIUM  9.1 08/22/2024   PROT 6.7 06/19/2024   ALBUMIN  4.2 06/19/2024   LABGLOB 2.5 06/19/2024   BILITOT 0.7 06/19/2024   ALKPHOS 93 06/19/2024   AST 22 06/19/2024   ALT 29 06/19/2024   ANIONGAP 10 07/12/2023   Last lipids Lab Results  Component Value Date   CHOL 169 06/19/2024   HDL 30 (L) 06/19/2024    LDLCALC 66 06/19/2024   LDLDIRECT 92 07/12/2023   TRIG 470 (H) 06/19/2024   CHOLHDL 5.6 (H) 06/19/2024   Last hemoglobin A1c Lab Results  Component Value Date   HGBA1C 12.6 (A) 08/22/2024   HGBA1C 12.6 08/22/2024   HGBA1C 12.6 (A) 08/22/2024   HGBA1C 12.6 (A) 08/22/2024   Last thyroid  functions Lab Results  Component Value Date   TSH 4.11 02/28/2023   IMPRESSION AND PLAN:  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 He chooses not get on insulin . He wants to work on diet alone. Discussed goal fasting glucose of 100 or less and 2-hour postprandial of 140-150.  An After  Visit Summary was printed and given to the patient.  FOLLOW UP: No follow-ups on file.  Signed:  Gerlene Hockey, MD           09/07/2024

## 2024-12-07 ENCOUNTER — Ambulatory Visit (INDEPENDENT_AMBULATORY_CARE_PROVIDER_SITE_OTHER): Admitting: Family Medicine

## 2024-12-07 ENCOUNTER — Encounter: Payer: Self-pay | Admitting: Family Medicine

## 2024-12-07 VITALS — BP 130/75 | HR 56 | Temp 98.3°F | Ht 69.0 in | Wt 187.6 lb

## 2024-12-07 DIAGNOSIS — E1121 Type 2 diabetes mellitus with diabetic nephropathy: Secondary | ICD-10-CM | POA: Diagnosis not present

## 2024-12-07 DIAGNOSIS — E782 Mixed hyperlipidemia: Secondary | ICD-10-CM | POA: Diagnosis not present

## 2024-12-07 DIAGNOSIS — N2889 Other specified disorders of kidney and ureter: Secondary | ICD-10-CM

## 2024-12-07 DIAGNOSIS — Z794 Long term (current) use of insulin: Secondary | ICD-10-CM

## 2024-12-07 LAB — POCT GLYCOSYLATED HEMOGLOBIN (HGB A1C)
HbA1c POC (<> result, manual entry): 10 %
HbA1c, POC (controlled diabetic range): 10 % — AB (ref 0.0–7.0)
HbA1c, POC (prediabetic range): 10 % — AB (ref 5.7–6.4)
Hemoglobin A1C: 10 % — AB (ref 4.0–5.6)

## 2024-12-07 MED ORDER — NOVOLIN 70/30 FLEXPEN (70-30) 100 UNIT/ML ~~LOC~~ SUPN: PEN_INJECTOR | SUBCUTANEOUS | 3 refills | Status: DC

## 2024-12-07 NOTE — Progress Notes (Signed)
 OFFICE VISIT  12/07/2024  CC:  Chief Complaint  Patient presents with   Medical Management of Chronic Issues    Patient is a 73 y.o. 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 He chooses not get on insulin . He wants to work on diet alone. Discussed goal fasting glucose of 100 or less and 2-hour postprandial of 140-150.  INTERIM HX: Adrian Foley feels well. He has not adhered to a diabetic diet.  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  He checks his glucose every 3 to 4 days on average.  Says it is around the 220 mark lately.  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 declined insulin  08/2024   Kidney stones    Mixed hyperlipidemia    NSTEMI (non-ST elevated myocardial infarction) (HCC)    Seizures (HCC)    post-traumatic (pediatric) initially, then was medicated for a few years, then was weened off med and has been seizure-free since.   STEMI (ST elevation myocardial infarction) (HCC)    06/2023, angioplasty, stent    Past Surgical History:  Procedure Laterality Date   CARDIAC CATHETERIZATION N/A 12/23/2015   Procedure: Left Heart Cath and Coronary Angiography;  Surgeon: Alm LELON Clay, MD;  Location: [facility] INVASIVE CV LAB;  Service: Cardiovascular;  Laterality: N/A;  any available cath attending   CORONARY ANGIOPLASTY WITH STENT PLACEMENT     approx age 27 per pt report   CORONARY ARTERY BYPASS GRAFT N/A 12/29/2015   Procedure: CORONARY ARTERY BYPASS GRAFTING (CABG) x four,  using left internal mammary artery and right leg greater saphenous vein harvested endoscopically;  Surgeon: Maude Fleeta Ochoa, MD;  Location: [facility] OR;  Service: Open Heart  Surgery;  Laterality: N/A;   CORONARY THROMBECTOMY N/A 07/11/2023   Procedure: Coronary Thrombectomy;  Surgeon: Clay Alm LELON, MD;  Location: [facility] INVASIVE CV LAB;  Service: Cardiovascular;  Laterality: N/A;   CORONARY/GRAFT ACUTE MI REVASCULARIZATION N/A 07/11/2023   Procedure: Coronary/Graft Acute MI Revascularization;  Surgeon: Clay Alm LELON, MD;  Location: [facility] INVASIVE CV LAB;  Service: Cardiovascular;  Laterality: N/A;   INGUINAL HERNIA REPAIR Right 07/19/2018   Procedure: RIGHT HERNIA REPAIR INCARCERATED INGUINAL ADULT;  Surgeon: Teresa Lonni HERO, MD;  Location: [facility] OR;  Service: General;  Laterality: Right;   INSERTION OF MESH Right 07/19/2018   Procedure: INSERTION OF MESH;  Surgeon: Teresa Lonni HERO, MD;  Location: [facility] OR;  Service: General;  Laterality: Right;   LEFT HEART CATH AND CORS/GRAFTS ANGIOGRAPHY N/A 07/11/2023   Procedure: LEFT HEART CATH AND CORS/GRAFTS ANGIOGRAPHY;  Surgeon: Clay Alm LELON, MD;  Location: [facility] INVASIVE CV LAB;  Service: Cardiovascular;  Laterality: N/A;   TEE WITHOUT CARDIOVERSION N/A 12/29/2015   Procedure: TRANSESOPHAGEAL ECHOCARDIOGRAM (TEE);  Surgeon: Maude Fleeta Ochoa, MD;  Location: [facility] OR;  Service: Open Heart Surgery;  Laterality: N/A;    Outpatient Medications Prior to Visit  Medication Sig Dispense Refill   aspirin  81 MG tablet Take 81 mg by mouth daily.     nitroGLYCERIN  (NITROSTAT ) 0.4 MG SL tablet Place 1 tablet (0.4 mg total) under the tongue every 5 (five) minutes as needed for chest pain. 15 tablet 1  Omega-3 Fatty Acids (FISH OIL ) 1000 MG CAPS Take 2 capsules (2,000 mg total) by mouth in the morning and at bedtime.     rosuvastatin  (CRESTOR ) 40 MG tablet TAKE 1 TABLET BY MOUTH EVERY DAY 90 tablet 2   pantoprazole  (PROTONIX ) 40 MG tablet Take 1 tablet (40 mg total) by mouth daily. (Patient not taking: Reported on 12/07/2024) 30 tablet 3   insulin  isophane & regular human KwikPen (NOVOLIN  70/30 KWIKPEN) (70-30) 100 UNIT/ML KwikPen 20 Units  SQ qAM and 10 Units SQ at supper (Patient not taking: Reported on 12/07/2024) 15 mL 3   nystatin  cream (MYCOSTATIN ) Apply to affected area qid x 3 weeks (Patient not taking: Reported on 12/07/2024) 30 g 1   No facility-administered medications prior to visit.    Allergies[1]  Review of Systems As per HPI  PE:    12/07/2024    2:37 PM 09/07/2024    1:07 PM 08/22/2024    3:50 PM  Vitals with BMI  Height 5' 9 5' 9   Weight 187 lbs 10 oz 186 lbs 9 oz 192 lbs 6 oz  BMI 27.69 27.54   Systolic 130 126 867  Diastolic 75 74 74  Pulse 56 57 55     Physical Exam  Gen: Alert, well appearing.  Patient is oriented to person, place, time, and situation. AFFECT: pleasant, lucid thought and speech. CV: RRR, no m/r/g.   LUNGS: CTA bilat, nonlabored resps, good aeration in all lung fields. EXT: no edema Foot exam -> no swelling, tenderness or skin or vascular lesions. Color and temperature is normal. Sensation is intact. Peripheral pulses are palpable. Toenails are normal.  LABS:  Last CBC Lab Results  Component Value Date   WBC 5.9 10/24/2023   HGB 17.5 10/24/2023   HCT 53.8 (H) 10/24/2023   MCV 93 10/24/2023   MCH 30.3 10/24/2023   RDW 13.2 10/24/2023   PLT 204 10/24/2023   Lab Results  Component Value Date   IRON 101 03/01/2023   TIBC 341 03/01/2023   FERRITIN 139 03/01/2023   Last metabolic panel Lab Results  Component Value Date   GLUCOSE 413 (H) 08/22/2024   NA 135 08/22/2024   K 4.3 08/22/2024   CL 99 08/22/2024   CO2 26 08/22/2024   BUN 17 08/22/2024   CREATININE 1.24 08/22/2024   GFR 58.29 (L) 08/22/2024   CALCIUM  9.1 08/22/2024   PROT 6.7 06/19/2024   ALBUMIN  4.2 06/19/2024   LABGLOB 2.5 06/19/2024   BILITOT 0.7 06/19/2024   ALKPHOS 93 06/19/2024   AST 22 06/19/2024   ALT 29 06/19/2024   ANIONGAP 10 07/12/2023   Last lipids Lab Results  Component Value Date   CHOL 169 06/19/2024   HDL 30 (L) 06/19/2024   LDLCALC 66 06/19/2024   LDLDIRECT 92  07/12/2023   TRIG 470 (H) 06/19/2024   CHOLHDL 5.6 (H) 06/19/2024   Last hemoglobin A1c Lab Results  Component Value Date   HGBA1C 10.0 (A) 12/07/2024   HGBA1C 10.0 12/07/2024   HGBA1C 10.0 (A) 12/07/2024   HGBA1C 10.0 (A) 12/07/2024   IMPRESSION AND PLAN:  #1 diabetes with nephropathy. Poor control: Hemoglobin A1c today is 10%. He has historically been resistant to medication treatment. We discussed this again today, outlining the risks of complications from uncontrolled diabetes. He wants to go ahead and try insulin  now: Novolin  70/30, 15 units in the morning and 8 units in the evening. Feet exam normal today. Monitor renal function today. Urine microalbumin/creatinine ordered  today.  2.  Chronic renal insufficiency stage IIIa. Avoid NSAIDs. Working on glucose control. Blood pressure normal. Basic metabolic panel today.  #3 mixed hyperlipidemia. Approximately 5 months ago his lipid panel showed triglycerides 470, HDL 30, LDL 66.  He is on 40 mg Crestor  daily. Working on improving diabetic diet and glucose control. Hopefully this will help his triglycerides. Plan recheck lipid panel 3 to 6 months. Consider adding fibrate.  An After Visit Summary was printed and given to the patient.  FOLLOW UP: Return in about 3 weeks (around 12/28/2024) for f/u DM.  Signed:  Gerlene Hockey, MD           12/07/2024     [1]  Allergies Allergen Reactions   Atorvastatin  Other (See Comments)    myalgias   Simvastatin  Other (See Comments)    Myalgias/arthralgias

## 2024-12-28 ENCOUNTER — Encounter: Payer: Self-pay | Admitting: Family Medicine

## 2024-12-28 ENCOUNTER — Ambulatory Visit: Admitting: Family Medicine

## 2024-12-28 VITALS — BP 123/72 | HR 55 | Temp 98.4°F | Ht 69.0 in | Wt 186.6 lb

## 2024-12-28 DIAGNOSIS — Z794 Long term (current) use of insulin: Secondary | ICD-10-CM

## 2024-12-28 DIAGNOSIS — E1121 Type 2 diabetes mellitus with diabetic nephropathy: Secondary | ICD-10-CM | POA: Diagnosis not present

## 2024-12-28 MED ORDER — NOVOLIN 70/30 FLEXPEN (70-30) 100 UNIT/ML ~~LOC~~ SUPN: PEN_INJECTOR | SUBCUTANEOUS | Status: DC

## 2024-12-28 NOTE — Progress Notes (Signed)
 OFFICE VISIT  12/28/2024  CC:  Chief Complaint  Patient presents with   Medical Management of Chronic Issues    3 week f/u diabetes   Patient is a 74 y.o. male who presents for 3 week follow-up diabetes A/P as of last visit: #1 diabetes with nephropathy. Poor control: Hemoglobin A1c today is 10%. He has historically been resistant to medication treatment. We discussed this again today, outlining the risks of complications from uncontrolled diabetes. He wants to go ahead and try insulin  now: Novolin  70/30, 15 units in the morning and 8 units in the evening. Feet exam normal today. Monitor renal function today. Urine microalbumin/creatinine ordered today.   2.  Chronic renal insufficiency stage IIIa. Avoid NSAIDs. Working on glucose control. Blood pressure normal. Basic metabolic panel today.   #3 mixed hyperlipidemia. Approximately 5 months ago his lipid panel showed triglycerides 470, HDL 30, LDL 66.  He is on 40 mg Crestor  daily. Working on improving diabetic diet and glucose control. Hopefully this will help his triglycerides. Plan recheck lipid panel 3 to 6 months. Consider adding fibrate.  INTERIM HX: Adrian Foley feels well. Over the last 5 days his glucoses have begun to improve. Morning numbers have been in the range of 180-280.  However, the last several days they have consistently been in the 167-190 range. Evening glucoses remain an average around 240 but the last several days there have been a couple readings in the 170s to 180s.   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 declined insulin  08/2024   Kidney stones    Mixed hyperlipidemia    NSTEMI (non-ST elevated myocardial infarction) (HCC)    Seizures (HCC)    post-traumatic (pediatric)  initially, then was medicated for a few years, then was weened off med and has been seizure-free since.   STEMI (ST elevation myocardial infarction) (HCC)    06/2023, angioplasty, stent    Past Surgical History:  Procedure Laterality Date   CARDIAC CATHETERIZATION N/A 12/23/2015   Procedure: Left Heart Cath and Coronary Angiography;  Surgeon: Alm LELON Clay, MD;  Location: [facility] INVASIVE CV LAB;  Service: Cardiovascular;  Laterality: N/A;  any available cath attending   CORONARY ANGIOPLASTY WITH STENT PLACEMENT     approx age 31 per pt report   CORONARY ARTERY BYPASS GRAFT N/A 12/29/2015   Procedure: CORONARY ARTERY BYPASS GRAFTING (CABG) x four,  using left internal mammary artery and right leg greater saphenous vein harvested endoscopically;  Surgeon: Maude Fleeta Ochoa, MD;  Location: [facility] OR;  Service: Open Heart Surgery;  Laterality: N/A;   CORONARY THROMBECTOMY N/A 07/11/2023   Procedure: Coronary Thrombectomy;  Surgeon: Clay Alm LELON, MD;  Location: [facility] INVASIVE CV LAB;  Service: Cardiovascular;  Laterality: N/A;   CORONARY/GRAFT ACUTE MI REVASCULARIZATION N/A 07/11/2023   Procedure: Coronary/Graft Acute MI Revascularization;  Surgeon: Clay Alm LELON, MD;  Location: [facility] INVASIVE CV LAB;  Service: Cardiovascular;  Laterality: N/A;   INGUINAL HERNIA REPAIR Right 07/19/2018   Procedure: RIGHT HERNIA REPAIR INCARCERATED INGUINAL ADULT;  Surgeon: Teresa Lonni HERO, MD;  Location: [facility] OR;  Service: General;  Laterality: Right;   INSERTION OF MESH Right 07/19/2018   Procedure: INSERTION OF MESH;  Surgeon: Teresa Lonni HERO, MD;  Location: [facility] OR;  Service:  General;  Laterality: Right;   LEFT HEART CATH AND CORS/GRAFTS ANGIOGRAPHY N/A 07/11/2023   Procedure: LEFT HEART CATH AND CORS/GRAFTS ANGIOGRAPHY;  Surgeon: Anner Alm ORN, MD;  Location: [facility] INVASIVE CV LAB;  Service: Cardiovascular;  Laterality: N/A;   TEE WITHOUT CARDIOVERSION N/A 12/29/2015   Procedure: TRANSESOPHAGEAL ECHOCARDIOGRAM (TEE);   Surgeon: Maude Fleeta Ochoa, MD;  Location: [facility] OR;  Service: Open Heart Surgery;  Laterality: N/A;    Outpatient Medications Prior to Visit  Medication Sig Dispense Refill   aspirin  81 MG tablet Take 81 mg by mouth daily.     nitroGLYCERIN  (NITROSTAT ) 0.4 MG SL tablet Place 1 tablet (0.4 mg total) under the tongue every 5 (five) minutes as needed for chest pain. 15 tablet 1   Omega-3 Fatty Acids (FISH OIL ) 1000 MG CAPS Take 2 capsules (2,000 mg total) by mouth in the morning and at bedtime.     rosuvastatin  (CRESTOR ) 40 MG tablet TAKE 1 TABLET BY MOUTH EVERY DAY 90 tablet 2   insulin  isophane & regular human KwikPen (NOVOLIN  70/30 KWIKPEN) (70-30) 100 UNIT/ML KwikPen 15 Units SQ qAM and 8 Units SQ at supper 15 mL 3   pantoprazole  (PROTONIX ) 40 MG tablet Take 1 tablet (40 mg total) by mouth daily. (Patient not taking: Reported on 12/28/2024) 30 tablet 3   No facility-administered medications prior to visit.    Allergies[1]  Review of Systems As per HPI  PE:    12/28/2024    1:17 PM 12/07/2024    2:37 PM 09/07/2024    1:07 PM  Vitals with BMI  Height 5' 9 5' 9 5' 9  Weight 186 lbs 10 oz 187 lbs 10 oz 186 lbs 9 oz  BMI 27.54 27.69 27.54  Systolic 123 130 873  Diastolic 72 75 74  Pulse 55 56 57     Physical Exam  Gen: Alert, well appearing.  Patient is oriented to person, place, time, and situation. AFFECT: pleasant, lucid thought and speech. No further exam today  LABS:  Last CBC Lab Results  Component Value Date   WBC 5.9 10/24/2023   HGB 17.5 10/24/2023   HCT 53.8 (H) 10/24/2023   MCV 93 10/24/2023   MCH 30.3 10/24/2023   RDW 13.2 10/24/2023   PLT 204 10/24/2023   Last metabolic panel Lab Results  Component Value Date   GLUCOSE 413 (H) 08/22/2024   NA 135 08/22/2024   K 4.3 08/22/2024   CL 99 08/22/2024   CO2 26 08/22/2024   BUN 17 08/22/2024   CREATININE 1.24 08/22/2024   GFR 58.29 (L) 08/22/2024   CALCIUM  9.1 08/22/2024   PROT 6.7 06/19/2024   ALBUMIN   4.2 06/19/2024   LABGLOB 2.5 06/19/2024   BILITOT 0.7 06/19/2024   ALKPHOS 93 06/19/2024   AST 22 06/19/2024   ALT 29 06/19/2024   ANIONGAP 10 07/12/2023   Last lipids Lab Results  Component Value Date   CHOL 169 06/19/2024   HDL 30 (L) 06/19/2024   LDLCALC 66 06/19/2024   LDLDIRECT 92 07/12/2023   TRIG 470 (H) 06/19/2024   CHOLHDL 5.6 (H) 06/19/2024   Last hemoglobin A1c Lab Results  Component Value Date   HGBA1C 10.0 (A) 12/07/2024   HGBA1C 10.0 12/07/2024   HGBA1C 10.0 (A) 12/07/2024   HGBA1C 10.0 (A) 12/07/2024   IMPRESSION AND PLAN:  Diabetes with nephropathy, poorly controlled. He is doing well with recent start of insulin , glucose is improving some. Will increase morning 70/30 to 20 units and increase  evening dose to 10 units. Follow-up 1 month. Next A1c after 03/07/25.  An After Visit Summary was printed and given to the patient.  FOLLOW UP: Return in about 4 weeks (around 01/25/2025) for f/u DM.  Signed:  Gerlene Hockey, MD           12/28/2024     [1]  Allergies Allergen Reactions   Atorvastatin  Other (See Comments)    myalgias   Simvastatin  Other (See Comments)    Myalgias/arthralgias

## 2024-12-28 NOTE — Patient Instructions (Signed)
 Increase your morning insulin  to 20 Units and increase your evening insulin  to 10 Units.  Continue to monitor sugar twice a day, write numbers down like you have done. Bring these in for next follow up in 1 month.

## 2025-01-07 ENCOUNTER — Other Ambulatory Visit: Payer: Self-pay | Admitting: Cardiovascular Disease

## 2025-01-16 ENCOUNTER — Ambulatory Visit (INDEPENDENT_AMBULATORY_CARE_PROVIDER_SITE_OTHER)

## 2025-01-16 VITALS — Ht 69.0 in | Wt 186.0 lb

## 2025-01-16 DIAGNOSIS — Z0000 Encounter for general adult medical examination without abnormal findings: Secondary | ICD-10-CM | POA: Diagnosis not present

## 2025-01-16 NOTE — Progress Notes (Signed)
 "  Chief Complaint  Patient presents with   Medicare Wellness     Subjective:   Adrian Foley is a 74 y.o. male who presents for a Medicare Annual Wellness Visit.  Visit info / Clinical Intake: Medicare Wellness Visit Type:: Subsequent Annual Wellness Visit Persons participating in visit and providing information:: patient Medicare Wellness Visit Mode:: Telephone If telephone:: video declined Since this visit was completed virtually, some vitals may be partially provided or unavailable. Missing vitals are due to the limitations of the virtual format.: Unable to obtain vitals - no equipment If Telephone or Video please confirm:: I connected with patient using audio/video enable telemedicine. I verified patient identity with two identifiers, discussed telehealth limitations, and patient agreed to proceed. Patient Location:: Home Provider Location:: Office Interpreter Needed?: No Pre-visit prep was completed: yes AWV questionnaire completed by patient prior to visit?: no Living arrangements:: lives with spouse/significant other [name] Overall Health Status Rating: very good Typical amount of pain: none Does pain affect daily life?: no Are you currently prescribed opioids?: no  Dietary Habits and Nutritional Risks How many meals a day?: 3 Eats fruit and vegetables daily?: yes Most meals are obtained by: preparing own meals In the last 2 weeks, have you had any of the following?: none Diabetic:: (!) yes Any non-healing wounds?: no How often do you check your BS?: 3 Would you like to be referred to a Nutritionist or for Diabetic Management? : no  Functional Status Activities of Daily Living (to include ambulation/medication): Independent Ambulation: Independent Medication Administration: Independent Home Management (perform basic housework or laundry): Independent Manage your own finances?: yes Primary transportation is: driving Concerns about vision?: no *vision screening is  required for WTM* Concerns about hearing?: (!) yes (has some hearing loss) Uses hearing aids?: no Hear whispered voice?: (!) no *in-person visit only*  Fall Screening Falls in the past year?: 0 Number of falls in past year: 0 Was there an injury with Fall?: 0 Fall Risk Category Calculator: 0 Patient Fall Risk Level: Low Fall Risk  Fall Risk Patient at Risk for Falls Due to: No Fall Risks Fall risk Follow up: Falls evaluation completed; Falls prevention discussed  Home and Transportation Safety: All rugs have non-skid backing?: N/A, no rugs All stairs or steps have railings?: N/A, no stairs Grab bars in the bathtub or shower?: (!) no Have non-skid surface in bathtub or shower?: yes Good home lighting?: yes Regular seat belt use?: yes Hospital stays in the last year:: no  Cognitive Assessment Difficulty concentrating, remembering, or making decisions? : no Will 6CIT or Mini Cog be Completed: no 6CIT or Mini Cog Declined: patient alert, oriented, able to answer questions appropriately and recall recent events  Advance Directives (For Healthcare) Does Patient Have a Medical Advance Directive?: No  Reviewed/Updated  Reviewed/Updated: Reviewed All (Medical, Surgical, Family, Medications, Allergies, Care Teams, Patient Goals)    Allergies (verified) Atorvastatin  and Simvastatin    Current Medications (verified) Outpatient Encounter Medications as of 01/16/2025  Medication Sig   aspirin  81 MG tablet Take 81 mg by mouth daily.   insulin  isophane & regular human KwikPen (NOVOLIN  70/30 KWIKPEN) (70-30) 100 UNIT/ML KwikPen 20 Units SQ qAM and 10 Units SQ at supper   nitroGLYCERIN  (NITROSTAT ) 0.4 MG SL tablet Place 1 tablet (0.4 mg total) under the tongue every 5 (five) minutes as needed for chest pain.   Omega-3 Fatty Acids (FISH OIL ) 1000 MG CAPS Take 2 capsules (2,000 mg total) by mouth in the morning and at bedtime.  rosuvastatin  (CRESTOR ) 40 MG tablet TAKE 1 TABLET BY MOUTH  EVERY DAY   No facility-administered encounter medications on file as of 01/16/2025.    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 declined insulin  08/2024   Kidney stones    Mixed hyperlipidemia    NSTEMI (non-ST elevated myocardial infarction) (HCC)    Seizures (HCC)    post-traumatic (pediatric) initially, then was medicated for a few years, then was weened off med and has been seizure-free since.   STEMI (ST elevation myocardial infarction) (HCC)    06/2023, angioplasty, stent   Past Surgical History:  Procedure Laterality Date   CARDIAC CATHETERIZATION N/A 12/23/2015   Procedure: Left Heart Cath and Coronary Angiography;  Surgeon: Alm LELON Clay, MD;  Location: [facility] INVASIVE CV LAB;  Service: Cardiovascular;  Laterality: N/A;  any available cath attending   CORONARY ANGIOPLASTY WITH STENT PLACEMENT     approx age 70 per pt report   CORONARY ARTERY BYPASS GRAFT N/A 12/29/2015   Procedure: CORONARY ARTERY BYPASS GRAFTING (CABG) x four,  using left internal mammary artery and right leg greater saphenous vein harvested endoscopically;  Surgeon: Maude Fleeta Ochoa, MD;  Location: [facility] OR;  Service: Open Heart Surgery;  Laterality: N/A;   CORONARY THROMBECTOMY N/A 07/11/2023   Procedure: Coronary Thrombectomy;  Surgeon: Clay Alm LELON, MD;  Location: [facility] INVASIVE CV LAB;  Service: Cardiovascular;  Laterality: N/A;   CORONARY/GRAFT ACUTE MI REVASCULARIZATION N/A 07/11/2023   Procedure: Coronary/Graft Acute MI Revascularization;  Surgeon: Clay Alm LELON, MD;  Location: [facility] INVASIVE CV LAB;  Service: Cardiovascular;  Laterality: N/A;   INGUINAL HERNIA REPAIR Right 07/19/2018   Procedure: RIGHT HERNIA REPAIR INCARCERATED INGUINAL ADULT;  Surgeon: Teresa Lonni HERO, MD;   Location: [facility] OR;  Service: General;  Laterality: Right;   INSERTION OF MESH Right 07/19/2018   Procedure: INSERTION OF MESH;  Surgeon: Teresa Lonni HERO, MD;  Location: [facility] OR;  Service: General;  Laterality: Right;   LEFT HEART CATH AND CORS/GRAFTS ANGIOGRAPHY N/A 07/11/2023   Procedure: LEFT HEART CATH AND CORS/GRAFTS ANGIOGRAPHY;  Surgeon: Clay Alm LELON, MD;  Location: [facility] INVASIVE CV LAB;  Service: Cardiovascular;  Laterality: N/A;   TEE WITHOUT CARDIOVERSION N/A 12/29/2015   Procedure: TRANSESOPHAGEAL ECHOCARDIOGRAM (TEE);  Surgeon: Maude Fleeta Ochoa, MD;  Location: [facility] OR;  Service: Open Heart Surgery;  Laterality: N/A;   Family History  Problem Relation Age of Onset   Early death Mother    Alcohol abuse Father    Early death Father    Depression Sister    Arthritis Sister    Early death Sister    Heart disease Maternal Aunt    Heart disease Maternal Uncle    Social History   Occupational History   Occupation: [employer] RET[employer]  Tobacco Use   Smoking status: Former   Smokeless tobacco: Former    Types: Chew    Quit date: 05/23/2015  Vaping Use   Vaping status: Never Used  Substance and Sexual Activity   Alcohol use: No   Drug use: No   Sexual activity: Yes   Tobacco Counseling Counseling given: Not Answered  SDOH Screenings   Food Insecurity: No Food Insecurity (01/16/2025)  Housing: Unknown (01/16/2025)  Transportation Needs: No Transportation Needs (  01/16/2025)  Utilities: Not At Risk (01/16/2025)  Depression (PHQ2-9): Low Risk (01/16/2025)  Financial Resource Strain: Low Risk (06/08/2023)  Physical Activity: Sufficiently Active (01/16/2025)  Social Connections: Socially Isolated (01/16/2025)  Stress: No Stress Concern Present (01/16/2025)  Tobacco Use: Medium Risk (01/16/2025)  Health Literacy: Adequate Health Literacy (01/16/2025)   See flowsheets for full screening details  Depression Screen PHQ 2 & 9 Depression Scale- Over the past 2 weeks, how often have you been  bothered by any of the following problems? Little interest or pleasure in doing things: 0 Feeling down, depressed, or hopeless (PHQ Adolescent also includes...irritable): 0 PHQ-2 Total Score: 0 Trouble falling or staying asleep, or sleeping too much: 0 Feeling tired or having little energy: 0 Poor appetite or overeating (PHQ Adolescent also includes...weight loss): 0 Feeling bad about yourself - or that you are a failure or have let yourself or your family down: 0 Trouble concentrating on things, such as reading the newspaper or watching television (PHQ Adolescent also includes...like school work): 0 Moving or speaking so slowly that other people could have noticed. Or the opposite - being so fidgety or restless that you have been moving around a lot more than usual: 0 Thoughts that you would be better off dead, or of hurting yourself in some way: 0 PHQ-9 Total Score: 0 If you checked off any problems, how difficult have these problems made it for you to do your work, take care of things at home, or get along with other people?: Not difficult at all     Goals Addressed   None          Objective:    Today's Vitals   01/16/25 1543  Weight: 186 lb (84.4 kg)  Height: 5' 9 (1.753 m)   Body mass index is 27.47 kg/m.  Hearing/Vision screen Hearing Screening - Comments:: Has hearing loss-per pt Vision Screening - Comments:: Denies vision issues./not UTD/ no provider  Immunizations and Health Maintenance Health Maintenance  Topic Date Due   Diabetic kidney evaluation - Urine ACR  Never done   Hepatitis C Screening  Never done   OPHTHALMOLOGY EXAM  12/20/2014   Influenza Vaccine  03/19/2025 (Originally 07/20/2024)   Pneumococcal Vaccine: 50+ Years (1 of 2 - PCV) 12/07/2025 (Originally 08/21/1970)   Colonoscopy  01/19/2026 (Originally 08/20/1997)   HEMOGLOBIN A1C  06/07/2025   Diabetic kidney evaluation - eGFR measurement  08/22/2025   FOOT EXAM  12/07/2025   Medicare Annual Wellness  (AWV)  01/16/2026   Meningococcal B Vaccine  Aged Out   DTaP/Tdap/Td  Discontinued   COVID-19 Vaccine  Discontinued   Zoster Vaccines- Shingrix  Discontinued        Assessment/Plan:  This is a routine wellness examination for Adrian Foley.  Patient Care Team: Candise Aleene DEL, MD as PCP - General (Family Medicine) Raford Riggs, MD as PCP - Cardiology (Cardiology)  I have personally reviewed and noted the following in the patients chart:   Medical and social history Use of alcohol, tobacco or illicit drugs  Current medications and supplements including opioid prescriptions. Functional ability and status Nutritional status Physical activity Advanced directives List of other physicians Hospitalizations, surgeries, and ER visits in previous 12 months Vitals Screenings to include cognitive, depression, and falls Referrals and appointments  No orders of the defined types were placed in this encounter.  In addition, I have reviewed and discussed with patient certain preventive protocols, quality metrics, and best practice recommendations. A written personalized care plan for preventive services as  well as general preventive health recommendations were provided to patient.   Liam Bossman L Amogh Komatsu, CMA   01/16/2025   Return in 1 year (on 01/16/2026).  After Visit Summary: (Mail) Due to this being a telephonic visit, the after visit summary with patients personalized plan was offered to patient via mail   Nurse Notes: Patient is due for a Hep C screening and a UACR, which can be done during his up coming office visit.  Patient also is due for a colonoscopy and would like to discuss during next office visit.  He declines all due vaccines.   "

## 2025-01-16 NOTE — Patient Instructions (Addendum)
 Adrian Foley,  Thank you for taking the time for your Medicare Wellness Visit. I appreciate your continued commitment to your health goals. Please review the care plan we discussed, and feel free to reach out if I can assist you further.  Please note that Annual Wellness Visits do not include a physical exam. Some assessments may be limited, especially if the visit was conducted virtually. If needed, we may recommend an in-person follow-up with your provider.  Ongoing Care Seeing your primary care provider every 3 to 6 months helps us  monitor your health and provide consistent, personalized care. Next office visit on 01/25/2025.  You are due for a diabetic eye exam.  You are also due for a kidney evaluation and a Hep C screening.  These will be done during your next office visit.    Referrals If a referral was made during today's visit and you haven't received any updates within two weeks, please contact the referred provider directly to check on the status.  There are several Eye Doctors in your area. Here are a few that usually accept all insurance types:  [facility] [address] [place], [place] [address] Phone: [phone]  [facility] [address] [place], [place] [address] Phone: [phone]  [facility] [address] [place], [place] [address] Phone: [phone]  [facility] [address] [facility] [place], [place] [address] Phone: [phone]  [facility] [address] [place], [place] [address] Phone: [phone]  Please let us  know if you require a referral for an eye exam appointment. Thank you!   Recommended Screenings:  Health Maintenance  Topic Date Due   Kidney health urinalysis for diabetes  Never done   Hepatitis C Screening  Never done   Eye exam for diabetics  12/20/2014   Medicare Annual Wellness Visit  06/07/2024   Flu Shot  03/19/2025*   Pneumococcal Vaccine for age over 54 (1 of 2 - PCV) 12/07/2025*    Colon Cancer Screening  01/19/2026*   Hemoglobin A1C  06/07/2025   Yearly kidney function blood test for diabetes  08/22/2025   Complete foot exam   12/07/2025   Meningitis B Vaccine  Aged Out   DTaP/Tdap/Td vaccine  Discontinued   COVID-19 Vaccine  Discontinued   Zoster (Shingles) Vaccine  Discontinued  *Topic was postponed. The date shown is not the original due date.       01/16/2025    3:46 PM  Advanced Directives  Does Patient Have a Medical Advance Directive? No    Vision: Annual vision screenings are recommended for early detection of glaucoma, cataracts, and diabetic retinopathy. These exams can also reveal signs of chronic conditions such as diabetes and high blood pressure.  Dental: Annual dental screenings help detect early signs of oral cancer, gum disease, and other conditions linked to overall health, including heart disease and diabetes.  Please see the attached documents for additional preventive care recommendations.

## 2025-01-25 ENCOUNTER — Encounter: Payer: Self-pay | Admitting: Family Medicine

## 2025-01-25 ENCOUNTER — Ambulatory Visit: Admitting: Family Medicine

## 2025-01-25 VITALS — BP 132/77 | HR 58 | Temp 98.0°F | Ht 69.0 in | Wt 190.4 lb

## 2025-01-25 DIAGNOSIS — E1121 Type 2 diabetes mellitus with diabetic nephropathy: Secondary | ICD-10-CM

## 2025-01-25 MED ORDER — NOVOLIN 70/30 FLEXPEN (70-30) 100 UNIT/ML ~~LOC~~ SUPN: PEN_INJECTOR | SUBCUTANEOUS | 3 refills | Status: AC

## 2025-01-25 NOTE — Progress Notes (Signed)
 OFFICE VISIT  01/25/2025  CC:  Chief Complaint  Patient presents with   Medical Management of Chronic Issues    Pt brought glucose log with insulin  amounts given    Patient is a 74 y.o. male who presents for 1 month follow-up diabetes. A/P as of last visit: Diabetes with nephropathy, poorly controlled. He is doing well with recent start of insulin , glucose is improving some. Will increase morning 70/30 to 20 units and increase evening dose to 10 units. Follow-up 1 month. Next A1c after 03/07/25.  INTERIM HX: Adrian Foley feels well.  Doing fine with the insulin .  No hypoglycemia. He feels like he is making some improvements in his carb intake. He walks a mile and a half every morning, also does some part-time carpentry work.  Fasting glucoses ranging anywhere from 140s to 190. Presupper glucoses are in a similar range.  His glucose about 4 hours after eating supper typically is 170-200s  No new concerns.  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 declined insulin  08/2024   Kidney stones    Mixed hyperlipidemia    NSTEMI (non-ST elevated myocardial infarction) (HCC)    Seizures (HCC)    post-traumatic (pediatric) initially, then was medicated for a few years, then was weened off med and has been seizure-free since.   STEMI (ST elevation myocardial infarction) (HCC)    06/2023, angioplasty, stent    Past Surgical History:  Procedure Laterality Date   CARDIAC CATHETERIZATION N/A 12/23/2015   Procedure: Left Heart Cath and Coronary Angiography;  Surgeon: Alm LELON Clay, MD;  Location: [facility] INVASIVE CV LAB;  Service: Cardiovascular;  Laterality: N/A;  any available cath attending   CORONARY ANGIOPLASTY WITH STENT PLACEMENT     approx age 29 per pt report   CORONARY  ARTERY BYPASS GRAFT N/A 12/29/2015   Procedure: CORONARY ARTERY BYPASS GRAFTING (CABG) x four,  using left internal mammary artery and right leg greater saphenous vein harvested endoscopically;  Surgeon: Maude Fleeta Ochoa, MD;  Location: [facility] OR;  Service: Open Heart Surgery;  Laterality: N/A;   CORONARY THROMBECTOMY N/A 07/11/2023   Procedure: Coronary Thrombectomy;  Surgeon: Clay Alm LELON, MD;  Location: [facility] INVASIVE CV LAB;  Service: Cardiovascular;  Laterality: N/A;   CORONARY/GRAFT ACUTE MI REVASCULARIZATION N/A 07/11/2023   Procedure: Coronary/Graft Acute MI Revascularization;  Surgeon: Clay Alm LELON, MD;  Location: [facility] INVASIVE CV LAB;  Service: Cardiovascular;  Laterality: N/A;   INGUINAL HERNIA REPAIR Right 07/19/2018   Procedure: RIGHT HERNIA REPAIR INCARCERATED INGUINAL ADULT;  Surgeon: Teresa Lonni HERO, MD;  Location: [facility] OR;  Service: General;  Laterality: Right;   INSERTION OF MESH Right 07/19/2018   Procedure: INSERTION OF MESH;  Surgeon: Teresa Lonni HERO, MD;  Location: [facility] OR;  Service: General;  Laterality: Right;   LEFT HEART CATH AND CORS/GRAFTS ANGIOGRAPHY N/A 07/11/2023   Procedure: LEFT HEART CATH AND CORS/GRAFTS ANGIOGRAPHY;  Surgeon: Clay Alm LELON, MD;  Location: [facility] INVASIVE CV LAB;  Service: Cardiovascular;  Laterality: N/A;   TEE WITHOUT CARDIOVERSION N/A 12/29/2015   Procedure: TRANSESOPHAGEAL ECHOCARDIOGRAM (TEE);  Surgeon: Maude Fleeta Ochoa, MD;  Location: [facility] OR;  Service: Open Heart Surgery;  Laterality: N/A;    Outpatient Medications Prior to Visit  Medication Sig Dispense Refill  aspirin  81 MG tablet Take 81 mg by mouth daily.     nitroGLYCERIN  (NITROSTAT ) 0.4 MG SL tablet Place 1 tablet (0.4 mg total) under the tongue every 5 (five) minutes as needed for chest pain. 15 tablet 1   Omega-3 Fatty Acids (FISH OIL ) 1000 MG CAPS Take 2 capsules (2,000 mg total) by mouth in the morning and at bedtime.     rosuvastatin  (CRESTOR ) 40 MG tablet TAKE 1 TABLET BY MOUTH EVERY  DAY 90 tablet 2   insulin  isophane & regular human KwikPen (NOVOLIN  70/30 KWIKPEN) (70-30) 100 UNIT/ML KwikPen 20 Units SQ qAM and 10 Units SQ at supper     No facility-administered medications prior to visit.    Allergies[1]  Review of Systems As per HPI  PE:    01/25/2025    9:35 AM 01/16/2025    3:43 PM 12/28/2024    1:17 PM  Vitals with BMI  Height 5' 9 5' 9 5' 9  Weight 190 lbs 6 oz 186 lbs 186 lbs 10 oz  BMI 28.1 27.45 27.54  Systolic 132  123  Diastolic 77  72  Pulse 58  55     Physical Exam  Gen: Alert, well appearing.  Patient is oriented to person, place, time, and situation. AFFECT: pleasant, lucid thought and speech. No further exam today  LABS:    Chemistry      Component Value Date/Time   NA 135 08/22/2024 1633   NA 136 06/19/2024 0907   K 4.3 08/22/2024 1633   CL 99 08/22/2024 1633   CO2 26 08/22/2024 1633   BUN 17 08/22/2024 1633   BUN 16 06/19/2024 0907   CREATININE 1.24 08/22/2024 1633      Component Value Date/Time   CALCIUM  9.1 08/22/2024 1633   ALKPHOS 93 06/19/2024 0907   AST 22 06/19/2024 0907   ALT 29 06/19/2024 0907   BILITOT 0.7 06/19/2024 0907     Lab Results  Component Value Date   HGBA1C 10.0 (A) 12/07/2024   HGBA1C 10.0 12/07/2024   HGBA1C 10.0 (A) 12/07/2024   HGBA1C 10.0 (A) 12/07/2024   Lab Results  Component Value Date   WBC 5.9 10/24/2023   HGB 17.5 10/24/2023   HCT 53.8 (H) 10/24/2023   MCV 93 10/24/2023   PLT 204 10/24/2023   Lab Results  Component Value Date   CHOL 169 06/19/2024   HDL 30 (L) 06/19/2024   LDLCALC 66 06/19/2024   LDLDIRECT 92 07/12/2023   TRIG 470 (H) 06/19/2024   CHOLHDL 5.6 (H) 06/19/2024   IMPRESSION AND PLAN:  Diabetes with nephropathy, poorly controlled but improving. Good glucose monitoring habits. Will increase morning 70/30 to 22 units and increase evening dose to 14 units. F/u 6 weeks Next A1c after 03/07/25 and urine microalbumin/creatinine will be done at that time as  well.  An After Visit Summary was printed and given to the patient.  FOLLOW UP: Return in about 6 weeks (around 03/08/2025) for routine chronic illness f/u.  Signed:  Gerlene Hockey, MD           01/25/2025       [1]  Allergies Allergen Reactions   Atorvastatin  Other (See Comments)    myalgias   Simvastatin  Other (See Comments)    Myalgias/arthralgias

## 2025-03-06 ENCOUNTER — Ambulatory Visit: Admitting: Family Medicine
